# Patient Record
Sex: Male | Born: 1955 | Race: White | Hispanic: No | Marital: Single | Smoking: Current every day smoker
Health system: Southern US, Community
[De-identification: ages and names within clinical notes are randomized; demographics above are authoritative.]

## PROBLEM LIST (undated history)

## (undated) DIAGNOSIS — I10 Essential (primary) hypertension: Secondary | ICD-10-CM

## (undated) DIAGNOSIS — I219 Acute myocardial infarction, unspecified: Secondary | ICD-10-CM

## (undated) DIAGNOSIS — E119 Type 2 diabetes mellitus without complications: Secondary | ICD-10-CM

## (undated) DIAGNOSIS — I509 Heart failure, unspecified: Secondary | ICD-10-CM

## (undated) DIAGNOSIS — J449 Chronic obstructive pulmonary disease, unspecified: Secondary | ICD-10-CM

## (undated) HISTORY — PX: CHOLECYSTECTOMY: SHX55

## (undated) HISTORY — PX: CORONARY ANGIOPLASTY WITH STENT PLACEMENT: SHX49

## (undated) HISTORY — PX: APPENDECTOMY: SHX54

---

## 2004-08-07 ENCOUNTER — Other Ambulatory Visit: Payer: Self-pay

## 2004-08-08 ENCOUNTER — Observation Stay: Payer: Self-pay | Admitting: Internal Medicine

## 2004-08-08 ENCOUNTER — Other Ambulatory Visit: Payer: Self-pay

## 2004-08-16 DIAGNOSIS — I219 Acute myocardial infarction, unspecified: Secondary | ICD-10-CM

## 2004-08-16 HISTORY — DX: Acute myocardial infarction, unspecified: I21.9

## 2005-07-25 ENCOUNTER — Inpatient Hospital Stay: Payer: Self-pay | Admitting: Internal Medicine

## 2005-07-25 ENCOUNTER — Other Ambulatory Visit: Payer: Self-pay

## 2007-12-10 ENCOUNTER — Inpatient Hospital Stay: Payer: Self-pay | Admitting: Internal Medicine

## 2007-12-10 ENCOUNTER — Other Ambulatory Visit: Payer: Self-pay

## 2008-03-30 ENCOUNTER — Emergency Department: Payer: Self-pay | Admitting: Emergency Medicine

## 2008-03-30 ENCOUNTER — Other Ambulatory Visit: Payer: Self-pay

## 2008-04-15 ENCOUNTER — Inpatient Hospital Stay: Payer: Self-pay | Admitting: Surgery

## 2008-04-15 ENCOUNTER — Other Ambulatory Visit: Payer: Self-pay

## 2008-04-23 ENCOUNTER — Other Ambulatory Visit: Payer: Self-pay

## 2008-04-23 ENCOUNTER — Emergency Department: Payer: Self-pay

## 2010-01-08 ENCOUNTER — Emergency Department: Payer: Self-pay | Admitting: Emergency Medicine

## 2010-02-10 ENCOUNTER — Emergency Department: Payer: Self-pay | Admitting: Emergency Medicine

## 2010-04-13 ENCOUNTER — Ambulatory Visit: Payer: Self-pay | Admitting: Family Medicine

## 2010-04-27 ENCOUNTER — Ambulatory Visit: Payer: Self-pay | Admitting: Family Medicine

## 2010-08-03 ENCOUNTER — Ambulatory Visit: Payer: Self-pay | Admitting: Family Medicine

## 2010-09-17 ENCOUNTER — Ambulatory Visit: Payer: Self-pay | Admitting: Family Medicine

## 2010-10-11 ENCOUNTER — Emergency Department: Payer: Self-pay | Admitting: Emergency Medicine

## 2010-12-15 ENCOUNTER — Ambulatory Visit: Payer: Self-pay | Admitting: Internal Medicine

## 2010-12-29 ENCOUNTER — Emergency Department: Payer: Self-pay | Admitting: Emergency Medicine

## 2011-01-12 ENCOUNTER — Ambulatory Visit: Payer: Self-pay | Admitting: Internal Medicine

## 2011-01-15 ENCOUNTER — Ambulatory Visit: Payer: Self-pay | Admitting: Internal Medicine

## 2011-02-07 ENCOUNTER — Emergency Department: Payer: Self-pay | Admitting: Emergency Medicine

## 2011-02-28 ENCOUNTER — Emergency Department: Payer: Self-pay | Admitting: Emergency Medicine

## 2011-05-04 ENCOUNTER — Observation Stay: Payer: Self-pay | Admitting: *Deleted

## 2011-06-15 ENCOUNTER — Ambulatory Visit: Payer: Self-pay | Admitting: Internal Medicine

## 2011-07-13 ENCOUNTER — Emergency Department: Payer: Self-pay | Admitting: Emergency Medicine

## 2011-07-14 ENCOUNTER — Ambulatory Visit: Payer: Self-pay | Admitting: Internal Medicine

## 2011-07-17 ENCOUNTER — Ambulatory Visit: Payer: Self-pay | Admitting: Internal Medicine

## 2011-08-17 ENCOUNTER — Ambulatory Visit: Payer: Self-pay | Admitting: Internal Medicine

## 2011-11-02 ENCOUNTER — Ambulatory Visit: Payer: Self-pay | Admitting: Internal Medicine

## 2011-11-02 LAB — CBC CANCER CENTER
Basophil #: 0.1 x10 3/mm (ref 0.0–0.1)
Basophil %: 0.8 %
Eosinophil %: 1.6 %
HGB: 13.3 g/dL (ref 13.0–18.0)
Lymphocyte #: 3.8 x10 3/mm — ABNORMAL HIGH (ref 1.0–3.6)
MCH: 28.7 pg (ref 26.0–34.0)
MCHC: 33.9 g/dL (ref 32.0–36.0)
MCV: 85 fL (ref 80–100)
Monocyte #: 1.4 x10 3/mm — ABNORMAL HIGH (ref 0.0–0.7)
Monocyte %: 9.6 %
Neutrophil %: 61.1 %

## 2011-11-04 ENCOUNTER — Emergency Department: Payer: Self-pay | Admitting: Emergency Medicine

## 2011-11-04 LAB — COMPREHENSIVE METABOLIC PANEL
Alkaline Phosphatase: 121 U/L (ref 50–136)
BUN: 3 mg/dL — ABNORMAL LOW (ref 7–18)
Calcium, Total: 8.4 mg/dL — ABNORMAL LOW (ref 8.5–10.1)
Chloride: 98 mmol/L (ref 98–107)
Co2: 26 mmol/L (ref 21–32)
EGFR (Non-African Amer.): >60
Potassium: 3.4 mmol/L — ABNORMAL LOW (ref 3.5–5.1)
SGOT(AST): 59 U/L — ABNORMAL HIGH (ref 15–37)
SGPT (ALT): 45 U/L
Total Protein: 7.4 g/dL (ref 6.4–8.2)

## 2011-11-04 LAB — CBC
HCT: 37.2 % — ABNORMAL LOW (ref 40.0–52.0)
MCHC: 33.8 g/dL (ref 32.0–36.0)
Platelet: 251 10*3/uL (ref 150–440)
WBC: 8.9 10*3/uL (ref 3.8–10.6)

## 2011-11-04 LAB — CK TOTAL AND CKMB (NOT AT ARMC)
CK, Total: 87 U/L (ref 35–232)
CK-MB: 0.8 ng/mL (ref 0.5–3.6)

## 2011-11-04 LAB — TROPONIN I: Troponin-I: <0.02 ng/mL

## 2011-11-07 ENCOUNTER — Emergency Department: Payer: Self-pay | Admitting: Emergency Medicine

## 2011-11-07 LAB — COMPREHENSIVE METABOLIC PANEL
Calcium, Total: 8.4 mg/dL — ABNORMAL LOW (ref 8.5–10.1)
Chloride: 97 mmol/L — ABNORMAL LOW (ref 98–107)
Co2: 27 mmol/L (ref 21–32)
EGFR (African American): >60
EGFR (Non-African Amer.): >60
Potassium: 3.6 mmol/L (ref 3.5–5.1)
SGOT(AST): 49 U/L — ABNORMAL HIGH (ref 15–37)
SGPT (ALT): 46 U/L

## 2011-11-07 LAB — CBC
HCT: 40.7 % (ref 40.0–52.0)
HGB: 13.6 g/dL (ref 13.0–18.0)
MCV: 85 fL (ref 80–100)
RBC: 4.78 10*6/uL (ref 4.40–5.90)
RDW: 15 % — ABNORMAL HIGH (ref 11.5–14.5)
WBC: 14.5 10*3/uL — ABNORMAL HIGH (ref 3.8–10.6)

## 2011-11-07 LAB — CK TOTAL AND CKMB (NOT AT ARMC)
CK, Total: 65 U/L (ref 35–232)
CK-MB: 0.9 ng/mL (ref 0.5–3.6)

## 2011-11-07 LAB — PRO B NATRIURETIC PEPTIDE: B-Type Natriuretic Peptide: 43 pg/mL (ref 0–125)

## 2011-11-08 ENCOUNTER — Emergency Department: Payer: Self-pay | Admitting: Emergency Medicine

## 2011-11-15 ENCOUNTER — Ambulatory Visit: Payer: Self-pay | Admitting: Internal Medicine

## 2011-11-21 ENCOUNTER — Emergency Department: Payer: Self-pay | Admitting: Emergency Medicine

## 2011-11-21 LAB — COMPREHENSIVE METABOLIC PANEL
Anion Gap: 11 (ref 7–16)
Bilirubin,Total: 0.3 mg/dL (ref 0.2–1.0)
Calcium, Total: 8.5 mg/dL (ref 8.5–10.1)
Co2: 26 mmol/L (ref 21–32)
Creatinine: 0.78 mg/dL (ref 0.60–1.30)
EGFR (Non-African Amer.): >60
Osmolality: 272 (ref 275–301)
Potassium: 3.5 mmol/L (ref 3.5–5.1)
SGOT(AST): 58 U/L — ABNORMAL HIGH (ref 15–37)
Sodium: 136 mmol/L (ref 136–145)

## 2011-11-21 LAB — CK TOTAL AND CKMB (NOT AT ARMC)
CK, Total: 85 U/L (ref 35–232)
CK-MB: 0.6 ng/mL (ref 0.5–3.6)

## 2011-11-21 LAB — CBC WITH DIFFERENTIAL/PLATELET
Eosinophil #: 0.4 10*3/uL (ref 0.0–0.7)
Eosinophil %: 2.3 %
HCT: 38.4 % — ABNORMAL LOW (ref 40.0–52.0)
HGB: 12.9 g/dL — ABNORMAL LOW (ref 13.0–18.0)
Lymphocyte #: 4.5 10*3/uL — ABNORMAL HIGH (ref 1.0–3.6)
Lymphocyte %: 29.9 %
MCH: 28.7 pg (ref 26.0–34.0)
MCV: 85 fL (ref 80–100)
Monocyte %: 11.1 %
Neutrophil #: 8.5 10*3/uL — ABNORMAL HIGH (ref 1.4–6.5)
Neutrophil %: 56.1 %
Platelet: 286 10*3/uL (ref 150–440)
RBC: 4.5 10*6/uL (ref 4.40–5.90)
RDW: 14.6 % — ABNORMAL HIGH (ref 11.5–14.5)

## 2012-01-03 ENCOUNTER — Emergency Department: Payer: Self-pay | Admitting: *Deleted

## 2012-01-03 LAB — CBC
HCT: 41.8 % (ref 40.0–52.0)
HGB: 13.7 g/dL (ref 13.0–18.0)
MCHC: 32.7 g/dL (ref 32.0–36.0)
MCV: 86 fL (ref 80–100)
Platelet: 386 10*3/uL (ref 150–440)
RDW: 14.7 % — ABNORMAL HIGH (ref 11.5–14.5)
WBC: 22.6 10*3/uL — ABNORMAL HIGH (ref 3.8–10.6)

## 2012-01-03 LAB — BASIC METABOLIC PANEL
Anion Gap: 8 (ref 7–16)
BUN: 5 mg/dL — ABNORMAL LOW (ref 7–18)
Chloride: 107 mmol/L (ref 98–107)
Co2: 25 mmol/L (ref 21–32)
Creatinine: 0.93 mg/dL (ref 0.60–1.30)
Glucose: 127 mg/dL — ABNORMAL HIGH (ref 65–99)
Sodium: 140 mmol/L (ref 136–145)

## 2012-01-03 LAB — TROPONIN I: Troponin-I: <0.02 ng/mL

## 2012-01-03 LAB — CK TOTAL AND CKMB (NOT AT ARMC): CK-MB: 0.6 ng/mL (ref 0.5–3.6)

## 2012-01-21 ENCOUNTER — Emergency Department: Payer: Self-pay | Admitting: Emergency Medicine

## 2012-01-21 LAB — URINALYSIS, COMPLETE
Bacteria: NONE SEEN
Bilirubin,UR: NEGATIVE
Blood: NEGATIVE
Glucose,UR: NEGATIVE mg/dL
Ketone: NEGATIVE
Leukocyte Esterase: NEGATIVE
Nitrite: NEGATIVE
Ph: 6
Protein: NEGATIVE
RBC,UR: 1 /HPF
Specific Gravity: 1.001
Squamous Epithelial: NONE SEEN
WBC UR: NONE SEEN /HPF

## 2012-01-21 LAB — CBC WITH DIFFERENTIAL/PLATELET
Basophil %: 0.8 %
Eosinophil %: 1.2 %
Lymphocyte %: 26.5 %
MCHC: 33.7 g/dL (ref 32.0–36.0)
Monocyte #: 1 x10 3/mm (ref 0.2–1.0)
Neutrophil #: 9.4 10*3/uL — ABNORMAL HIGH (ref 1.4–6.5)
Neutrophil %: 64.7 %
Platelet: 353 10*3/uL (ref 150–440)
RBC: 4.79 10*6/uL (ref 4.40–5.90)
RDW: 14.4 % (ref 11.5–14.5)
WBC: 14.6 10*3/uL — ABNORMAL HIGH (ref 3.8–10.6)

## 2012-01-21 LAB — BASIC METABOLIC PANEL
Anion Gap: 13 (ref 7–16)
BUN: 2 mg/dL — ABNORMAL LOW (ref 7–18)
Chloride: 102 mmol/L (ref 98–107)
Co2: 22 mmol/L (ref 21–32)
Creatinine: 0.79 mg/dL (ref 0.60–1.30)
EGFR (African American): >60
EGFR (Non-African Amer.): >60
Osmolality: 272 (ref 275–301)
Potassium: 4 mmol/L (ref 3.5–5.1)
Sodium: 137 mmol/L (ref 136–145)

## 2012-06-14 ENCOUNTER — Emergency Department: Payer: Self-pay | Admitting: Unknown Physician Specialty

## 2012-06-14 LAB — COMPREHENSIVE METABOLIC PANEL
Albumin: 3.9 g/dL (ref 3.4–5.0)
Alkaline Phosphatase: 104 U/L (ref 50–136)
Anion Gap: 14 (ref 7–16)
BUN: 4 mg/dL — ABNORMAL LOW (ref 7–18)
Bilirubin,Total: 0.4 mg/dL (ref 0.2–1.0)
Calcium, Total: 8.3 mg/dL — ABNORMAL LOW (ref 8.5–10.1)
Chloride: 93 mmol/L — ABNORMAL LOW (ref 98–107)
Co2: 21 mmol/L (ref 21–32)
Creatinine: 0.7 mg/dL (ref 0.60–1.30)
EGFR (African American): >60
EGFR (Non-African Amer.): >60
Glucose: 164 mg/dL — ABNORMAL HIGH (ref 65–99)
Osmolality: 258 (ref 275–301)
Potassium: 3.2 mmol/L — ABNORMAL LOW (ref 3.5–5.1)
SGOT(AST): 29 U/L (ref 15–37)
SGPT (ALT): 23 U/L (ref 12–78)
Sodium: 128 mmol/L — ABNORMAL LOW (ref 136–145)
Total Protein: 7.4 g/dL (ref 6.4–8.2)

## 2012-06-14 LAB — CBC
HGB: 13.9 g/dL (ref 13.0–18.0)
MCV: 81 fL (ref 80–100)
Platelet: 354 10*3/uL (ref 150–440)
RDW: 14.9 % — ABNORMAL HIGH (ref 11.5–14.5)

## 2012-06-14 LAB — PRO B NATRIURETIC PEPTIDE: B-Type Natriuretic Peptide: 34 pg/mL (ref 0–125)

## 2012-06-14 LAB — TROPONIN I: Troponin-I: <0.02 ng/mL

## 2012-07-03 ENCOUNTER — Emergency Department: Payer: Self-pay | Admitting: Emergency Medicine

## 2012-07-03 LAB — CBC
HGB: 13.5 g/dL (ref 13.0–18.0)
MCH: 28.2 pg (ref 26.0–34.0)
MCHC: 34.2 g/dL (ref 32.0–36.0)
Platelet: 325 10*3/uL (ref 150–440)
RBC: 4.79 10*6/uL (ref 4.40–5.90)
WBC: 13.8 10*3/uL — ABNORMAL HIGH (ref 3.8–10.6)

## 2012-07-03 LAB — URINALYSIS, COMPLETE
Bilirubin,UR: NEGATIVE
Blood: NEGATIVE
Ketone: NEGATIVE
Nitrite: NEGATIVE
Protein: NEGATIVE
RBC,UR: NONE SEEN /HPF (ref 0–5)
Squamous Epithelial: NONE SEEN
WBC UR: <1 /HPF (ref 0–5)

## 2012-07-03 LAB — BASIC METABOLIC PANEL
BUN: 2 mg/dL — ABNORMAL LOW (ref 7–18)
Chloride: 99 mmol/L (ref 98–107)
Co2: 28 mmol/L (ref 21–32)
Creatinine: 0.65 mg/dL (ref 0.60–1.30)
EGFR (Non-African Amer.): >60
Potassium: 3.5 mmol/L (ref 3.5–5.1)
Sodium: 132 mmol/L — ABNORMAL LOW (ref 136–145)

## 2012-07-03 LAB — CK TOTAL AND CKMB (NOT AT ARMC): CK-MB: <0.5 ng/mL — ABNORMAL LOW (ref 0.5–3.6)

## 2012-07-03 LAB — TROPONIN I: Troponin-I: <0.02 ng/mL

## 2013-02-21 ENCOUNTER — Ambulatory Visit: Payer: Self-pay | Admitting: Pain Medicine

## 2013-04-26 ENCOUNTER — Observation Stay: Payer: Self-pay | Admitting: Internal Medicine

## 2013-04-26 LAB — COMPREHENSIVE METABOLIC PANEL
Albumin: 3.6 g/dL (ref 3.4–5.0)
BUN: 4 mg/dL — ABNORMAL LOW (ref 7–18)
Calcium, Total: 8.6 mg/dL (ref 8.5–10.1)
Chloride: 102 mmol/L (ref 98–107)
Creatinine: 0.73 mg/dL (ref 0.60–1.30)
EGFR (African American): >60
EGFR (Non-African Amer.): >60
Glucose: 105 mg/dL — ABNORMAL HIGH (ref 65–99)
Osmolality: 266 (ref 275–301)
SGOT(AST): 29 U/L (ref 15–37)
SGPT (ALT): 18 U/L (ref 12–78)
Sodium: 134 mmol/L — ABNORMAL LOW (ref 136–145)

## 2013-04-26 LAB — URINALYSIS, COMPLETE
Bacteria: NONE SEEN
Bilirubin,UR: NEGATIVE
Blood: NEGATIVE
Ketone: NEGATIVE
Nitrite: NEGATIVE
Protein: NEGATIVE
RBC,UR: NONE SEEN /HPF (ref 0–5)
Specific Gravity: 1.003 (ref 1.003–1.030)
WBC UR: <1 /HPF (ref 0–5)

## 2013-04-26 LAB — ETHANOL
Ethanol %: <0.003 % (ref 0.000–0.080)
Ethanol: <3 mg/dL

## 2013-04-26 LAB — CBC
HCT: 42.6 % (ref 40.0–52.0)
HGB: 14.3 g/dL (ref 13.0–18.0)
MCH: 27.9 pg (ref 26.0–34.0)
MCV: 83 fL (ref 80–100)
WBC: 14.8 10*3/uL — ABNORMAL HIGH (ref 3.8–10.6)

## 2013-04-26 LAB — DRUG SCREEN, URINE
Amphetamines, Ur Screen: NEGATIVE (ref ?–1000)
Benzodiazepine, Ur Scrn: POSITIVE (ref ?–200)
Cannabinoid 50 Ng, Ur ~~LOC~~: NEGATIVE (ref ?–50)
MDMA (Ecstasy)Ur Screen: NEGATIVE (ref ?–500)
Methadone, Ur Screen: NEGATIVE (ref ?–300)
Opiate, Ur Screen: NEGATIVE (ref ?–300)
Phencyclidine (PCP) Ur S: NEGATIVE (ref ?–25)
Tricyclic, Ur Screen: POSITIVE (ref ?–1000)

## 2013-04-26 LAB — CK TOTAL AND CKMB (NOT AT ARMC)
CK, Total: 132 U/L (ref 35–232)
CK, Total: 86 U/L (ref 35–232)
CK, Total: 84 U/L (ref 35–232)
CK-MB: <0.5 ng/mL — ABNORMAL LOW (ref 0.5–3.6)

## 2013-04-26 LAB — PROTIME-INR
INR: 1
Prothrombin Time: 13 secs (ref 11.5–14.7)

## 2013-05-28 ENCOUNTER — Emergency Department: Payer: Self-pay | Admitting: Emergency Medicine

## 2013-05-28 LAB — BASIC METABOLIC PANEL
Anion Gap: 8 (ref 7–16)
Calcium, Total: 8.3 mg/dL — ABNORMAL LOW (ref 8.5–10.1)
Creatinine: 0.75 mg/dL (ref 0.60–1.30)
EGFR (African American): >60
EGFR (Non-African Amer.): >60
Osmolality: 265 (ref 275–301)
Potassium: 3.7 mmol/L (ref 3.5–5.1)

## 2013-05-28 LAB — CBC
HCT: 40.1 % (ref 40.0–52.0)
HGB: 13.6 g/dL (ref 13.0–18.0)
MCV: 82 fL (ref 80–100)
Platelet: 344 10*3/uL (ref 150–440)
RDW: 15.1 % — ABNORMAL HIGH (ref 11.5–14.5)

## 2013-06-10 ENCOUNTER — Emergency Department: Payer: Self-pay | Admitting: Emergency Medicine

## 2013-06-10 LAB — COMPREHENSIVE METABOLIC PANEL
Albumin: 3.4 g/dL (ref 3.4–5.0)
Alkaline Phosphatase: 120 U/L (ref 50–136)
Anion Gap: 5 — ABNORMAL LOW (ref 7–16)
Bilirubin,Total: 0.3 mg/dL (ref 0.2–1.0)
Calcium, Total: 8.5 mg/dL (ref 8.5–10.1)
Osmolality: 259 (ref 275–301)
SGPT (ALT): 12 U/L (ref 12–78)
Total Protein: 7.5 g/dL (ref 6.4–8.2)

## 2013-06-10 LAB — CBC
HCT: 40.1 % (ref 40.0–52.0)
HGB: 13.7 g/dL (ref 13.0–18.0)
MCHC: 34.2 g/dL (ref 32.0–36.0)
Platelet: 495 10*3/uL — ABNORMAL HIGH (ref 150–440)
RBC: 4.94 10*6/uL (ref 4.40–5.90)
RDW: 14.6 % — ABNORMAL HIGH (ref 11.5–14.5)
WBC: 16 10*3/uL — ABNORMAL HIGH (ref 3.8–10.6)

## 2013-12-03 ENCOUNTER — Inpatient Hospital Stay: Payer: Self-pay | Admitting: Internal Medicine

## 2013-12-03 DIAGNOSIS — I369 Nonrheumatic tricuspid valve disorder, unspecified: Secondary | ICD-10-CM

## 2013-12-03 LAB — COMPREHENSIVE METABOLIC PANEL
ALBUMIN: 3.9 g/dL (ref 3.4–5.0)
ALK PHOS: 161 U/L — AB
Anion Gap: 10 (ref 7–16)
BUN: 7 mg/dL (ref 7–18)
Bilirubin,Total: 0.3 mg/dL (ref 0.2–1.0)
CHLORIDE: 101 mmol/L (ref 98–107)
CREATININE: 1.67 mg/dL — AB (ref 0.60–1.30)
Calcium, Total: 7.9 mg/dL — ABNORMAL LOW (ref 8.5–10.1)
Co2: 21 mmol/L (ref 21–32)
EGFR (Non-African Amer.): 44 — ABNORMAL LOW
GFR CALC AF AMER: 51 — AB
GLUCOSE: 125 mg/dL — AB (ref 65–99)
OSMOLALITY: 264 (ref 275–301)
POTASSIUM: 4 mmol/L (ref 3.5–5.1)
SGOT(AST): 181 U/L — ABNORMAL HIGH (ref 15–37)
SGPT (ALT): 111 U/L — ABNORMAL HIGH (ref 12–78)
SODIUM: 132 mmol/L — AB (ref 136–145)
Total Protein: 8 g/dL (ref 6.4–8.2)

## 2013-12-03 LAB — CBC
HCT: 47.8 % (ref 40.0–52.0)
HGB: 15.4 g/dL (ref 13.0–18.0)
MCH: 27.4 pg (ref 26.0–34.0)
MCHC: 32.2 g/dL (ref 32.0–36.0)
MCV: 85 fL (ref 80–100)
Platelet: 327 10*3/uL (ref 150–440)
RBC: 5.61 10*6/uL (ref 4.40–5.90)
RDW: 15.7 % — ABNORMAL HIGH (ref 11.5–14.5)
WBC: 19.5 10*3/uL — AB (ref 3.8–10.6)

## 2013-12-03 LAB — DRUG SCREEN, URINE

## 2013-12-03 LAB — TROPONIN I
Troponin-I: <0.02 ng/mL
Troponin-I: <0.02 ng/mL

## 2013-12-03 LAB — URINALYSIS, COMPLETE
BACTERIA: NONE SEEN
BILIRUBIN, UR: NEGATIVE
Blood: NEGATIVE
Glucose,UR: NEGATIVE mg/dL (ref 0–75)
KETONE: NEGATIVE
Leukocyte Esterase: NEGATIVE
Nitrite: NEGATIVE
PROTEIN: NEGATIVE
Ph: 7 (ref 4.5–8.0)
RBC,UR: NONE SEEN /HPF (ref 0–5)
Specific Gravity: 1.001 (ref 1.003–1.030)
Squamous Epithelial: NONE SEEN
WBC UR: NONE SEEN /HPF (ref 0–5)

## 2013-12-03 LAB — CK: CK, Total: 1548 U/L — ABNORMAL HIGH

## 2013-12-03 LAB — CK TOTAL AND CKMB (NOT AT ARMC)
CK, TOTAL: 15645 U/L — AB
CK, TOTAL: 18778 U/L — AB
CK-MB: 107.6 ng/mL — AB (ref 0.5–3.6)
CK-MB: 107.2 ng/mL — ABNORMAL HIGH (ref 0.5–3.6)

## 2013-12-03 LAB — TSH: Thyroid Stimulating Horm: 2.31 u[IU]/mL

## 2013-12-03 LAB — ETHANOL
ETHANOL %: 0.006 % (ref 0.000–0.080)
Ethanol: 6 mg/dL

## 2013-12-03 LAB — ACETAMINOPHEN LEVEL: Acetaminophen: <2 ug/mL

## 2013-12-03 LAB — CK-MB: CK-MB: 15.5 ng/mL — ABNORMAL HIGH (ref 0.5–3.6)

## 2013-12-03 LAB — SALICYLATE LEVEL: SALICYLATES, SERUM: 3.5 mg/dL — AB

## 2013-12-04 LAB — CBC WITH DIFFERENTIAL/PLATELET
BASOS ABS: 0 10*3/uL (ref 0.0–0.1)
BASOS PCT: 0.1 %
Eosinophil #: 0 10*3/uL (ref 0.0–0.7)
Eosinophil %: 0 %
HCT: 41.2 % (ref 40.0–52.0)
HGB: 13.6 g/dL (ref 13.0–18.0)
LYMPHS ABS: 1.4 10*3/uL (ref 1.0–3.6)
LYMPHS PCT: 6.9 %
MCH: 27.9 pg (ref 26.0–34.0)
MCHC: 32.9 g/dL (ref 32.0–36.0)
MCV: 85 fL (ref 80–100)
MONO ABS: 0.6 x10 3/mm (ref 0.2–1.0)
Monocyte %: 3 %
Neutrophil #: 17.9 10*3/uL — ABNORMAL HIGH (ref 1.4–6.5)
Neutrophil %: 90 %
Platelet: 270 10*3/uL (ref 150–440)
RBC: 4.86 10*6/uL (ref 4.40–5.90)
RDW: 15.3 % — AB (ref 11.5–14.5)
WBC: 19.9 10*3/uL — AB (ref 3.8–10.6)

## 2013-12-04 LAB — COMPREHENSIVE METABOLIC PANEL
ALT: 220 U/L — AB (ref 12–78)
ANION GAP: 8 (ref 7–16)
Albumin: 3.2 g/dL — ABNORMAL LOW (ref 3.4–5.0)
Alkaline Phosphatase: 139 U/L — ABNORMAL HIGH
BUN: 9 mg/dL (ref 7–18)
Bilirubin,Total: 0.4 mg/dL (ref 0.2–1.0)
CHLORIDE: 103 mmol/L (ref 98–107)
Calcium, Total: 7.7 mg/dL — ABNORMAL LOW (ref 8.5–10.1)
Co2: 24 mmol/L (ref 21–32)
Creatinine: 0.8 mg/dL (ref 0.60–1.30)
EGFR (African American): >60
EGFR (Non-African Amer.): >60
Glucose: 149 mg/dL — ABNORMAL HIGH (ref 65–99)
Osmolality: 272 (ref 275–301)
Potassium: 4 mmol/L (ref 3.5–5.1)
SGOT(AST): 477 U/L — ABNORMAL HIGH (ref 15–37)
SODIUM: 135 mmol/L — AB (ref 136–145)
TOTAL PROTEIN: 6.8 g/dL (ref 6.4–8.2)

## 2013-12-04 LAB — CK TOTAL AND CKMB (NOT AT ARMC)
CK, Total: 11566 U/L — ABNORMAL HIGH
CK-MB: 25.5 ng/mL — ABNORMAL HIGH (ref 0.5–3.6)

## 2013-12-04 LAB — PROTIME-INR
INR: 1.1
PROTHROMBIN TIME: 14.4 s (ref 11.5–14.7)

## 2013-12-04 LAB — MAGNESIUM: MAGNESIUM: 2.3 mg/dL

## 2013-12-05 ENCOUNTER — Ambulatory Visit: Payer: Self-pay | Admitting: Neurology

## 2013-12-05 LAB — COMPREHENSIVE METABOLIC PANEL
ALT: 195 U/L — AB (ref 12–78)
AST: 276 U/L — AB (ref 15–37)
Albumin: 3.1 g/dL — ABNORMAL LOW (ref 3.4–5.0)
Alkaline Phosphatase: 118 U/L — ABNORMAL HIGH
Anion Gap: 7 (ref 7–16)
BILIRUBIN TOTAL: 0.4 mg/dL (ref 0.2–1.0)
BUN: 11 mg/dL (ref 7–18)
CHLORIDE: 102 mmol/L (ref 98–107)
CREATININE: 0.66 mg/dL (ref 0.60–1.30)
Calcium, Total: 7.9 mg/dL — ABNORMAL LOW (ref 8.5–10.1)
Co2: 24 mmol/L (ref 21–32)
EGFR (Non-African Amer.): >60
Glucose: 161 mg/dL — ABNORMAL HIGH (ref 65–99)
OSMOLALITY: 269 (ref 275–301)
Potassium: 4 mmol/L (ref 3.5–5.1)
Sodium: 133 mmol/L — ABNORMAL LOW (ref 136–145)
Total Protein: 6.8 g/dL (ref 6.4–8.2)

## 2013-12-05 LAB — CK-MB: CK-MB: 10.4 ng/mL — ABNORMAL HIGH (ref 0.5–3.6)

## 2013-12-05 LAB — CK: CK, Total: 6849 U/L — ABNORMAL HIGH

## 2013-12-06 LAB — BASIC METABOLIC PANEL
Anion Gap: 2 — ABNORMAL LOW (ref 7–16)
BUN: 12 mg/dL (ref 7–18)
CHLORIDE: 103 mmol/L (ref 98–107)
CREATININE: 0.54 mg/dL — AB (ref 0.60–1.30)
Calcium, Total: 8.1 mg/dL — ABNORMAL LOW (ref 8.5–10.1)
Co2: 29 mmol/L (ref 21–32)
EGFR (African American): >60
GLUCOSE: 127 mg/dL — AB (ref 65–99)
OSMOLALITY: 270 (ref 275–301)
Potassium: 4.1 mmol/L (ref 3.5–5.1)
SODIUM: 134 mmol/L — AB (ref 136–145)

## 2013-12-06 LAB — CK: CK, TOTAL: 1704 U/L — AB

## 2014-01-23 ENCOUNTER — Emergency Department: Payer: Self-pay | Admitting: Emergency Medicine

## 2014-01-23 LAB — CBC WITH DIFFERENTIAL/PLATELET
BASOS ABS: 0.1 10*3/uL (ref 0.0–0.1)
Basophil %: 0.4 %
Eosinophil #: 0.2 10*3/uL (ref 0.0–0.7)
Eosinophil %: 1.7 %
HCT: 37.6 % — AB (ref 40.0–52.0)
HGB: 12.2 g/dL — ABNORMAL LOW (ref 13.0–18.0)
LYMPHS ABS: 3.8 10*3/uL — AB (ref 1.0–3.6)
Lymphocyte %: 28.3 %
MCH: 27.4 pg (ref 26.0–34.0)
MCHC: 32.5 g/dL (ref 32.0–36.0)
MCV: 84 fL (ref 80–100)
Monocyte #: 0.7 x10 3/mm (ref 0.2–1.0)
Monocyte %: 5.6 %
NEUTROS ABS: 8.5 10*3/uL — AB (ref 1.4–6.5)
Neutrophil %: 64 %
Platelet: 339 10*3/uL (ref 150–440)
RBC: 4.46 10*6/uL (ref 4.40–5.90)
RDW: 14.7 % — ABNORMAL HIGH (ref 11.5–14.5)
WBC: 13.4 10*3/uL — AB (ref 3.8–10.6)

## 2014-01-23 LAB — URINALYSIS, COMPLETE
BILIRUBIN, UR: NEGATIVE
BLOOD: NEGATIVE
Bacteria: NONE SEEN
Glucose,UR: NEGATIVE mg/dL (ref 0–75)
Ketone: NEGATIVE
Leukocyte Esterase: NEGATIVE
Nitrite: NEGATIVE
Ph: 7 (ref 4.5–8.0)
Protein: NEGATIVE
RBC,UR: 1 /HPF (ref 0–5)
Specific Gravity: 1.001 (ref 1.003–1.030)
WBC UR: <1 /HPF (ref 0–5)

## 2014-01-23 LAB — BASIC METABOLIC PANEL
ANION GAP: 8 (ref 7–16)
BUN: 3 mg/dL — ABNORMAL LOW (ref 7–18)
Calcium, Total: 8.5 mg/dL (ref 8.5–10.1)
Chloride: 99 mmol/L (ref 98–107)
Co2: 25 mmol/L (ref 21–32)
Creatinine: 0.78 mg/dL (ref 0.60–1.30)
EGFR (African American): >60
EGFR (Non-African Amer.): >60
Glucose: 211 mg/dL — ABNORMAL HIGH (ref 65–99)
Osmolality: 267 (ref 275–301)
POTASSIUM: 3 mmol/L — AB (ref 3.5–5.1)
Sodium: 132 mmol/L — ABNORMAL LOW (ref 136–145)

## 2014-01-23 LAB — TROPONIN I

## 2014-05-09 LAB — URINALYSIS, COMPLETE
BLOOD: NEGATIVE
Bacteria: NONE SEEN
Bilirubin,UR: NEGATIVE
GLUCOSE, UR: NEGATIVE mg/dL (ref 0–75)
KETONE: NEGATIVE
Leukocyte Esterase: NEGATIVE
NITRITE: NEGATIVE
PH: 7 (ref 4.5–8.0)
Protein: NEGATIVE
RBC,UR: NONE SEEN /HPF (ref 0–5)
Specific Gravity: 1.001 (ref 1.003–1.030)
Squamous Epithelial: NONE SEEN
WBC UR: NONE SEEN /HPF (ref 0–5)

## 2014-05-09 LAB — CBC WITH DIFFERENTIAL/PLATELET
BASOS PCT: 0.5 %
Basophil #: 0.1 10*3/uL (ref 0.0–0.1)
Eosinophil #: 0.3 10*3/uL (ref 0.0–0.7)
Eosinophil %: 2.3 %
HCT: 39.7 % — ABNORMAL LOW (ref 40.0–52.0)
HGB: 13 g/dL (ref 13.0–18.0)
Lymphocyte #: 4.3 10*3/uL — ABNORMAL HIGH (ref 1.0–3.6)
Lymphocyte %: 29.6 %
MCH: 27 pg (ref 26.0–34.0)
MCHC: 32.7 g/dL (ref 32.0–36.0)
MCV: 83 fL (ref 80–100)
MONO ABS: 1.4 x10 3/mm — AB (ref 0.2–1.0)
MONOS PCT: 9.7 %
NEUTROS ABS: 8.5 10*3/uL — AB (ref 1.4–6.5)
Neutrophil %: 57.9 %
Platelet: 352 10*3/uL (ref 150–440)
RBC: 4.81 10*6/uL (ref 4.40–5.90)
RDW: 14.5 % (ref 11.5–14.5)
WBC: 14.7 10*3/uL — AB (ref 3.8–10.6)

## 2014-05-09 LAB — COMPREHENSIVE METABOLIC PANEL
ANION GAP: 7 (ref 7–16)
AST: 13 U/L — AB (ref 15–37)
Albumin: 3.2 g/dL — ABNORMAL LOW (ref 3.4–5.0)
Alkaline Phosphatase: 139 U/L — ABNORMAL HIGH
BUN: 7 mg/dL (ref 7–18)
Bilirubin,Total: 0.2 mg/dL (ref 0.2–1.0)
CALCIUM: 8.3 mg/dL — AB (ref 8.5–10.1)
CHLORIDE: 104 mmol/L (ref 98–107)
CO2: 27 mmol/L (ref 21–32)
Creatinine: 0.69 mg/dL (ref 0.60–1.30)
EGFR (African American): >60
EGFR (Non-African Amer.): >60
GLUCOSE: 96 mg/dL (ref 65–99)
Osmolality: 274 (ref 275–301)
Potassium: 3.3 mmol/L — ABNORMAL LOW (ref 3.5–5.1)
SGPT (ALT): 11 U/L — ABNORMAL LOW
Sodium: 138 mmol/L (ref 136–145)
TOTAL PROTEIN: 7.3 g/dL (ref 6.4–8.2)

## 2014-05-09 LAB — TROPONIN I: Troponin-I: <0.02 ng/mL

## 2014-05-09 LAB — DRUG SCREEN, URINE
Amphetamines, Ur Screen: NEGATIVE (ref ?–1000)
BARBITURATES, UR SCREEN: NEGATIVE (ref ?–200)
Benzodiazepine, Ur Scrn: POSITIVE (ref ?–200)
CANNABINOID 50 NG, UR ~~LOC~~: NEGATIVE (ref ?–50)
COCAINE METABOLITE, UR ~~LOC~~: NEGATIVE (ref ?–300)
MDMA (ECSTASY) UR SCREEN: NEGATIVE (ref ?–500)
METHADONE, UR SCREEN: NEGATIVE (ref ?–300)
Opiate, Ur Screen: NEGATIVE (ref ?–300)
Phencyclidine (PCP) Ur S: NEGATIVE (ref ?–25)
Tricyclic, Ur Screen: POSITIVE (ref ?–1000)

## 2014-05-09 LAB — ETHANOL: Ethanol: <3 mg/dL

## 2014-05-09 LAB — TSH: Thyroid Stimulating Horm: 1.81 u[IU]/mL

## 2014-05-09 LAB — PRO B NATRIURETIC PEPTIDE: B-Type Natriuretic Peptide: 99 pg/mL (ref 0–125)

## 2014-05-10 ENCOUNTER — Inpatient Hospital Stay: Payer: Self-pay | Admitting: Specialist

## 2014-05-11 LAB — COMPREHENSIVE METABOLIC PANEL
ANION GAP: 5 — AB (ref 7–16)
Albumin: 2.7 g/dL — ABNORMAL LOW (ref 3.4–5.0)
Alkaline Phosphatase: 107 U/L
BUN: 8 mg/dL (ref 7–18)
CALCIUM: 8.1 mg/dL — AB (ref 8.5–10.1)
Chloride: 105 mmol/L (ref 98–107)
Co2: 29 mmol/L (ref 21–32)
Creatinine: 0.69 mg/dL (ref 0.60–1.30)
EGFR (African American): >60
GLUCOSE: 106 mg/dL — AB (ref 65–99)
Osmolality: 276 (ref 275–301)
Potassium: 3.8 mmol/L (ref 3.5–5.1)
SGOT(AST): 12 U/L — ABNORMAL LOW (ref 15–37)
SGPT (ALT): 12 U/L — ABNORMAL LOW
SODIUM: 139 mmol/L (ref 136–145)
Total Protein: 6.2 g/dL — ABNORMAL LOW (ref 6.4–8.2)

## 2014-05-11 LAB — CBC WITH DIFFERENTIAL/PLATELET
BASOS ABS: 0 10*3/uL (ref 0.0–0.1)
Basophil %: 0.1 %
Eosinophil #: 0 10*3/uL (ref 0.0–0.7)
Eosinophil %: 0.1 %
HCT: 36.3 % — ABNORMAL LOW (ref 40.0–52.0)
HGB: 11.8 g/dL — ABNORMAL LOW (ref 13.0–18.0)
LYMPHS ABS: 3.9 10*3/uL — AB (ref 1.0–3.6)
LYMPHS PCT: 15.5 %
MCH: 27 pg (ref 26.0–34.0)
MCHC: 32.6 g/dL (ref 32.0–36.0)
MCV: 83 fL (ref 80–100)
Monocyte #: 1.8 x10 3/mm — ABNORMAL HIGH (ref 0.2–1.0)
Monocyte %: 7.4 %
NEUTROS ABS: 19.1 10*3/uL — AB (ref 1.4–6.5)
Neutrophil %: 76.9 %
Platelet: 361 10*3/uL (ref 150–440)
RBC: 4.38 10*6/uL — AB (ref 4.40–5.90)
RDW: 14.7 % — ABNORMAL HIGH (ref 11.5–14.5)
WBC: 24.8 10*3/uL — ABNORMAL HIGH (ref 3.8–10.6)

## 2014-12-06 NOTE — Discharge Summary (Signed)
PATIENT NAME:  Kristopher Powell, Kristopher Powell MR#:  [id number] DATE OF BIRTH:  September 24, 1955  DATE OF ADMISSION:  04/26/2013 DATE OF DISCHARGE:  04/26/2013  DISCHARGE DIAGNOSES: 1.  Acute bronchitis on antibiotics.  2. Atypical chest pain, noncardiac with negative cardiac enzymes. Could be from acute bronchitis.  3.  Chronic knee and back pain with some acute worsening. Recommended outpatient followup with pain management and orthopedics.  4.  Left-sided lumbar pain. The patient not agreeable to get further workup while here in the hospital and wants to go home with outpatient followup with his regular doctors.   SECONDARY DIAGNOSES: 1.  Coronary artery disease, status post stenting.  2.  Diabetes.  3.  History of congestive heart failure with ejection fraction of 55%.  4.  History of atrial fibrillation, not on anticoagulation.  5.  Chronic obstructive pulmonary disease.  6.  Chronic pain.    CONSULTATIONS: None.   PROCEDURES AND RADIOLOGY:  Chest x-ray on 11th of September showed atelectasis.   Lumbar spine x-ray on 11th of September showed no evidence of compression fracture. Possible degenerative changes between L4-L5.   UA on admission was negative.   HISTORY AND SHORT HOSPITAL COURSE:  The patient is a 59 year old male with above-mentioned medical problems was admitted for atypical chest pain. He was ruled out with 3 negative serial cardiac enzymes. His urine toxicology screen was positive for benzodiazepine and TCA. The patient remained chest pain-free. He did complain of some left-sided lumbar pain and had difficulty working with physical therapy, was also found to have multiple falls at home.  His strength was 3 to 4 out of 5 throughout both of his lower extremities and he was recommended to have an orthopedic evaluation, but patient refused and wanted to go see his regular outpatient doctor, for which he was discharged with outpatient followup with his PCP.   On the date of discharge, his vital  signs were as follows: Temperature 98.4, heart rate 92 per minute, respirations 22 per minute, blood pressure 132/89 and he was saturating 98% on room air.   PERTINENT PHYSICAL EXAMINATION ON THE DATE OF DISCHARGE:   CARDIOVASCULAR: S1, S2 normal. No murmurs, rubs or gallop.  LUNGS: Clear to auscultation bilaterally. No wheezing, rales, rhonchi or crepitation.  ABDOMEN: Soft, benign.  NEUROLOGIC: Nonfocal examination.  All other physical examination remained at baseline.   DISCHARGE MEDICATIONS: 1.  Nexium 40 mg p.o. daily.  2.  Proventil 2 puffs inhaled 4 times a day.  3.  Amlodipine 5 mg p.o. daily.  4.  Risperidone 2 mg p.o. at bedtime.  5.  Imipramine 50 mg p.o. every morning and 4 tablets of 50 mg at bedtime.  6.  Trazodone 50 mg 1 to 2 tablets p.o. at bedtime.  7.  Diazepam 10 mg p.o. 3 times a day.  8.  Zolpidem 10 mg 1/2 tablet to 1 tablet p.o. at bedtime.  9.  Spiriva once daily.  10.  Trilipix 135 mg p.o. daily.  11.  Metformin 500 mg p.o. b.i.d.  12.  Atorvastatin 80 mg p.o. at bedtime.  13.  Cyclobenzaprine 10 mg p.o. 3 times a day.  14.  Levaquin 750 mg p.o. daily for 5 days.   DISCHARGE DIET: Low sodium, low fat, low cholesterol.   DISCHARGE ACTIVITY: As tolerated.   DISCHARGE INSTRUCTIONS AND FOLLOWUP:  The patient was instructed to follow up with his primary care physician at [facility] family medicine,  Dr. Pamala Duffel. He will need follow up with [facility]ology in  2 to 4 weeks, with [facility] in 4 to 6 weeks. He was also instructed [facility] in 1 to 2 weeks.   TOTAL TIME DISCHARGING THIS PATIENT:  Was 45 minutes.    ____________________________ Ellamae SiaVipul S. Sherryll BurgerShah, MD vss:nts D: 04/26/2013 23:10:00 ET T: 04/26/2013 23:33:32 ET JOB#: [id number]  cc: [facility] Beckie Viscardi S. Sherryll BurgerShah, MD, <Dictator> [facility], Dr. Graylon GoodBarzin  Kiano Terrien S A Rosie PlaceHAH MD ELECTRONICALLY SIGNED 04/27/2013 22:47

## 2014-12-06 NOTE — H&P (Signed)
PATIENT NAME:  Kristopher Powell, Kristopher Powell MR#:  [id number] DATE OF BIRTH:  02-02-1956  DATE OF ADMISSION:  04/26/2013  REFERRING PHYSICIAN: Dr. Manson Passey.   PRIMARY CARE PHYSICIAN: [facility].   HISTORY OF PRESENT ILLNESS: This is a 59 year old Caucasian gentleman with past medical history of coronary artery disease, status post PCI with stenting many years ago, COPD, on supplemental nasal cannula with 3.5 L at baseline, continued tobacco abuse, type 2 diabetes, paroxysmal atrial fibrillation, chronic knee and back pain secondary to osteoarthritis, who is presenting with intermittent chest pain at rest for the last 3 days. He describes it being retrosternal in nature and radiated across his entire chest crossing midline onto both shoulders bilaterally. There are no worsening or relieving factors. No associated symptoms including change in baseline shortness of breath. He also denied fevers, palpitations, dyspnea on exertion, orthopnea or lower extremity edema. All symptoms, as far as the chest pain is concerned, resolved in the Emergency Department. He is complaining more so of this chronic low back and knee pain, which he used to follow at a pain clinic. Apparently, his last given narcotics back in October 2013. He is also concerned about getting a ride home from the [facility] ED physician is concerned for a cardiac cause of chest pain and asked that the patient be admitted.   REVIEW OF SYSTEMS: CONSTITUTIONAL: Denies fevers, fatigue or weight changes. EYES: Denies blurred vision or eye pain. ENT: Denies ear pain or tinnitus. RESPIRATORY: Describes chronic shortness of breath on oxygen therapy with no change. CARDIOVASCULAR: Chest pain as described above without edema or palpitations.  GASTROINTESTINAL: No nausea, vomiting or diarrhea. GENITOURINARY: No dysuria or hematuria. ENDOCRINE: Denies any nocturia or thyroid problems. HEMATOLOGY/LYMPHATICS: Denies easy bruising or bleeding. SKIN: Denies rashes or lesions.  MUSCULOSKELETAL: Positive for bilateral knee pain as well as lower back pain chronic in nature, which has limited his activity and mobility. NEUROLOGIC: Denies paralysis or paresthesias. PSYCHIATRIC: Denies anxiety or depression.   PAST MEDICAL HISTORY: 1.  Coronary artery disease, status post stenting. 2.  Type 2 diabetes. 3.  History of congestive heart failure with the last documented ejection fraction of 55% and no mention of diastolic dysfunction. 4.  History of atrial fibrillation and was not on anticoagulation.  5.  COPD, on chronic oxygen therapy at 3 to 3.5 L nasal cannula at baseline. 6.  History of alcohol abuse. 7.  History of tobacco abuse. 8.  Chronic pain secondary to osteoarthritis of the knees as well as back.   SOCIAL HISTORY: Chronic tobacco abuse, previously smoking approximately 2-1/2 packs daily. He says he has cut down somewhere in the 1 pack range. Denies any alcohol or drug usage. He uses a combination of walker, wheelchair an electric scooter for mobility. He states that he cannot walk at baseline, which is has been his baseline for approximately 3 years.   FAMILY HISTORY: He mentions multiple family members, who died before the age of 39 secondary to heart disease.   ALLERGIES: No known drug allergies.   HOME MEDICATIONS: Norvasc 5 mg p.o. daily, atorvastatin 80 mg p.o. at bedtime, diazepam 10 mg p.o. t.i.d., imipramine 200 mg 1 tab in the morning and 4 tabs at night, metformin 500 mg p.o. b.i.d., Nexium 40 mg p.o. daily, Proventil 90 mcg inhalation 2 puffs 4 times daily, risperidone 2 mg at bedtime, Spiriva 18 mcg inhalation daily, trazodone 50 mg at bedtime,   Trilipix 135 mg p.o. daily and Ambien 10 mg p.o. at bedtime.   PHYSICAL  EXAMINATION:  VITAL SIGNS: Temperature 97.8, pulse 90, respirations 25, blood pressure 132/78, saturating 95% on 3 L nasal cannula, BMI 35.3.  GENERAL: No acute distress. Awake, alert, oriented x 3, though does appear disheveled and  unkempt.  HEENT: Normocephalic, atraumatic. Extraocular muscles intact. Pupils are equal, round and reactive to light as well as accommodation. Poor oral dentition. Moist mucosal membranes.  CARDIOVASCULAR: S1, S2, regular rate and rhythm. No murmurs, rubs, or gallops.  PULMONARY: Clear to auscultation bilaterally without wheezes, rubs or rhonchi. He is on 3 L nasal cannula. No retractions.  ABDOMEN: Obese, soft, nontender, nondistended, positive bowel sounds.  EXTREMITIES: Reveal no cyanosis, edema or clubbing. He has bilateral crepitus of the knees on extension and flexion.  NEUROLOGIC: Cranial nerves II through XII intact. He has 4 out of 5 strength in the lower extremities and states secondary to pain, which is chronic in nature.   LABORATORY DATA: Sodium 134, potassium 4.2, chloride 102, bicarbonate 25, BUN 4, creatinine 0.73, glucose 105. Troponin I less than 0.02, CK-MB less than 0.5. Urine drug screen positive for benzos as well as tricyclic antidepressants, WBC 78.214.8, hemoglobin 14.3, platelets of 304.  INR 1. Urinalysis negative for signs of infection. EKG reveals old anterolateral T inversions.   ASSESSMENT AND PLAN: This is a 59 year old gentleman with history of coronary artery disease with remote history of stenting, chronic obstructive pulmonary disease on chronic oxygen therapy, diabetes, proximal atrial fibrillation and chronic knee and back pain secondary to osteoarthritis, who presented with intermittent chest pain for 3 days in total duration, although he is more concerned about his chronic low back pain as well as knee pain as well as concern about getting a ride home from the hospital.  1.  Atypical chest pain. Initial EKG without acute changes from prior and enzymes negative x 1. Continue trending cardiac enzymes. I feel that a stress is not indicated at this time. He had a stress test back in 2012, which was submaximal, but fairly normal. 2.  Chronic knee and back pain. Apparently  no opioids since 2013. He can have p.r.n. pain medications as well as physical therapy consult.  3.  Type 2 diabetes. Hold p.o. agents including metformin and start insulin sliding scale. 4.  Coronary artery disease, status post stenting. Continue his statin and add an aspirin. I suppose he is not on beta blocker therapy secondary to chronic obstructive pulmonary disease status, but he is on calcium channel blocker and will continue that.  5.  Chronic obstructive pulmonary disease. Continue Spiriva as well as supplemental O2. He is likely a good candidate for Advair, though question if he will be able to afford this secondary to insurance. 6.  Anxiety and depression. Continue home medications. We will be started on imipramine. 7.  Gastroesophageal reflux disease. Nexium.   CODE STATUS: The patient is full code.   TOTAL TIME SPENT: 35 minutes.   ____________________________ Cletis Athensavid K. Marlon Suleiman, MD dkh:aw D: 04/26/2013 04:08:58 ET T: 04/26/2013 05:34:47 ET JOB#: [id number]  cc: Cletis Athensavid K. Araf Clugston, MD, <Dictator> Daquarius Dubeau Synetta ShadowK Miranda Garber MD ELECTRONICALLY SIGNED 04/27/2013 1:19

## 2014-12-07 NOTE — Consult Note (Signed)
Brief Consult Note: Diagnosis: Polysubstance abuse.   Patient was seen by consultant.   Recommend further assessment or treatment.   Orders entered.   Comments: [name] has a h/o substance use and possibly medication misuse. He was admitted after an overdose. he denies that it was suicide attempt. He admits to drinking beer that night and possibly taking some medications in error. He falls asleep during the interview.   PLAN: 1. The patient is not suicidal or homicidal. I will terminate IVC.   2. I will follow along.  Electronic Signatures: Kristine LineaPucilowska, Krishan Mcbreen (MD)  (Signed 21-Apr-15 17:32)  Authored: Brief Consult Note   Last Updated: 21-Apr-15 17:32 by Kristine LineaPucilowska, Karlye Ihrig (MD)

## 2014-12-07 NOTE — Consult Note (Signed)
PATIENT NAME:  Kristopher Powell, Kristopher Powell MR#:  [id number] DATE OF BIRTH:  Oct 07, 1955  DATE OF CONSULTATION:  12/04/2013  REFERRING PHYSICIAN:  Ramonita LabAruna Gouru, MD CONSULTING PHYSICIAN:  Tommie Dejoseph B. Olivianna Higley, MD  REASON FOR CONSULTATION: To evaluate a suicidal patient.   IDENTIFYING DATA: Kristopher Powell is a [age] year old male with the history of alcoholism and substance use.   CHIEF COMPLAINT: "I am not suicidal."  HISTORY OF PRESENT ILLNESS:  I tried to assess Kristopher Powell in Critical Care Unit yesterday but he was too somnolent to participate in the interview. Today, he is slightly better but still falling asleep halfway through the sentence and unable to provide much detail. The patient was brought to the hospital after found by his family unresponsive in the middle of the night after presumed overdose on prescribed medication. The patient also had been drinking that day and consumed a 12-pack of beers. He was unable to provide much information, was placed on involuntary inpatient psychiatric commitment and admitted to the medical floor. The patient adamantly denies suicidal ideation, intention or plans.  He denies that he overdosed on medications on purpose. He tells me that most of the time he takes medicines as prescribed. However, I treated his son, Kristopher Powell Kristopher Powell, many times in the past and from Kristopher's history I understand that his father has been a substance user for many years. In fact, every time Kristopher, who lives with his mother, visits his father he gets into trouble by abusing drugs, drinking, fighting with his brother, who stays with the patient. The patient agrees that he has a history of substance abuse and drinking. He does not remember which medications he took. He does not believe that it was in a suicide attempt, rather when drunk he could have made an error. He is glad to be alive and denies any suicidal ideation.   PAST PSYCHIATRIC HISTORY: A history of substance abuse. I do not have any  information about his previous treatment or hospitalizations but I believe that there must be some.   FAMILY PSYCHIATRIC HISTORY: Both of his children had severe substance abuse problem.   PAST MEDICAL HISTORY: PCI, chronic obstructive pulmonary disease on 3.5 liters of oxygen, diabetes, paroxysmal atrial fibrillation, osteoarthritis, congestive heart failure with ejection fraction of 55% and tobacco use.   ALLERGIES: No known drug allergies.   MEDICATIONS ON ADMISSION: Ambien 10 mg 1/2 tablet at bedtime, Trilipix 135 mg daily, trazodone 150 at night, Spiriva 18 mcg daily, risperidone 2 mg daily, Proventil 2 puffs every 4 hours as needed, Nexium 40 mg daily, metformin 500 mg twice daily, imipramine 50 mg in the morning, ibuprofen 600 mg 3 times daily, diazepam 10 mg 3 times daily, Flexeril 10 mg 3 times daily, atorvastatin 80 mg daily, amlodipine 5 mg daily, Tylenol as needed.   MEDICATIONS AT THE TIME OF CONSULTATION: Lovenox injections, Haldol IV as needed, Levaquin daily, lorazepam as needed, Solu-Medrol 40 mg every 6 hours, nitroglycerin as needed, aspirin 81 mg daily, Ativan 2 mg IV push as needed, metoprolol 25 mg twice daily, lorazepam 1 mg IV push as a part of CIWA.    SOCIAL HISTORY: He is disabled. He lives with his son. He is divorced and he has a caretaker coming a couple of times a week to help him.   REVIEW OF SYSTEMS: Difficult to obtain. The patient denies any pain or discomfort and actually it is quite sedated, falling asleep midsentence.   PHYSICAL EXAMINATION: VITAL SIGNS: Blood pressure 161/105, pulse 103,  respirations 22, temperature 97.9.  GENERAL: This is an obese elderly gentleman breathing oxygen, in no acute distress.  The rest of the physical examination is deferred to his primary attending.   LABORATORY, DIAGNOSTIC AND RADIOLOGICAL DATA:  Chemistries: Blood glucose 149, sodium 135. LFTs within normal limits except for alkaline phosphatase of 139, ALT of 477 and ALT of  220. Total CK on 11,566. Urine tox screen is positive for benzodiazepines and tricyclic antidepressants only.  CBC within normal limits except for white blood count of 19.9. Urinalysis is not suggestive of urinary tract infection. Serum acetaminophen less than 2, serum salicylates 3.5. EKG normal sinus rhythm, nonspecific intraventricular conduction block, T wave abnormality. Echo Doppler ejection fraction 55% to 60%.   MENTAL STATUS EXAMINATION: The patient is alert, but falling sleep frequently. He is not difficult to arouse. He is pleasant, polite and cooperative. He maintains limited eye contact. His speech is slurred, difficult to understand. He is mood is fine with flat affect. Thought process is slow. He denies thoughts of hurting himself or others. There are no delusions or paranoia. There is no evidence of auditory or visual hallucinations. His cognition is impaired. The patient is unable to participate in cognitive part of the examination. He is a poor historian. He is probably of normal intelligence and normal fund of knowledge. His insight and judgment are limited.   DIAGNOSES: AXIS I: Polysubstance dependence.  AXIS II: Deferred.  AXIS III: Coronary artery disease, chronic obstructive pulmonary disease on oxygen, diabetes, atrial fibrillation, osteoarthritis, congestive heart failure.  AXIS IV: Mental and physical illness, substance abuse, primary support, loss of way of life.  AXIS V: Global assessment of functioning 55.   PLAN:  1.  The patient is on CIWA protocol for symptoms of alcohol withdrawal. He is too sedated to place him on Librium or Ativan taper. We will monitor symptoms of withdrawal.  2.  He has been on Risperdal, possibly for mood stabilization. I do not think that this is the most important medication at present.  3.  He overdosed on tricyclics prescribed for pain possibly. We will hold that for now.  4.  I will follow up. I will try to get in touch with the family.     ____________________________ Ellin Goodie Jennet Maduro, MD jbp:cs D: 12/04/2013 18:32:34 ET T: 12/04/2013 19:10:16 ET JOB#: [id number]  cc: Joab Carden B. Jennet Maduro, MD, <Dictator> Shari Prows MD ELECTRONICALLY SIGNED 12/12/2013 23:40

## 2014-12-07 NOTE — H&P (Signed)
PATIENT NAME:  Kristopher Powell, Kristopher Powell MR#:  [id number] DATE OF BIRTH:  Sep 21, 1955  DATE OF ADMISSION:  05/10/2014  REFERRING PHYSICIAN: Dr.  Darnelle Catalan.   PRIMARY CARE PHYSICIAN: Nonlocal.   CHIEF COMPLAINT: Shortness of breath.  HISTORY OF PRESENT ILLNESS: This is a 59 year old Caucasian gentleman with history of COPD with chronic respiratory failure at 3 to 3.5 liters nasal cannula at baseline as well as coronary artery disease, type 2 diabetes, presenting with shortness of breath and cough per history. The patient is unable to provide any meaningful information given current mental status. History obtained from the ER staff, as well as EMS. He described 1 week duration of progressive shortness of breath with associated cough productive of greenish sputum without fevers, chills, no evidence of chest pain. Upon arrival to the Emergency Department he has been unresponsive, unable to provide meaningful information given his mental status. Of note, he has similar presentation back in April 2015, which was secondary to suspected medication overdose of benzodiazepines as well as TCAs. Unable to confirm at this time if he has taken any extra doses of medications through ER work-up. Urine drug screen positive for benzodiazepines as well as TCAs. Once again, unable to provide any meaningful information at this time given mental status:  REVIEW OF SYSTEMS: Unable to obtain given the patient's mental status and medical condition.   PAST MEDICAL HISTORY: Chronic obstructive pulmonary disease with chronic respiratory failure at 3.5 liters nasal cannula at baseline, type 2 diabetes, uncomplicated, hypertension, diastolic congestive heart failure, last known ejection fraction of 55%,  coronary artery disease. Remote history of PCA.   SOCIAL HISTORY: Positive for tobacco abuse, as well as history of alcohol usage. Unknown  timing of last drink.   FAMILY HISTORY: Unable to obtain given the patient's mental status.    ALLERGIES: No known drug allergies.   HOME MEDICATIONS: Per our documentation, include acetaminophen/oxycodone 325/10 mg p.o. q. 6 hours as needed for pain, aspirin 81 mg p.o. daily, oxycodone 20 mg extended release p.o. b.i.d., Ultram 50 mg p.o. q. 4 hours as needed for pain, lisinopril 10 mg p.o. daily, diazepam 5 mg p.o. q. 8 hours as needed for anxiety, imipramine 50 mg p.o. in the morning 100 mg in the evening, trazodone 100 mg 2 tabs p.o. at bedtime, atorvastatin 80 mg p.o. at bedtime, triplex 135 mg p.o. daily, risperidone 10 mg p.o. at bedtime, Proventil 90 mcg inhalation 2 puffs 4 times daily as needed, Spiriva 18 mcg inhalation, potassium 10 mEq 2 times daily, Flexeril 10 mg p.o. 3 times daily, Nexium 40 mg p.o. daily.   PHYSICAL EXAMINATION:  VITAL SIGNS: Temperature 98.1, heart rate 92, respirations 22, blood pressure 100/68, saturating 93% on 4 liters nasal cannula. Weight 95.9 kg, BMI 28.7.  GENERAL: Disheveled Caucasian gentleman, currently in moderate distress given mental status.  HEAD: Normocephalic, atraumatic.  EYES: Pupils equal, round, measuring about 2 to 3 mm each, sluggishly reactive. No scleral icterus.  MOUTH: Dry mucosal membrane. Dentition intact. No abscess noted.  EARS, NOSE AND THROAT: Clear without exudates. No external lesions.  NECK: Supple. No thyromegaly. No nodules. No JVD.  PULMONARY: Coarse breath sounds scattered throughout all lung fields with scattered expiratory wheezing. No use of accessory muscles. Good respiratory effort.  CHEST: Nontender to palpation.  CARDIOVASCULAR: S1, S2, regular rate and rhythm. No murmurs, rubs, or gallops. No edema. Pedal pulses 2+ bilaterally.  GASTROINTESTINAL: Soft, nontender, nondistended. No masses. Positive bowel sounds. No hepatosplenomegaly.  MUSCULOSKELETAL: No swelling, clubbing, or edema.  Passive range of motion full in all extremities.  NEUROLOGICAL: Unable to fully assess given the patient's mental status and  medical condition. However, pupils are sluggishly reactive at this time. He will awaken after sternal rub, but then falls promptly back to sleep.  SKIN: No ulceration, lesions, rash, cyanosis. Skin dry. Turgor intact.  PSYCHIATRIC: Unable to fully assess given patient's mental status. Once again requiring sternal rub, however, remains lethargic.   LABORATORY DATA: Chest x-ray performed, no acute cardiopulmonary process. CT head performed, no acute findings. There is evidence of progressive gliosis when compared to a recent CT head and MRI. CT abdomen and pelvis performed evidence of bilateral nonobstructing nephrolithiasis, however, no acute findings. Remainder of laboratory data: Sodium 138, potassium 3.3, chloride 104, bicarbonate 27, BUN 7, creatinine 0.6, glucose 96. LFTs: Albumin 3.2, alkaline phosphatase 139, AST 13, ALT 11. Urine drug screen positive for benzodiazepines as well as TCAs. WBC 14.7, hemoglobin 13, platelets of 352,000. Urinalysis negative for evidence of infection. ABG performed, pH 7.39, CO2 of 43, 02 58, bicarbonate of 26. This was actually on room air.   ASSESSMENT AND PLAN: A 59 year old Caucasian gentleman with history of chronic obstructive pulmonary disease with chronic respiratory failure, shortness of breath and cough; however, on arrival with altered mental status.   1. Acute encephalopathy likely related to medications including TCAs and benzodiazepines. No acute findings on the CT head. Will provide supportive care. CIWA protocol as given history of alcohol as well as benzodiazepine abuse. EKGs is within normal limits including QRS as well as QTc.  2. Chronic obstructive pulmonary disease exacerbation. Provide azithromycin, supplemental oxygen to keep oxygen saturation greater than 92%. DuoNeb treatments q. 4 hours as well as Solu-Medrol 60 mg IV daily. Provide incentive spirometry when patient is awake and able.  3. Hypokalemia. Replace potassium, goal of 4 to 5.  4.  Diabetes type 2, hold po agents add insulin sliding sacel with q. 6 hour Accu-Cheks.  5. Venous thromboembolism prophylaxis prophylactic heparin subcutaneous.  CODE STATUS: The patient is a full code.   TIME SPENT: 55 minutes.    ____________________________ Cletis Athensavid K. Hower, MD dkh:JT D: 05/10/2014 01:02:33 ET T: 05/10/2014 01:56:14 ET JOB#: [id number]  cc: Cletis Athensavid K. Hower, MD, <Dictator> DAVID Synetta ShadowK HOWER MD ELECTRONICALLY SIGNED 05/10/2014 20:20

## 2014-12-07 NOTE — Consult Note (Signed)
Powell NAME:  Kristopher Powell, Kristopher MR#:  [id number] DATE OF BIRTH:  Sep 16, 1955  DATE OF CONSULTATION:  05/11/2014  REFERRING PHYSICIAN:   CONSULTING PHYSICIAN:  Alondra Vandeven K. Guss Bundehalla, MD  LOCATION: [facility] room number 137, [place]  AGE: 59 years  SEX: Powell  RACE: White  SUBJECTIVE: Kristopher Powell was seen in [facility].  Kristopher Powell is Kristopher Powell who is not employed and last worked in 2001 when he took an early retirement because of congestive heart failure. Kristopher Powell is married for 31 years and has been separated since 2001 as they could not get along with each other. Kristopher Powell lives by himself in Kristopher house. Kristopher Powell [facility] after Kristopher suspected overdose of too much medication, according to Kristopher information obtained, and was found lying on Kristopher floor. He was brought here for help. According to information obtained from Kristopher chart/Powell, he has been taking medications as prescribed and he organizes his pill box and takes them only as prescribed. Kristopher Powell reports, according to Kristopher Powell, Kristopher chief complaint of "I could not breathe".   HISTORY OF PRESENT ILLNESS: Kristopher Powell was found on Kristopher floor by Kristopher Powell friend who visits him sometimes and helps him out. She called for help, and he was brought here.  PAST PSYCHIATRIC HISTORY: History of inpatient psychiatry once before for 7 days [facility] and this happened in Kristopher 1980s for alcohol detox. He has been sober since Kristopher 1980s when he went through detox. No history of suicide attempts. [facility], last appointment 3 months ago and next appointment is on 05/21/2014.   ALCOHOL AND DRUGS: He admits that he has been sober for many years and has not been drinking alcohol. No history of DWI and never Kristopher history of public drunkenness. Does admit to smoking nicotine cigarettes at Kristopher rate of Kristopher pack Kristopher day and, in fact, he has cut it down from 3 packs Kristopher day. He denies any other  street or prescription drug abuse.   MENTAL STATUS: Kristopher Powell is seen lying in bed comfortably. Alert and oriented. Calm, pleasant, and cooperative. Affect is appropriate with his mood, which is stable. Denies feeling depressed. Denies feeling hopeless or helpless. Denies feeling worthless or useless. Cognition is intact. He knew [place] and [place]opher name of Kristopher current president. No psychosis. Does not appear to be responding to internal stimuli. Memory is intact. Memory and recall is good, and he could remember all Kristopher 3 objects that he was asked to remember after several minutes. He could count money. General knowledge/information is fair. Denies any suicidal or homicidal plans. Contracts for safety. He is eager to go back home and take care of himself.  IMPRESSION: Depressive disorder; rule out mood disorder secondary to physical problems such as congestive heart failure and breathing  problems. Recommend Kristopher Powell should be continued to be followed by his psychiatrist, [facility]. He has an appointment coming up on 05/21/2014 and plans to keep it. Continue Kristopher medications recommended by Dr. Elesa MassedWard. Dr. Elesa MassedWard is to be informed and to see if Kristopher Powell needs any help with home health care or needs to be in Kristopher structured facility as Kristopher Powell is able to take care of himself with some help.   ____________________________ Jannet MantisSurya K. Guss B[place]halla, MD skc:MT D: 05/11/2014 10:57:26 ET T: 05/11/2014 12:10:20 ET JOB#: [id number]  cc: Jamesrobert Ohanesian K. Guss Bunde, MD, <Dictator> Beau Fanny MD ELECTRONICALLY SIGNED 05/12/2014 10:28

## 2014-12-07 NOTE — Consult Note (Signed)
Brief Consult Note: Diagnosis: Possible overdose.   Patient was seen by consultant.   Comments: I tried to see [name] in CCU. He was too tired to talk to me. Will try tomorrow.  Electronic Signatures: Kristine LineaPucilowska, Barett Whidbee (MD)  (Signed 20-Apr-15 15:05)  Authored: Brief Consult Note   Last Updated: 20-Apr-15 15:05 by Kristine LineaPucilowska, Carson Bogden (MD)

## 2014-12-07 NOTE — Consult Note (Signed)
PATIENT NAME:  Kristopher Powell Powell, Kristopher Powell MR#:  [id number] DATE OF BIRTH:  17-Jul-1956  DATE OF CONSULTATION:  05/10/2014  CONSULTING PHYSICIAN:  Audery AmelJohn T. Clapacs, MD  PSYCHIATRY CONSULT: This is a 59 year old man who came into the hospital yesterday with altered mental status thought to probably be due to a combination of substances. The patient was not interviewed today because he was not available. He was engaged in physical therapy during the only time I had available to see him today. Since he was not slated yet for discharge, I will try and see him tomorrow. Consultation was because of concern about a possible intentional overdose. The son wanted to be spoken to and I talked to him on the phone for about 20 minutes today. The son believes that the patient was trying to kill himself but admits that he has no proof at all of this. There is no note, there is no claim the patient had stated any suicidal intention. The staff report that he has consistently denied any suicidal ideation or intent at all.   PAST PSYCHIATRIC HISTORY: A similar event happened in April of this year when he came into the hospital with what appeared to probably be an accidental overdose combination. At that time he was not judged to meet commitment criteria and was not admitted to psychiatry, but was referred for treatment when he was discharged to a skilled nursing facility or rehabilitation facility.   SOCIAL HISTORY: Appears to be back living independently.   ASSESSMENT: As I said I did not get a chance to interview him today. From what I see in the chart he is not behaving in a dangerous manner. He has been cooperative with treatment. I did speak with the son as requested today. The son has an opinion that the father is suicidal, but has no proof that he could offer to me to back this up or evidence even. I will try and assess the patient over the weekend further. No indication to change anything about treatment at this point.     ____________________________ Audery AmelJohn T. Clapacs, MD jtc:lt D: 05/10/2014 22:56:11 ET T: 05/10/2014 23:26:48 ET JOB#: [id number]  cc: Audery AmelJohn T. Clapacs, MD, <Dictator> Audery AmelJOHN T CLAPACS MD ELECTRONICALLY SIGNED 05/23/2014 10:33

## 2014-12-07 NOTE — Consult Note (Signed)
Brief Consult Note: Comments: Psychiatry: Attempted consult. Patient engaged in physical therapy. May be able to make time later but consult may need to be done at a late Date.  Electronic Signatures: Zoie Sarin, Jackquline DenmarkJohn T (MD)  (Signed 25-Sep-15 15:40)  Authored: Brief Consult Note   Last Updated: 25-Sep-15 15:40 by Audery Amellapacs, Skylene Deremer T (MD)

## 2014-12-07 NOTE — Consult Note (Signed)
Brief Consult Note: Diagnosis: Major depressive disorder.   Patient was seen by consultant.   Consult note dictated.   Recommend further assessment or treatment.   Orders entered.   Comments: [name] has a h/o depression and . He has been in the [facility] of [facility]. he has been maintained on Valium, Trazodone, Risperdal, and tricyclics. He denies medication misuse. He denies heavy alcohol, use as of late.   I received records from Dr. Jolyn Powell's office. The patient is asking to restart medications. The patient will be discharged to SNF this pm. He is in agreement.  PLAN: 1. Will restart Imipramine 50 mg in am, 100 mg at pm, Risperdal 2 mg at bedtime, Trazodome 200 mg at bedtime. Valium 5 mg tid as needed for anxiety.   2. He will follow up with Dr. Elesa Powell, his primary psychiatrist.  3. I will sign off.  Electronic Signatures: Kristopher Powell, Kristopher Powell (MD)  (Signed 24-Apr-15 14:38)  Authored: Brief Consult Note   Last Updated: 24-Apr-15 14:38 by Kristopher Powell, Kristopher Powell (MD)

## 2014-12-07 NOTE — Consult Note (Signed)
Brief Consult Note: Diagnosis: major depressive disorder, Polysubstance abuse.   Patient was seen by consultant.   Consult note dictated.   Recommend further assessment or treatment.   Orders entered.   Comments: Kristopher Powell has a h/o depression and . He has been in the care of [facility] forever. he has been maintained on Valium, Trazodone, Risperdal, and tricyclics. He denies medication misuse. He denies heavy alcoho, use as of late. he admits to 5-6 bers on the night of admission but not 12-13. BAL on admission 6. He denies suicidal ideation, intention or plans. he wants to return to home where he feels safe. I spoke with the patient and his first ex-wife at lenght. I also spoke with his son who wants the patient placed.   PLAN: 1. The patient does not meet criteria for IVC.    2. He does have the capacity to make decisions about disposition.   3. He will benefit from restarting his psychotropics.  4. I will follow along.  Electronic Signatures: Kristine LineaPucilowska, Wednesday Ericsson (MD)  (Signed 22-Apr-15 19:49)  Authored: Brief Consult Note   Last Updated: 22-Apr-15 19:49 by Kristine LineaPucilowska, Lashai Grosch (MD)

## 2014-12-07 NOTE — Discharge Summary (Signed)
PATIENT NAME:  Kristopher Powell, Kristopher Powell MR#:  [id number] DATE OF BIRTH:  Feb 04, 1956  DATE OF ADMISSION:  05/10/2014 DATE OF DISCHARGE:  05/11/2014  For a detailed note, please see the history and physical done on admission by Dr. Angelica Ranavid Hower.   DIAGNOSES AT DISCHARGE: As follows:  1.  Chronic obstructive pulmonary disease exacerbation.  2.  Altered mental status, likely metabolic encephalopathy due to polypharmacy.  3.  Depression.  4.  Diabetes.  5. Gastroesophageal reflux disease.    The patient is being discharged on a low-sodium, low-fat, American Diabetic Association diet.   ACTIVITY: As tolerated.   Followup is with his primary care physician in the next 1-2 weeks.   DISCHARGE MEDICATIONS:   1.  Nexium 40 mg daily.  2.  Albuterol inhaler 2 puffs 4 times daily as needed. 3.  Spiriva 1 puff daily.  4.  Trilipix 125 mg daily.  5.  Atorvastatin 80 mg at bedtime.  6.  Paxil 10 mg t.i.d. as needed.  7.  Oxycodone 20 mg q. 12 hours.  8.  Aspirin 81 mg daily.  9.  Trazodone 100 mg 2 tablets at bedtime.  10. Risperdal 2 mg at bedtime. 11. Valium 5 mg q. 8 hours as needed.  12. Potassium 10 mEq b.i.d.  13. Imipramine 50 mg 4 tabs at bedtime.  14. Amlodipine 10 mg daily.  15. Lasix 10 mg daily.  16. Metformin 500 mg b.i.d.  17. Gabapentin 300 mg 2 tablets every 4 hours. 18. Imipramine 50 mg daily in the morning. . 19. Prednisone taper starting at 50 mg down to 10 mg over the next 5 days.  20. Zithromax 500 mg daily x 3 days.   CONSULTANTS DURING THE HOSPITAL COURSE:  Dr. Toni Amendlapacs from psychiatry.   PERTINENT STUDIES: Are as follows:  1.  A CT scan of the head done without contrast showing no acute intracranial process.  2.  A CT abdomen and pelvis done with contrast showing nonobstructive bilateral nephrolithiasis. No any acute intra-abdominal or pelvic pathology.   HOSPITAL COURSE: This is a 59 year old male with medical problems as mentioned above, who presented to the hospital on  05/10/2014 due to shortness of breath and noted to be in COPD exacerbation.  1.  COPD exacerbation: This is likely the cause of the patient's shortness of breath. The patient was treated with IV steroids, around-the-clock nebulizer treatments, placed on his maintenance Spiriva and also Zithromax was added for possible underlying bronchitis. The patient's chest x-ray did not show any evidence of pneumonia. After being on aggressive therapy for 24-48 hours the patient's shortness of breath has improved. He is therefore being discharged on oral prednisone taper and Zithromax as stated.  2.  Altered mental status/encephalopathy. The exact etiology of this is unclear, but suspected to be related to polypharmacy. The patient was apparently on a lot of sedative medications, they were held. His mental status did return to baseline. His son was increasingly concerned about the patient overdosing on some of the medications, and as he has a history of depression a psychiatric consult was obtained. The patient was seen by Dr. Toni Amendlapacs who did not think that the patient had suicidal ideations or needed any inpatient psychiatric care. The patient will continue to follow up with his psychiatrist as an outpatient, Dr. Delena ServeWharton. Continue his maintenance medications as stated. The patient's psychiatric medications were reintroduced and his mental status remained stable.  3.  Hypertension. The patient remained hemodynamically stable. He will continue his lisinopril.  4.  Hyperlipidemia. The patient was maintained on his atorvastatin and he will resume that.  5.  Chronic pain. The patient was maintained on his OxyContin. He will resume that.   CODE STATUS: The patient is a full code.   DISPOSITION: He is being discharged home with home health, physical therapy and nursing services.   Time spent:  40 minutes    ____________________________ Rolly Pancake. Cherlynn Kaiser, MD vjs:lt D: 05/12/2014 12:22:32 ET T: 05/12/2014 16:35:23  ET JOB#: [id number]  cc: Rolly Pancake. Cherlynn Kaiser, MD, <Dictator> Houston Siren MD ELECTRONICALLY SIGNED 05/13/2014 15:13

## 2014-12-07 NOTE — Consult Note (Signed)
PATIENT NAME:  Kristopher Powell, Kristopher Powell MR#:  [id number] DATE OF BIRTH:  1956/05/03  DATE OF CONSULTATION:  12/05/2013  CONSULTING PHYSICIAN:  Pauletta Browns, MD  Information obtained from the patient's chart, the patient and the patient's son who is at bedside.  This is a 59 year old Caucasian male with past medical history of coronary artery disease, history of PCI stenting, COPD on supplemental oxygen at baseline, history of paroxysmal atrial fibrillation, diabetes, osteoarthritis, depression, presenting to [facility] with altered mental status. As per son, the patient had 12 to 13 beers and tried to overdose on medications; suspected that he took increased tricyclic antidepressants. On admission, the patient's CK was elevated. CAT scan of the head did not show any acute intracranial abnormalities. As per patient's son, there is a difficult situation at home. He currently lives with a woman that, as per son, has had multiple conflicting interactions with the patient, and the patient's son does not feel that the patient can go back to the living facility safely. Urine drug screen was positive for benzos and tricyclic antidepressants. On neurological consultation for weakness in right upper extremity and some numbness in the right upper extremity.   PAST MEDICAL HISTORY: Coronary artery disease status post PCI, COPD on chronic O2, diabetes, atrial fibrillation, not on anticoagulation; history of EtOH abuse.   SOCIAL HISTORY: Lives, as per son, with a woman that is supposedly his caretaker, but has not been taking care of him, as per son. The patient used to work as a Curator.   HOME MEDICATIONS: Include Ambien, Trilipix, trazodone, Spiriva, Proventil, Nexium, imipramine, ibuprofen, atorvastatin.   CURRENT REVIEW OF SYSTEMS: No shortness of breath. No visual changes. No chest pain. No palpitations. No diarrhea. No constipation. No heat or cold intolerance. Weaker right upper extremity  compared to the left upper extremity.   IMAGING AND LABORATORY DATA: Workup has been reviewed. MRI of the brain was performed and the patient has focal T2 signal hyperdensities bilaterally in globus pallidus.   IMPRESSION: A 59 year old and male with history of chronic obstructive pulmonary disease, atrial fibrillation, on home oxygen, diabetes, osteoarthritis, depression, brought to the hospital with a period of unresponsiveness, questionable attempt of overdose. The patient had a significant amount of EtOH intake and ingested what is suspected to be extra doses of tricyclic antidepressants. The patient's current mental status is significantly improved. On physical examination, the patient is alert, awake, oriented to time, place and location; unaware of why he is in the hospital. Speech appears to be dysarthric, but probably that is his chronic speech. Extraocular movements are intact. Questionable right facial droop, but difficult to assess. Tongue appears to be midline. Shoulder shrug intact. There is weakness in the right upper extremity, 3 out of 5 which is new.  No weakness in the right lower extremity. The left side is 5 out of 5. Decreased slight sensation in right upper extremity. Coordination difficult to assess on the right. Gait not assessed.   PLAN: After reviewing the MRI, the patient does have hyperintensities bilaterally in the globus pallidus, most commonly seen in carbon monoxide poisoning or other toxicities such as methanol, cyanide.  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 The patient has also had a history of leaky heaters, which could be contributing to this. I do not see any acute ischemia on the MRI. I will consult physical therapy, occupational therapy. The patient's son also stated that he does  not feel safe for the patient to be discharged home, as there were issues with the caregiver that the patient lives with. The  patient has not showered for about 2-1/2 months and needs daily assistance, but might benefit from nursing home facility. Would appreciate social worker/case management consultation as well. Place the patient on antiplatelet medication. I suspect his weakness will improve. Obtain an EEG; reason being is during the episode of intoxication or confusion, he might have had a partial seizure, which could have caused Todd's paralysis on the right.   Thank you.  It was a pleasure seeing this patient.   ____________________________ Pauletta BrownsYuriy Mishti Swanton, MD yz:dmm D: 12/05/2013 11:43:44 ET T: 12/05/2013 11:59:43 ET JOB#: [id number]  cc: Pauletta BrownsYuriy Bellarae Lizer, MD, <Dictator> Pauletta BrownsYURIY Eren Puebla MD ELECTRONICALLY SIGNED 12/12/2013 11:33

## 2014-12-07 NOTE — H&P (Signed)
PATIENT NAME:  Kristopher Powell, Kristopher Powell MR#:  [id number] DATE OF BIRTH:  Jan 02, 1956  DATE OF ADMISSION:  12/03/2013  PRIMARY CARE PHYSICIAN: Nonlocal  REFERRING PHYSICIAN: Dr. Dolores Frame  CHIEF COMPLAINT: Unresponsiveness.   HISTORY OF PRESENT ILLNESS: The patient is a 59 year old Caucasian male with a past medical history of coronary artery disease status post PCI with stenting many years ago, COPD, on supplemental oxygen 3.5 liters via nasal cannula as baseline, continues nicotine abuse, paroxysmal atrial fibrillation, type 2 diabetes mellitus, osteoarthritis, and remote history of depression who is brought into the ER for unresponsiveness. The patient lives with caregiver. Around 1 a.m. last night, when family checked on the patient, caregiver told them that the patient was doing fine and resting comfortably. At around 3:00 a.m., the patient was found to be unresponsive by the caregiver and EMS was called, and the patient was brought into the ER. Family told EMS the patient had 12 to 13 beers last night and possibly took unknown amount of medications. They are assuming the patient took his tricyclic antidepressant, imipramine, unknown amount. In the ER, the patient was initially unresponsive and withdrawing to pain stimuli. The patient was hypoxic and the patient was placed on Ventimask. Initial CT head was done which was negative for any acute findings. NG tube was placed and the patient was given activated charcoal. Hospitalist team is called to admit the patient. No family members at bedside during my examination. The patient is arousable, but very groggy and lethargic. He is mumbling and spontaneously moving his extremities. The patient was made IVC by the ER physician, and the patient is on one-on-one observation. I was unable to get any history from the patient. The patient's initial serum alcohol level is at 6. Total CK is 1548. The patient has received fluid boluses. Urine drug screen is positive for benzos and  tricyclic antidepressants. White count is elevated at 19.5.   PAST MEDICAL HISTORY: Coronary artery disease status post PCI, COPD, lives on 3.5 liters of oxygen via nasal cannula, ongoing tobacco abuse, type 2 diabetes mellitus, paroxysmal atrial fibrillation, the patient is not on any anticoagulation, osteoarthritis, congestive heart failure with last documented ejection fraction 55%, history of alcohol abuse and tobacco abuse.  PAST SURGICAL HISTORY: PCI, knee surgery for osteoarthritis.  PSYCHOSOCIAL HISTORY: Lives with caregiver. History of tobacco and alcohol abuse. The patient, according to the old records, uses combination of walker, wheelchair, and electric scooter for ambulation.   ALLERGIES: No known drug allergies.   HOME MEDICATIONS: Ambien 10 mg 1/2 tablet to 1 tablet p.o. once daily, Trilipix 135 mg 1 capsule p.o. once daily, trazodone 150 mg 1 to 2 tablets orally at bedtime, Spiriva 18 mcg inhalation once a day, risperidone 2 mg 1 tablet p.o. once daily, Proventil 2 puffs inhalation q. 4 hours as needed, Nexium 40 mg once daily, metformin 500 mg b.i.d., imipramine 50 mg 1 tablet p.o. q. a.m., ibuprofen 600 mg 1 tablet p.o. 3 times a day, diazepam 10 mg 3 times a day, cyclobenzaprine 10 mg 3 times a day, atorvastatin 80 mg once daily, amlodipine 5 mg once daily, Tylenol as needed.   REVIEW OF SYSTEMS: Unobtainable.   PHYSICAL EXAMINATION: VITAL SIGNS: Pulse 93, respirations 20, blood pressure 134/106, pulse ox 96% on Ventimask. GENERAL APPEARANCE: Not in acute distress, but very lethargic, arousable and falling asleep mumbling. HEENT: Normocephalic, atraumatic. Pupils are equally reacting to light and accommodation, but very sluggish, 3 mm in size. No scleral icterus. No conjunctival injection. No sinus  tenderness. Dry mucous membranes.  NECK: Supple. No JVD. Moving spontaneously.  LUNGS: Coarse breath sounds. No crackles.  HEART: S1 and S2 regular rate and rhythm. No murmurs.   ABDOMEN: Soft. Bowel sounds are positive in all 4 quadrants. Nontender, nondistended, obese. No masses felt. NEUROLOGIC: Arousable but very lethargic and groggy. Spontaneously moving extremities. Not following verbal commands. Reflexes are 2+.  EXTREMITIES: No edema. No cyanosis. No clubbing.  SKIN: Warm to touch. Normal turgor. No rashes. No lesions.  PSYCH: Mood and affect could not be elicited.   DIAGNOSTIC DATA: LFTs: Alkaline phosphatase is elevated at 161, AST 181, ALT 111, CK total 1548. Troponin less than 0.02. TSH 2.31. Urine drug screen is positive for TCAs and benzos. WBC is 19.5, hemoglobin and hematocrit are normal, and platelet count 327,000.   Urinalysis: Straw-colored, clear in appearance, glucose negative, bili negative, ketones negative, nitrites and leukocyte esterase are negative.   Tylenol less than 2. Serum salicylate level is 3.5.   ABG: PH 7.33, pCO2 35, pO2 56, FiO2 28%, bicarb 18.5.   Chem-8: BUN 7, creatinine 1.67, sodium 132, potassium 4, chloride 101, CO2 21, GFR 44. Anion gap is normal. Serum osmolality 264. Calcium 7.9. Serum alcohol level is 6. Glucose is 125.  Twelve-lead EKG: Normal sinus rhythm at 94 beats per minute. PR interval 202 with first-degree AV block. Nonspecific intraventricular block. T-wave abnormality in the lateral leads. No acute ST changes.   CT head without contrast: No acute intracranial abnormalities. Chronic atrophy and small vessel ischemic changes.   Chest x-ray, portable: Cardiac enlargement with mild pulmonary vascular congestion. No infiltrates or edema. Atelectasis in the lung base.   ASSESSMENT AND PLAN: A 59 year old male with unresponsiveness, noted at around 3:00 a.m. being unresponsive with 12 to 13 beers intake followed by unknown drug, as reported by family members to EMR, status post NG tube and charcoal in the ER. Will be admitted with the following assessment and plan:  1.  Altered mental status from unknown drug  overdose, probably imipramine and some other medications. Will admit the patient to CCU. We will keep him n.p.o. and provide aggressive hydration with IV fluids. Status post NG tube and charcoal. The patient will be on telemetry monitoring. Check a.m. labs. The patient is placed on IVC and psych consult is placed.  2.  Rhabdomyolysis, drug-induced. We will provide additional hydration with IV fluids. The patient was given fluid boluses in the ER.  3.  Diabetes mellitus. The patient will be on sliding scale insulin.  4.  Paroxysmal atrial fibrillation. Currently rate controlled. Not on anticoagulation.  5.  History of coronary artery disease. The patient is currently n.p.o. 6.  Chronic obstructive pulmonary disease. No exacerbation. Will provide nebulizer treatments on as needed basis.   No family members available at bedside. I was unable to reach them. Will consider him as FULL code for now. We will provide gastrointestinal prophylaxis and deep vein thrombosis prophylaxis   CRITICAL CARE TIME SPENT: 50 minutes.  ____________________________ Ramonita LabAruna Hennessey Cantrell, MD ag:sb D: 12/03/2013 08:06:41 ET T: 12/03/2013 08:29:57 ET JOB#: [id number]  cc: Ramonita LabAruna Persis Graffius, MD, <Dictator> Ramonita LabARUNA Javae Braaten MD ELECTRONICALLY SIGNED 12/19/2013 4:13

## 2014-12-07 NOTE — Consult Note (Signed)
Brief Consult Note: Diagnosis: major depressive disorder, Polysubstance abuse.   Patient was seen by consultant.   Consult note dictated.   Recommend further assessment or treatment.   Orders entered.   Comments: [name] has a h/o depression and . He has been in the care of [facility]. he has been maintained on Valium, Trazodone, Risperdal, and tricyclics. He denies medication misuse. He denies heavy alcoho, use as of late. His son wants him placed. The patient apparently in agreement. I received records from Dr. Jolyn NapWard's office. The patient is asking to restart medications. I am not sure if there are contraindications. I put orders in but suspanded them for now. Imipramine 50 mg in am, 200 mg at pm, Risperdal 2 mg at bedtime, Trazodome 100 mg at bedtime. Valium 10 mg tid, Ambien 10 mg as needed.   PLAN: 1. The patient does not meet criteria for IVC.    2. Please restart psychotropics if no contraindications.  3. Next appointment with Dr. Elesa MassedWard 03/05/14 at 2:00pm.  4. I will follow along..  Electronic Signatures: Kristine LineaPucilowska, Ahlana Slaydon (MD)  (Signed 23-Apr-15 15:54)  Authored: Brief Consult Note   Last Updated: 23-Apr-15 15:54 by Kristine LineaPucilowska, Ryanna Teschner (MD)

## 2014-12-07 NOTE — Discharge Summary (Signed)
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    Chief Complaint/History of Present Illness a 59 year old Caucasian male with a past medical history of coronary artery disease status post PCI with stenting many years ago, COPD, on supplemental oxygen 3.5 liters via nasal cannula as baseline, continues nicotine abuse, paroxysmal atrial fibrillation, type 2 diabetes mellitus, osteoarthritis, and remote history of depression who is brought into the ER for unresponsiveness. The patient lives with caregiver. Around 1 a.m. last night, when family checked on the patient, caregiver told them that the patient was doing fine and resting comfortably. At around 3:00 a.m., the patient was found to be unresponsive by the caregiver and EMS was called, and the patient was brought into the ER. Family told EMS the patient had 12 to 13 beers last night and possibly took unknown amount of medications. They are assuming the patient took his tricyclic antidepressant, imipramine, unknown amount. In the ER, the patient was initially unresponsive and withdrawing to pain stimuli. The patient was hypoxic and the patient was placed on Ventimask. Initial CT head was done which was negative for any acute findings. NG tube was placed and the patient was given activated charcoal. Hospitalist team is called to admit the patient. No family members at bedside during my examination. The patient is arousable, but very groggy and lethargic. He is mumbling and spontaneously moving his extremities. The patient was made IVC by the ER physician, and the patient is on one-on-one observation. I was unable to get any history from the patient. The patient's initial serum alcohol level is at 6. Total  CK is 1548. The patient has received fluid boluses. Urine drug screen is positive for benzos and tricyclic antidepressants. White count is elevated at 19.5.   Allergies:  No Known Allergies:   Thyroid:  20-Apr-15 04:27   Thyroid Stimulating Hormone 2.31 (0.45-4.50 (International Unit)  ----------------------- Pregnant patients have  different reference  ranges for TSH:  - - - - - - - - - -  Pregnant, first trimetser:  0.36 - 2.50 uIU/mL)  Hepatic:  20-Apr-15 04:27   Bilirubin, Total 0.3  Alkaline Phosphatase  161 (45-117 NOTE: New Reference Range 07/06/13)  SGPT (ALT)  111  SGOT (AST)  181  Total Protein, Serum 8.0  Albumin, Serum 3.9  General Ref:  20-Apr-15 04:27   Acetaminophen, Serum < 2 (10-30 POTENTIALLY TOXIC:  > 200 mcg/mL  > 50 mcg/mL at 12 hr after  ingestion  > 300 mcg/mL at 4 hr after  ingestion)  Salicylates, Serum  3.5 (0.0-2.8 Therapeutic 2.8-20.0 mg/dL Toxic >30.0 mg/dL)  Cardiology:  20-Apr-15 16:54   Echo Doppler REASON FOR EXAM:     COMMENTS:     PROCEDURE: Dendron - ECHO DOPPLER COMPLETE(TRANSTHOR)  - Dec 03 2013  4:54PM   RESULT: Echocardiogram Report  Patient Name:   Kristopher Powell Date of Exam: 12/03/2013 Medical Rec #:  [id number]        Custom1: Date ofBirth:  Jul 26, 1956     Height:       70.0 in Patient Age:    68 years      Weight:       325.0 lb Patient Gender: M  BSA:          2.57 m??  Indications: CHF Sonographer:    Arville Go RDCS Referring Phys: Nicholes Mango  Summary:  1. Left ventricular ejection fraction, by visual estimation, is 55 to  60%.  2. Impaired relaxation pattern of LV diastolic filling.  3. Normal global left ventricular systolic function.  4. Normal right ventricular size and systolic function.  5. Mild tricuspid regurgitation.  6. Mildly elevated pulmonary artery systolic pressure.  7. Mild dilatation of the ascending aorta. 2D AND M-MODE MEASUREMENTS (normal ranges within parentheses): Left  Ventricle:          Normal IVSd (2D):      1.28 cm (0.7-1.1) LVPWd (2D):     1.35 cm (0.7-1.1) Aorta/LA:                  Normal LVIDd (2D):     4.46 cm (3.4-5.7) Aortic Root (2D): 3.30 cm (2.4-3.7) LVIDs (2D):     3.14 cm           Left Atrium (2D): 3.00 cm (1.9-4.0) LV FS (2D):     29.6 %   (>25%) LV EF (2D):     56.8 %   (>50%)                                   Right Ventricle:                                   RVd (2D): LV DIASTOLIC FUNCTION: MV Peak E: 0.90 m/s Decel Time: 153 msec MV Peak A: 1.16 m/s E/A Ratio: 0.77 SPECTRAL DOPPLER ANALYSIS (where applicable): Mitral Valve: MV P1/2 Time: 44.37 msec MV Area, PHT: 4.96 cm?? Aortic Valve: AoV Max Vel: 1.75 m/s AoV Peak PG: 12.2 mmHg AoV Mean PG: LVOT Vmax: 1.10 m/s LVOT VTI:  LVOT Diameter: 2.50 cm AoV Area, Vmax: 3.09 cm?? AoV Area, VTI:  AoV Area, Vmn: Tricuspid Valve and PA/RV Systolic Pressure: TR Max Velocity: 2.96 m/s RA  Pressure: 10 mmHg RVSP/PASP: 44.9 mmHg Pulmonic Valve: PV Max Velocity: 1.11 m/s PV Max PG: 4.9 mmHg PV Mean PG:  PHYSICIAN INTERPRETATION: Left Ventricle: The left ventricular internal cavity size was normal. LV  posterior wall thickness was normal. Global LV systolic function was  normal. Left ventricular ejection fraction, by visual estimation, is 55  to 60%. Spectral Doppler shows impaired relaxation pattern of LV  diastolic filling. Right Ventricle: Normal right ventricular size, wall thickness, and  systolic function. The right ventricular size is normal. Global RV   systolic function is normal. Left Atrium: The left atrium is normal in size. Right Atrium: The right atrium is normal in size. Pericardium: There is no evidence of pericardial effusion. Mitral Valve: The mitral valve is normal in structure. Trace mitral valve  regurgitation is seen. Tricuspid Valve: The tricuspid valve is normal. Mild tricuspid  regurgitation is visualized. The tricuspid regurgitant velocity is 2.96  m/s, and  with an assumed right atrial pressure of 10 mmHg, the estimated  right ventricular systolic pressure is mildly elevated at 44.9 mmHg. Aortic Valve: The aortic valve is normal. Mild aortic valve sclerosis is  present, with no evidence of aortic valve stenosis. Pulmonic Valve: The pulmonic valve is normal. Trace pulmonic valve  regurgitation. Aorta: The aortic root and ascending aorta are structurally normal, with  no evidence of dilitation. There is mild dilatation of the ascending  aorta.  [id number] Ida Rogue MD Electronically signed by [id number] Ida Rogue MD Signature Date/Time: 12/04/2013/8:07:24 AM  *** Final ***  IMPRESSION: .    Verified By: Minna Merritts, M.D., MD  Routine Chem:  20-Apr-15 04:27   Glucose, Serum  125  BUN 7  Creatinine (comp)  1.67  Sodium, Serum  132  Potassium, Serum 4.0  Chloride, Serum 101  CO2, Serum 21  Calcium (Total), Serum  7.9  Anion Gap 10  Osmolality (calc) 264  eGFR (African American)  51  eGFR (Non-African American)  44 (eGFR values <35m/min/1.73 m2 may be an indication of chronic kidney disease (CKD). Calculated eGFR is useful in patients with stable renal function. The eGFR calculation will not be reliable in acutely ill patients when serum creatinine is changing rapidly. It is not useful in  patients on dialysis. The eGFR calculation may not be applicable to patients at the low and high extremes of body sizes, pregnant women, and vegetarians.)  Ethanol, S. 6  Ethanol % (comp) 0.006 (Result(s) reported on 03 Dec 2013 at 04:56AM.)  21-Apr-15 03:50   Creatinine (comp) 0.80  22-Apr-15 04:46   Creatinine (comp) 0.66  23-Apr-15 04:22   Glucose, Serum  127  BUN 12  Creatinine (comp)  0.54  Sodium, Serum  134  Potassium, Serum 4.1  Chloride, Serum 103  CO2, Serum 29  Calcium (Total), Serum  8.1  Anion Gap  2  Osmolality (calc) 270  eGFR (African American) >60  eGFR (Non-African American) >60 (eGFR values  <614mmin/1.73 m2 may be an indication of chronic kidney disease (CKD). Calculated eGFR is useful in patients with stable renal function. The eGFR calculation will not be reliable in acutely ill patients when serum creatinine is changing rapidly. It is not useful in  patients on dialysis. The eGFR calculation may not be applicable to patients at the low and high extremes of body sizes, pregnant women, and vegetarians.)  Urine Drugs:  [id number] Tricyclic Antidepressant, Ur Qual (comp) POSITIVE (Result(s) reported on 03 Dec 2013 at 07:07AM.)  Amphetamines, Urine Qual. NEGATIVE  MDMA, Urine Qual. NEGATIVE  Cocaine Metabolite, Urine Qual. NEGATIVE  Opiate, Urine qual NEGATIVE  Phencyclidine, Urine Qual. NEGATIVE  Cannabinoid, Urine Qual. NEGATIVE  Barbiturates, Urine Qual. NEGATIVE  Benzodiazepine, Urine Qual. POSITIVE (----------------- The URINE DRUG SCREEN provides only a preliminary, unconfirmed analytical test result and should not be used for non-medical  purposes.  Clinical consideration and professional judgment should be  applied to any positive drug screen result due to possible interfering substances.  A more specific alternate chemical method must be used in order to obtain a confirmed analytical result.  Gas chromatography/mass spectrometry (GC/MS) is the preferred confirmatory method.)  Methadone, Urine Qual. NEGATIVE  Cardiac:  20-Apr-15 04:27   CK, Total  1548 (39-308 NOTE: NEW REFERENCE RANGE  09/17/2013)  CPK-MB, Serum  15.5 (Result(s) reported on 03 Dec 2013 at 01:26PM.)  Troponin I < 0.02 (0.00-0.05 0.05 ng/mL or less: NEGATIVE  Repeat testing in 3-6 hrs  if clinically indicated. >0.05 ng/mL: POTENTIAL  MYOCARDIAL INJURY. Repeat  testing in 3-6 hrs if  clinically indicated. NOTE: An increase or decrease  of 30% or more on serial  testing suggests a  clinically important change)    09:22   CK, Total  15645 (39-308 NOTE: NEW REFERENCE RANGE   09/17/2013)    13:55   CK, Total  18778 (39-308  NOTE: NEW REFERENCE RANGE  09/17/2013)  21-Apr-15 13:12   CK, Total  11566 (39-308 NOTE: NEW REFERENCE RANGE  09/17/2013)  22-Apr-15 04:46   CK, Total  6849 (39-308 NOTE: NEW REFERENCE RANGE  09/17/2013)  23-Apr-15 04:22   CK, Total  1704 (39-308 NOTE: NEW REFERENCE RANGE  09/17/2013)  Routine UA:  20-Apr-15 04:27   Color (UA) Straw  Clarity (UA) Clear  Glucose (UA) Negative  Bilirubin (UA) Negative  Ketones (UA) Negative  Specific Gravity (UA) 1.001  Blood (UA) Negative  pH (UA) 7.0  Protein (UA) Negative  Nitrite (UA) Negative  Leukocyte Esterase (UA) Negative (Result(s) reported on 03 Dec 2013 at 07:00AM.)  RBC (UA) NONE SEEN  WBC (UA) NONE SEEN  Bacteria (UA) NONE SEEN  Epithelial Cells (UA) NONE SEEN  Result(s) reported on 03 Dec 2013 at 07:00AM.  Routine Hem:  20-Apr-15 04:27   WBC (CBC)  19.5  RBC (CBC) 5.61  Hemoglobin (CBC) 15.4  Hematocrit (CBC) 47.8  Platelet Count (CBC) 327 (Result(s) reported on 03 Dec 2013 at 04:41AM.)  MCV 85  MCH 27.4  MCHC 32.2  RDW  15.7   PERTINENT RADIOLOGY STUDIES: MRI:    22-Apr-15 10:43, MRI Brain Without Contrast  MRI Brain Without Contrast   REASON FOR EXAM:    right sided weakness  COMMENTS:       PROCEDURE: MR  - MR BRAIN WO CONTRAST  - Dec 05 2013 10:43AM     CLINICAL DATA:  Right-sided weakness.    EXAM:  MRI HEAD WITHOUT CONTRAST    TECHNIQUE:  Multiplanar, multiecho pulse sequencesof the brain and surrounding  structures were obtained without intravenous contrast.    COMPARISON:  CT head without contrast 4/21/ 2015.  FINDINGS:  Significant T2 hyperintensity is present in the globus pallidus  bilaterally. There is some diffusion signal abnormality without  marked restriction of the ADC.    Moderate generalized atrophy and extensive white matter change is  evident bilaterally. No other focal lesions are present as in the  globus pallidus.    The  ventricles are proportionate to the degree of atrophy. No  significant extra-axial fluid collection is present. Flow is present  in the major intracranial arteries. The globes and orbits are  intact. Mild mucosal thickening is present in the left maxillary  sinus. There is mild mucosal thickening in the sphenoid sinuses  bilaterally. The paranasal sinuses are otherwise clear. There is  some fluid in the a right mastoid air cells. No obstructing  nasopharyngeal lesion is evident.     IMPRESSION:  1. Focal T2 signal abnormalityin the globus pallidus bilaterally.  This is compatible with a toxic encephalopathy, most commonly  related to carbon monoxide. Other causes of anoxia or rare metabolic  disorder such as Redmond Pulling disease could also be considered.  2. Atrophy and diffuse white matter disease is likely related to  chronic microvascular ischemia.      Electronically Signed    By: Lawrence Santiago M.D.    On: 12/05/2013 11:06     Verified By: Resa Miner. MATTERN, M.D.,  CT:    20-Apr-15 05:27, CT Head Without Contrast  CT Head Without Contrast   REASON FOR EXAM:    UNRESPONSIVE  COMMENTS:       PROCEDURE: CT  - CT HEAD WITHOUT CONTRAST  - Dec 03 2013  5:27AM     CLINICAL DATA:  Patient was unresponsive after having multiple be  years and unknown amount of medications.  EXAM:  CT HEAD WITHOUT CONTRAST    TECHNIQUE:  Contiguous axial images were obtained from the base of the skull  through the vertex without intravenous contrast.  COMPARISON:  CT HEAD W/O CM dated 07/25/2005    FINDINGS:  Diffuse cerebral atrophy. Low-attenuation changesin the deep white  matter consistent with small vessel ischemia. No ventricular  dilatation. No mass effect or midline shift. No abnormal extra-axial  fluid collections. Gray-white matter junctions are distinct. Basal  cisterns are not effaced. No evidence of acute intracranial  hemorrhage. No depressed skull fractures. Visualized  paranasal  sinuses and mastoid air cells are not opacified.     IMPRESSION:  No acute intracranial abnormalities. Chronic atrophy and small  vessel ischemic changes.  Electronically Signed    By: Lucienne Capers M.D.    On: 12/03/2013 05:32         Verified By: Neale Burly, M.D.,   Pertinent Past History:  Pertinent Past History Coronary artery disease status post PCI, COPD, lives on 3.5 liters of oxygen via nasal cannula, ongoing tobacco abuse, type 2 diabetes mellitus, paroxysmal atrial fibrillation, the patient is not on any anticoagulation, osteoarthritis, congestive heart failure with last documented ejection fraction 55%, history of alcohol abuse and tobacco abuse.   Hospital Course:  Hospital Course 59 year old male  admitted with decreased in responsivenss, high etoh level, possible multiple meds od,  1.   acute encephalopathy due to etoh intoxiation as well as possible over dose,given supportive care and over 2-3 days became more awake.  2.  Rhabdomyolysis,  CPK was high- with ivf, came down. 3.  Diabetes mellitus. ssi,  oral treatment on hold , BS stable. 4.  Paroxysmal atrial fibrillation. asa,  no good anticoagulation cadidate. 5.  History of coronary artery disease. asa, BB 6.  Chronic obstructive pulmonary disease. with acute exaceberation, nebs, iv steroids- swiched to oral ,have chronic Oxygen use at home 3 ltr/min , and abx- stopped Abx as finished 5 days. 7. leukocytosis - iv abx follow no fever- may be steroid indused. 8.  elevated lft's in setting of rhabdo, coming down- likely alcohol indused. 9.  right wrist pain swelling xray to r/o fx- negative. 10.  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 EEG  normal. weakness improved somewhat.   Condition on Discharge Guarded   Code Status:  Code Status Full Code   DISCHARGE INSTRUCTIONS  HOME MEDS:  Medication Reconciliation: Patient's Home Medications at Discharge:     Medication Instructions  nexium 40 mg oral delayed release capsule  1 cap(s) orally once a day   proventil hfa cfc free 90 mcg/inh inhalation aerosol  2 puff(s) inhaled 4 times a day   spiriva 18 mcg inhalation capsule   inhaled once a day   trilipix 135 mg oral delayed release capsule  1 cap(s) orally once a day   atorvastatin 80 mg oral tablet  1 tab(s) orally once a day (at bedtime)   cyclobenzaprine 10 mg oral tablet  1 tab(s) orally 3 times a day   prednisone 20 mg oral tablet  1 tab(s) orally once a day x 4 days   oxycodone 20 mg oral tablet, extended release  1 tab(s) orally every 12 hours   acetaminophen-oxycodone 325 mg-10 mg oral tablet  1 tab(s) orally every 6 hours, As Needed, pain , As needed, pain   lisinopril 10  mg oral tablet  1 tab(s) orally once a day   aspirin 81 mg oral tablet, chewable  1 tab(s) orally once a day   risperidone 2 mg oral tablet  1 tab(s) orally once a day (at bedtime)   imipramine 50 mg oral tablet  2 tab(s) orally once a day (at bedtime)   imipramine 50 mg oral tablet  1 tab(s) orally once a day (in the morning)   diazepam 5 mg oral tablet  1 tab(s) orally every 8 hours, As Needed, anxiety , As needed, anxiety   trazodone 100 mg oral tablet  2 tab(s) orally once a day (at bedtime)    STOP TAKING THE FOLLOWING MEDICATION(S):    amlodipine 5 mg oral tablet: 1 tab(s) orally once a day zolpidem 10 mg oral tablet: 1/2 or 1 tab(s) orally once a day (at bedtime) metformin 500 mg oral tablet: 1 tab(s) orally 2 times a day ibuprofen 800 mg oral tablet: 1 tab(s) orally 3 times a day, As Needed - for Pain  Physician's Instructions:  Home Oxygen? Yes   Oxygen delivery at home: 2L  Nasal Cannula   Diet Low Sodium  Carbohydrate Controlled (ADA) Diet   Activity Limitations As tolerated   Return to Work Not Applicable   Time frame for Follow Up Appointment 1-2 weeks    Other Comments He already have appointment with Ortho clinic for his knee arthritis- follow with that. Follow with primary psychiatrist.   Electronic Signatures: Vaughan Basta (MD)  (Signed 25-Apr-15 19:57)  Authored: ADMISSION DATE AND DIAGNOSIS, CHIEF COMPLAINT/HPI, Allergies, PERTINENT LABS, PERTINENT RADIOLOGY STUDIES, [place], PATIENT INSTRUCTIONS   Last Updated: 25-Apr-15 19:57 by Vaughan Basta (MD)

## 2014-12-07 NOTE — Consult Note (Signed)
Psychiatry: This is a consult for this patient with a history of anxiety and a past history of substance abuse.  Admitted to the hospital for shortness of breath causing altered mental status.  Consult because of concern especially on the part of the family about suicidality.  Information obtained from the patient and the chart and conversation with nursing as well as conversation with the patient's son.  Patient states that he came into the hospital because he was coughing too much and couldn't breathe well.  He says he had been sick recently.  He had been run down and sick because of respiratory problems.  He denies that he has been depressed or sad.  Denies feeling hopeless.  Denies any suicidal thoughts at all.  Denies psychotic symptoms.  He says he feels certain he was taking all of his medicines as prescribed.  Denies that he had been overusing or misusing his medicine.  Denies any recent use of alcohol.  His son expressed concern to the treatment team that the patient was unsafe at home.  He believes that the patient does not do a good job managing his own medication.  He suspects that the patient may be intentionally overdosing on medicine although he has no proof of that.  He admits that his father had not made any suicidal statements recently. psychiatric history for chronic anxiety.  Has seen Dr. Elesa Massed for about 20 years.  Medications have been stable for a long time.  Takes daily Valium but the dose has not changed in a long time.  Patient states that is anxiety is under pretty good control.  He has a past history of abuse of alcohol but says he hasn't been drinking at all in many months.  There is no definite past history of suicide attempts. history: Patient is currently living on his own although he has a girlfriend who stays with him part time.  Patient denies any feeling that the girlfriend is trying to harm him or do anything not in his best interest.  Denies that she ever wrote a suicide note  for him which is one of the allegations that his son makes.  Patient says he felt good when he came home from rehabilitation several months ago and feels that he continues to function well. history: Severe COPD oxygen dependent.  Problems.  Uses a wheelchair at home. history: Positive for many members with substance abuse of systems: Denies depression.  Denies suicidal ideation.  Denies panic attacks.  Denies hallucinations.  He continues to have mild shortness of breath but is feeling less sick than he was before.  Rest of the full review of systems negative. Mental status exam: Reasonably well-groomed gentleman looks his stated age.  Cooperative with the interview.  Alert and oriented.  No sign of confusion or delirium.  Good eye contact.  Normal psychomotor activity except for a bit of a tremor.  Speech normal rate tone and volume affect euthymic and reactive mood stated as okay.  Thoughts are lucid no loosening of associations or delusions.  He is alert and oriented 4.  Adequate judgment and insight.  Short-term memory intact 2 out of 3 objects at 3 minutes longer term memory grossly intact normal fund of knowledge. sign of alcohol on admission.  Labs showed positive benzodiazepines and Tri-Cyclen tics.  The try cyclic antidepressants finding is probably a cross reaction from other prescription medicines such as Flexeril.  No sign of any other substance abuse.  Nurses report that his prescription  bottles had the proper number of pills in the him meeting there was no hint of overuse.. with chronic anxiety and multiple medical problems.  There is no direct evidence I have that there was anything suicidal about this admission.  Patient is not reporting symptoms of depression.  He is able to articulate an adequate plan for is home care.  Seems to be capable of making reasonable decisions right now.  Nothing about him appears to suggest suicidality or acute dangerousness.  No indication for psychiatric  hospitalization. completed.  Patient is aware of the dangers of overuse of medicine especially with his medical problems.  He plans to continue to follow up with Dr. Elesa MassedWard.  Counseling done about being careful with overuse of sedating medicine.  No further psychiatric treatment at this point. Delirium due to medical problem hypoxia now resolved.  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  Her graft total time spent 50 minutes.  Electronic Signatures: Clapacs, Jackquline DenmarkJohn T (MD)  (Signed on 26-Sep-15 12:01)  Authored  Last Updated: 26-Sep-15 12:01 by Audery Amellapacs, John T (MD)

## 2015-03-29 ENCOUNTER — Emergency Department: Payer: Medicaid Other

## 2015-03-29 ENCOUNTER — Emergency Department
Admission: EM | Admit: 2015-03-29 | Discharge: 2015-03-29 | Disposition: A | Discharge status: Home / Self Care | Payer: Medicaid Other | Attending: Emergency Medicine | Admitting: Emergency Medicine

## 2015-03-29 ENCOUNTER — Encounter: Payer: Self-pay | Admitting: Emergency Medicine

## 2015-03-29 DIAGNOSIS — F419 Anxiety disorder, unspecified: Secondary | ICD-10-CM | POA: Diagnosis not present

## 2015-03-29 DIAGNOSIS — R000 Tachycardia, unspecified: Secondary | ICD-10-CM | POA: Insufficient documentation

## 2015-03-29 DIAGNOSIS — R079 Chest pain, unspecified: Secondary | ICD-10-CM | POA: Insufficient documentation

## 2015-03-29 DIAGNOSIS — Z7982 Long term (current) use of aspirin: Secondary | ICD-10-CM | POA: Diagnosis not present

## 2015-03-29 DIAGNOSIS — Z720 Tobacco use: Secondary | ICD-10-CM | POA: Diagnosis not present

## 2015-03-29 DIAGNOSIS — J441 Chronic obstructive pulmonary disease with (acute) exacerbation: Secondary | ICD-10-CM | POA: Diagnosis not present

## 2015-03-29 DIAGNOSIS — Z79899 Other long term (current) drug therapy: Secondary | ICD-10-CM | POA: Diagnosis not present

## 2015-03-29 DIAGNOSIS — E119 Type 2 diabetes mellitus without complications: Secondary | ICD-10-CM | POA: Diagnosis not present

## 2015-03-29 HISTORY — DX: Chronic obstructive pulmonary disease, unspecified: J44.9

## 2015-03-29 LAB — CBC
HCT: 44.5 % (ref 40.0–52.0)
HEMOGLOBIN: 14.7 g/dL (ref 13.0–18.0)
MCH: 27.5 pg (ref 26.0–34.0)
MCHC: 33.1 g/dL (ref 32.0–36.0)
MCV: 83.2 fL (ref 80.0–100.0)
PLATELETS: 310 10*3/uL (ref 150–440)
RBC: 5.35 MIL/uL (ref 4.40–5.90)
RDW: 15.2 % — ABNORMAL HIGH (ref 11.5–14.5)
WBC: 8.6 10*3/uL (ref 3.8–10.6)

## 2015-03-29 LAB — BASIC METABOLIC PANEL
Anion gap: 11 (ref 5–15)
BUN: <5 mg/dL — ABNORMAL LOW (ref 6–20)
CALCIUM: 9 mg/dL (ref 8.9–10.3)
CO2: 31 mmol/L (ref 22–32)
Chloride: 100 mmol/L — ABNORMAL LOW (ref 101–111)
Creatinine, Ser: 0.94 mg/dL (ref 0.61–1.24)
GFR calc non Af Amer: >60 mL/min (ref >60)
GLUCOSE: 115 mg/dL — AB (ref 65–99)
Potassium: 3.2 mmol/L — ABNORMAL LOW (ref 3.5–5.1)
SODIUM: 142 mmol/L (ref 135–145)

## 2015-03-29 LAB — TROPONIN I: Troponin I: <0.03 ng/mL (ref <0.031)

## 2015-03-29 MED ORDER — GI COCKTAIL ~~LOC~~
ORAL | Status: AC
  Administered 2015-03-29: 30 mL via ORAL
  Filled 2015-03-29: qty 30

## 2015-03-29 MED ORDER — GI COCKTAIL ~~LOC~~
30.0000 mL | Freq: Once | ORAL | Status: AC
  Administered 2015-03-29: 30 mL via ORAL

## 2015-03-29 NOTE — ED Provider Notes (Signed)
[facility] Emergency Department Provider Note  ____________________________________________  Time seen: 7:20 AM  I have reviewed the triage vital signs and the nursing notes.   HISTORY  Chief Complaint Chest Pain and Shortness of Breath    HPI Kristopher Powell is a 59 y.o. male who presents with complaints of mild to moderate chest pain for approximately 1 week that comes and goes. The pain occurred this morning so he came to the emergency department to be evaluated. He denies chest pain currently. He also reports a history of COPD diabetes and high blood pressure. He reports some mild shortness of breath but notes this is chronic for him. He continues to smoke. The pain does not travel     Past Medical History  Diagnosis Date  . COPD (chronic obstructive pulmonary disease)     There are no active problems to display for this patient.   No past surgical history on file.  Current Outpatient Rx  Name  Route  Sig  Dispense  Refill  . amLODipine (NORVASC) 5 MG tablet   Oral   Take 5 mg by mouth daily.         Marland Kitchen aspirin EC 81 MG tablet   Oral   Take 81 mg by mouth daily.         Marland Kitchen atorvastatin (LIPITOR) 80 MG tablet   Oral   Take 80 mg by mouth daily.         . diazepam (VALIUM) 10 MG tablet   Oral   Take 10 mg by mouth 3 (three) times daily.         Marland Kitchen esomeprazole (NEXIUM) 40 MG capsule   Oral   Take 40 mg by mouth daily.         . furosemide (LASIX) 20 MG tablet   Oral   Take 10 mg by mouth daily.         Marland Kitchen gabapentin (NEURONTIN) 300 MG capsule   Oral   Take 1,200 mg by mouth 3 (three) times daily.         . metFORMIN (GLUCOPHAGE-XR) 500 MG 24 hr tablet   Oral   Take 1 tablet by mouth 2 (two) times daily.         . metoprolol succinate (TOPROL XL) 25 MG 24 hr tablet   Oral   Take 25 mg by mouth daily.         . risperiDONE (RISPERDAL) 2 MG tablet   Oral   Take 2 mg by mouth at bedtime.         Marland Kitchen  tiotropium (SPIRIVA HANDIHALER) 18 MCG inhalation capsule   Inhalation   Place 1 capsule into inhaler and inhale daily.         . traZODone (DESYREL) 100 MG tablet   Oral   Take 200 mg by mouth at bedtime.           Allergies Review of patient's allergies indicates no known allergies.  History reviewed. No pertinent family history.  Social History Social History  Substance Use Topics  . Smoking status: Current Every Day Smoker -- 2.00 packs/day for 40 years    Types: Cigarettes  . Smokeless tobacco: None  . Alcohol Use: No    Review of Systems  Constitutional: Negative for fever. Eyes: Negative for visual changes. ENT: Negative for sore throat Cardiovascular: Positive  chest pain Respiratory: Positive shortness of breath Gastrointestinal: Negative for abdominal pain, vomiting and diarrhea. Genitourinary: Negative for dysuria. Musculoskeletal:  Negative for back pain. Skin: Negative for rash. Neurological: Negative for headaches or focal weakness Psychiatric: Mild anxiety    ____________________________________________   PHYSICAL EXAM:  VITAL SIGNS: ED Triage Vitals  Enc Vitals Group     BP 03/29/15 0613 132/73 mmHg     Pulse Rate 03/29/15 0613 103     Resp 03/29/15 0613 22     Temp 03/29/15 0613 97.8 F (36.6 C)     Temp Source 03/29/15 0613 Oral     SpO2 03/29/15 0613 90 %     Weight 03/29/15 0613 220 lb (99.791 kg)     Height 03/29/15 0613 6' (1.829 m)     Head Cir --      Peak Flow --      Pain Score 03/29/15 0615 7     Pain Loc --      Pain Edu? --      Excl. in GC? --      Constitutional: Alert and oriented. Well appearing and in no distress. Eyes: Conjunctivae are normal.  ENT   Head: Normocephalic and atraumatic.   Mouth/Throat: Mucous membranes are moist. Cardiovascular: Mild tachycardia, regular rhythm. Normal and symmetric distal pulses are present in all extremities. No murmurs, rubs, or gallops. Respiratory: Normal  respiratory effort without tachypnea nor retractions. Breath sounds are clear and equal bilaterally.   Gastrointestinal: Soft and non-tender in all quadrants. No distention. There is no CVA tenderness. Genitourinary: deferred Musculoskeletal: Nontender with normal range of motion in all extremities. No lower extremity tenderness nor edema. Neurologic:  Normal speech and language. No gross focal neurologic deficits are appreciated. Skin:  Skin is warm, dry and intact. No rash noted. Psychiatric: Flat affect patient seems anxious to leave. Patient exhibits appropriate insight and judgment.  ____________________________________________    LABS (pertinent positives/negatives)  Labs Reviewed  BASIC METABOLIC PANEL - Abnormal; Notable for the following:    Potassium 3.2 (*)    Chloride 100 (*)    Glucose, Bld 115 (*)    BUN <5 (*)    All other components within normal limits  CBC - Abnormal; Notable for the following:    RDW 15.2 (*)    All other components within normal limits  TROPONIN I    ____________________________________________   EKG  ED ECG REPORT I, Jene Every, the attending physician, personally viewed and interpreted this ECG.   Date: 03/29/2015  EKG Time: 6:18 AM  Rate: 99  Rhythm: normal sinus rhythm  Axis: Normal axis  Intervals:none  ST&T Change: Nonspecific ST changes in the lateral leads unchanged from September 2015   ____________________________________________    RADIOLOGY I have personally reviewed any xrays that were ordered on this patient: Chest x-ray unremarkable  ____________________________________________   PROCEDURES  Procedure(s) performed: none  Critical Care performed: none  ____________________________________________   INITIAL IMPRESSION / ASSESSMENT AND PLAN / ED COURSE  Pertinent labs & imaging results that were available during my care of the patient were reviewed by me and considered in my medical decision making  (see chart for details).  Is difficult to get a sense of how seriously patient is taking his complaints given he continues putting his clothes on and wants to leave despite our efforts to convince him to stay while we complete her workup. Thus far his EKG is unchanged from prior and his exam is relatively benign. We'll try a GI cocktail because he does seem to take a lot of BC powder  ----------------------------------------- 8:21 AM on 03/29/2015 -----------------------------------------  Patient's labs are normal, he is unwilling to stay for a second troponin despite my urging's. He has a known history of heart disease and he does have decision capacity and I explained to him the risks of leaving prior to finishing workup and he understands and accepts those risks  ____________________________________________   FINAL CLINICAL IMPRESSION(S) / ED DIAGNOSES  Final diagnoses:  Chest pain, unspecified chest pain type     Jene Every, MD 03/29/15 [phone]

## 2015-03-29 NOTE — Discharge Instructions (Signed)

## 2015-04-13 ENCOUNTER — Emergency Department
Admission: EM | Admit: 2015-04-13 | Discharge: 2015-04-13 | Disposition: A | Discharge status: Home / Self Care | Payer: Medicaid Other | Attending: Emergency Medicine | Admitting: Emergency Medicine

## 2015-04-13 ENCOUNTER — Encounter: Payer: Self-pay | Admitting: Emergency Medicine

## 2015-04-13 ENCOUNTER — Other Ambulatory Visit: Payer: Self-pay

## 2015-04-13 ENCOUNTER — Emergency Department: Payer: Medicaid Other

## 2015-04-13 DIAGNOSIS — J441 Chronic obstructive pulmonary disease with (acute) exacerbation: Secondary | ICD-10-CM | POA: Diagnosis not present

## 2015-04-13 DIAGNOSIS — I10 Essential (primary) hypertension: Secondary | ICD-10-CM | POA: Diagnosis not present

## 2015-04-13 DIAGNOSIS — E119 Type 2 diabetes mellitus without complications: Secondary | ICD-10-CM | POA: Insufficient documentation

## 2015-04-13 DIAGNOSIS — Z720 Tobacco use: Secondary | ICD-10-CM | POA: Diagnosis not present

## 2015-04-13 DIAGNOSIS — R000 Tachycardia, unspecified: Secondary | ICD-10-CM | POA: Insufficient documentation

## 2015-04-13 DIAGNOSIS — R079 Chest pain, unspecified: Secondary | ICD-10-CM

## 2015-04-13 DIAGNOSIS — J449 Chronic obstructive pulmonary disease, unspecified: Secondary | ICD-10-CM

## 2015-04-13 HISTORY — DX: Type 2 diabetes mellitus without complications: E11.9

## 2015-04-13 HISTORY — DX: Essential (primary) hypertension: I10

## 2015-04-13 HISTORY — DX: Heart failure, unspecified: I50.9

## 2015-04-13 HISTORY — DX: Acute myocardial infarction, unspecified: I21.9

## 2015-04-13 LAB — TROPONIN I

## 2015-04-13 LAB — BASIC METABOLIC PANEL
ANION GAP: 11 (ref 5–15)
BUN: <5 mg/dL — ABNORMAL LOW (ref 6–20)
CALCIUM: 8.5 mg/dL — AB (ref 8.9–10.3)
CO2: 23 mmol/L (ref 22–32)
Chloride: 97 mmol/L — ABNORMAL LOW (ref 101–111)
Creatinine, Ser: 0.68 mg/dL (ref 0.61–1.24)
GLUCOSE: 97 mg/dL (ref 65–99)
POTASSIUM: 3.2 mmol/L — AB (ref 3.5–5.1)
Sodium: 131 mmol/L — ABNORMAL LOW (ref 135–145)

## 2015-04-13 LAB — CBC
HEMATOCRIT: 44.3 % (ref 40.0–52.0)
HEMOGLOBIN: 14.6 g/dL (ref 13.0–18.0)
MCH: 27.5 pg (ref 26.0–34.0)
MCHC: 33 g/dL (ref 32.0–36.0)
MCV: 83.5 fL (ref 80.0–100.0)
Platelets: 312 10*3/uL (ref 150–440)
RBC: 5.31 MIL/uL (ref 4.40–5.90)
RDW: 15.8 % — ABNORMAL HIGH (ref 11.5–14.5)
WBC: 14.8 10*3/uL — ABNORMAL HIGH (ref 3.8–10.6)

## 2015-04-13 MED ORDER — IPRATROPIUM-ALBUTEROL 0.5-2.5 (3) MG/3ML IN SOLN
3.0000 mL | Freq: Once | RESPIRATORY_TRACT | Status: AC
  Administered 2015-04-13: 3 mL via RESPIRATORY_TRACT
  Filled 2015-04-13: qty 3

## 2015-04-13 NOTE — ED Notes (Signed)
Patient present to ED via EMS from home c/o of intermittent chest pain and shortness of breath x 1 day. Patient reports "I just don't feel good" x 3 days. Patient given 324 Aspirin at 16:20, and sublingual Nitro spray at 16:27. Patient has history of COPD, reports chest pain relieved a little after Nitro. Patient alert and oriented x 4, respirations even and unlabored, patient speaking in complete sentences.

## 2015-04-13 NOTE — Discharge Instructions (Signed)
Chest Pain (Nonspecific) °It is often hard to give a specific diagnosis for the cause of chest pain. There is always a chance that your pain could be related to something serious, such as a heart attack or a blood clot in the lungs. You need to follow up with your health care provider for further evaluation. °CAUSES  °· Heartburn. °· Pneumonia or bronchitis. °· Anxiety or stress. °· Inflammation around your heart (pericarditis) or lung (pleuritis or pleurisy). °· A blood clot in the lung. °· A collapsed lung (pneumothorax). It can develop suddenly on its own (spontaneous pneumothorax) or from trauma to the chest. °· Shingles infection (herpes zoster virus). °The chest wall is composed of bones, muscles, and cartilage. Any of these can be the source of the pain. °· The bones can be bruised by injury. °· The muscles or cartilage can be strained by coughing or overwork. °· The cartilage can be affected by inflammation and become sore (costochondritis). °DIAGNOSIS  °Lab tests or other studies may be needed to find the cause of your pain. Your health care provider may have you take a test called an ambulatory electrocardiogram (ECG). An ECG records your heartbeat patterns over a 24-hour period. You may also have other tests, such as: °· Transthoracic echocardiogram (TTE). During echocardiography, sound waves are used to evaluate how blood flows through your heart. °· Transesophageal echocardiogram (TEE). °· Cardiac monitoring. This allows your health care provider to monitor your heart rate and rhythm in real time. °· Holter monitor. This is a portable device that records your heartbeat and can help diagnose heart arrhythmias. It allows your health care provider to track your heart activity for several days, if needed. °· Stress tests by exercise or by giving medicine that makes the heart beat faster. °TREATMENT  °· Treatment depends on what may be causing your chest pain. Treatment may include: °· Acid blockers for  heartburn. °· Anti-inflammatory medicine. °· Pain medicine for inflammatory conditions. °· Antibiotics if an infection is present. °· You may be advised to change lifestyle habits. This includes stopping smoking and avoiding alcohol, caffeine, and chocolate. °· You may be advised to keep your head raised (elevated) when sleeping. This reduces the chance of acid going backward from your stomach into your esophagus. °Most of the time, nonspecific chest pain will improve within 2-3 days with rest and mild pain medicine.  °HOME CARE INSTRUCTIONS  °· If antibiotics were prescribed, take them as directed. Finish them even if you start to feel better. °· For the next few days, avoid physical activities that bring on chest pain. Continue physical activities as directed. °· Do not use any tobacco products, including cigarettes, chewing tobacco, or electronic cigarettes. °· Avoid drinking alcohol. °· Only take medicine as directed by your health care provider. °· Follow your health care provider's suggestions for further testing if your chest pain does not go away. °· Keep any follow-up appointments you made. If you do not go to an appointment, you could develop lasting (chronic) problems with pain. If there is any problem keeping an appointment, call to reschedule. °SEEK MEDICAL CARE IF:  °1. Your chest pain does not go away, even after treatment. °2. You have a rash with blisters on your chest. °3. You have a fever. °SEEK IMMEDIATE MEDICAL CARE IF:  °· You have increased chest pain or pain that spreads to your arm, neck, jaw, back, or abdomen. °· You have shortness of breath. °· You have an increasing cough, or you cough   up blood. °· You have severe back or abdominal pain. °· You feel nauseous or vomit. °· You have severe weakness. °· You faint. °· You have chills. °This is an emergency. Do not wait to see if the pain will go away. Get medical help at once. Call your local emergency services (911 in [place] Do not drive  yourself to the hospital. °MAKE SURE YOU:  °· Understand these instructions. °· Will watch your condition. °· Will get help right away if you are not doing well or get worse. °Document Released: 05/12/2005 Document Revised: 08/07/2013 Document Reviewed: 03/07/2008 °[employer]® Patient Information ©2015 [employer]. This information is not intended to replace advice given to you by your health care provider. Make sure you discuss any questions you have with your health care provider. ° °Chronic Obstructive Pulmonary Disease °Chronic obstructive pulmonary disease (COPD) is a common lung condition in which airflow from the lungs is limited. COPD is a general term that can be used to describe many different lung problems that limit airflow, including both chronic bronchitis and emphysema.  If you have COPD, your lung function will probably never return to normal, but there are measures you can take to improve lung function and make yourself feel better.  °CAUSES  °· Smoking (common).   °· Exposure to secondhand smoke.   °· Genetic problems. °· Chronic inflammatory lung diseases or recurrent infections. °SYMPTOMS  °· Shortness of breath, especially with physical activity.   °· Deep, persistent (chronic) cough with a large amount of thick mucus.   °· Wheezing.   °· Rapid breaths (tachypnea).   °· Gray or bluish discoloration (cyanosis) of the skin, especially in fingers, toes, or lips.   °· Fatigue.   °· Weight loss.   °· Frequent infections or episodes when breathing symptoms become much worse (exacerbations).   °· Chest tightness. °DIAGNOSIS  °Your health care provider will take a medical history and perform a physical examination to make the initial diagnosis.  Additional tests for COPD may include:  °· Lung (pulmonary) function tests. °· Chest X-ray. °· CT scan. °· Blood tests. °TREATMENT  °Treatment available to help you feel better when you have COPD includes:  °· Inhaler and nebulizer medicines. These help manage  the symptoms of COPD and make your breathing more comfortable. °· Supplemental oxygen. Supplemental oxygen is only helpful if you have a low oxygen level in your blood.   °· Exercise and physical activity. These are beneficial for nearly all people with COPD. Some people may also benefit from a pulmonary rehabilitation program. °HOME CARE INSTRUCTIONS  °· Take all medicines (inhaled or pills) as directed by your health care provider. °· Avoid over-the-counter medicines or cough syrups that dry up your airway (such as antihistamines) and slow down the elimination of secretions unless instructed otherwise by your health care provider.   °· If you are a smoker, the most important thing that you can do is stop smoking. Continuing to smoke will cause further lung damage and breathing trouble. Ask your health care provider for help with quitting smoking. He or she can direct you to community resources or hospitals that provide support. °· Avoid exposure to irritants such as smoke, chemicals, and fumes that aggravate your breathing. °· Use oxygen therapy and pulmonary rehabilitation if directed by your health care provider. If you require home oxygen therapy, ask your health care provider whether you should purchase a pulse oximeter to measure your oxygen level at home.   °· Avoid contact with individuals who have a contagious illness. °· Avoid extreme temperature and humidity changes. °·   Eat healthy foods. Eating smaller, more frequent meals and resting before meals may help you maintain your strength. °· Stay active, but balance activity with periods of rest. Exercise and physical activity will help you maintain your ability to do things you want to do. °· Preventing infection and hospitalization is very important when you have COPD. Make sure to receive all the vaccines your health care provider recommends, especially the pneumococcal and influenza vaccines. Ask your health care provider whether you need a pneumonia  vaccine. °· Learn and use relaxation techniques to manage stress. °· Learn and use controlled breathing techniques as directed by your health care provider. Controlled breathing techniques include:   °¨ Pursed lip breathing. Start by breathing in (inhaling) through your nose for 1 second. Then, purse your lips as if you were going to whistle and breathe out (exhale) through the pursed lips for 2 seconds.   °¨ Diaphragmatic breathing. Start by putting one hand on your abdomen just above your waist. Inhale slowly through your nose. The hand on your abdomen should move out. Then purse your lips and exhale slowly. You should be able to feel the hand on your abdomen moving in as you exhale.   °· Learn and use controlled coughing to clear mucus from your lungs. Controlled coughing is a series of short, progressive coughs. The steps of controlled coughing are:   °4. Lean your head slightly forward.   °5. Breathe in deeply using diaphragmatic breathing.   °6. Try to hold your breath for 3 seconds.   °7. Keep your mouth slightly open while coughing twice.   °8. Spit any mucus out into a tissue.   °9. Rest and repeat the steps once or twice as needed. °SEEK MEDICAL CARE IF:  °· You are coughing up more mucus than usual.   °· There is a change in the color or thickness of your mucus.   °· Your breathing is more labored than usual.   °· Your breathing is faster than usual.   °SEEK IMMEDIATE MEDICAL CARE IF:  °· You have shortness of breath while you are resting.   °· You have shortness of breath that prevents you from: °¨ Being able to talk.   °¨ Performing your usual physical activities.   °· You have chest pain lasting longer than 5 minutes.   °· Your skin color is more cyanotic than usual. °· You measure low oxygen saturations for longer than 5 minutes with a pulse oximeter. °MAKE SURE YOU:  °· Understand these instructions. °· Will watch your condition. °· Will get help right away if you are not doing well or get  worse. °Document Released: 05/12/2005 Document Revised: 12/17/2013 Document Reviewed: 03/29/2013 °[employer]® Patient Information ©2015 [employer]. This information is not intended to replace advice given to you by your health care provider. Make sure you discuss any questions you have with your health care provider. ° °

## 2015-04-13 NOTE — ED Provider Notes (Signed)
[facility] Emergency Department Provider Note     Time seen: ----------------------------------------- 4:38 PM on 04/13/2015 -----------------------------------------    I have reviewed the triage vital signs and the nursing notes.   HISTORY  Chief Complaint No chief complaint on file.    HPI Kristopher Powell is a 59 y.o. male who presents ER with intermittent chest pain shortness of breath for the last 24 hours. Patient states she just doesn't feel good. States his been going on for several days, he was seen in the ER for same about 2 weeks ago. Patient states it has gotten somewhat better but then the pain recurred. He was given aspirin and nitroglycerin prior to arrival he felt a slight bit better. Patient denies other complaints.   Past Medical History  Diagnosis Date  . COPD (chronic obstructive pulmonary disease)     There are no active problems to display for this patient.   No past surgical history on file.  Allergies Review of patient's allergies indicates no known allergies.  Social History Social History  Substance Use Topics  . Smoking status: Current Every Day Smoker -- 2.00 packs/day for 40 years    Types: Cigarettes  . Smokeless tobacco: Not on file  . Alcohol Use: No    Review of Systems Constitutional: Negative for fever. Eyes: Negative for visual changes. ENT: Negative for sore throat. Cardiovascular: Positive for chest pain Respiratory: Positive for shortness of breath Gastrointestinal: Negative for abdominal pain, vomiting and diarrhea. Genitourinary: Negative for dysuria. Musculoskeletal: Negative for back pain. Skin: Negative for rash. Neurological: Negative for headaches, focal weakness or numbness.  10-point ROS otherwise negative.  ____________________________________________   PHYSICAL EXAM:  VITAL SIGNS: ED Triage Vitals  Enc Vitals Group     BP --      Pulse --      Resp --      Temp --    Temp src --      SpO2 --      Weight --      Height --      Head Cir --      Peak Flow --      Pain Score --      Pain Loc --      Pain Edu? --      Excl. in GC? --     Constitutional: Alert and oriented. Well appearing and in no distress. Eyes: Conjunctivae are normal. PERRL. Normal extraocular movements. ENT   Head: Normocephalic and atraumatic.   Nose: No congestion/rhinnorhea.   Mouth/Throat: Mucous membranes are moist.   Neck: No stridor. Cardiovascular: Rapid rate, regular rhythm. Normal and symmetric distal pulses are present in all extremities. No murmurs, rubs, or gallops. Respiratory: Normal respiratory effort without tachypnea nor retractions. Breath sounds are clear and equal bilaterally. No wheezes/rales/rhonchi. Gastrointestinal: Soft and nontender. No distention. No abdominal bruits.  Musculoskeletal: Nontender with normal range of motion in all extremities. No joint effusions.  No lower extremity tenderness nor edema. Neurologic:  Normal speech and language. No gross focal neurologic deficits are appreciated. Speech is normal. No gait instability. Skin:  Skin is warm, dry and intact. No rash noted. Psychiatric: Mood and affect are normal. Speech and behavior are normal. Patient exhibits appropriate insight and judgment. ____________________________________________  EKG: Interpreted by me. Sinus tachycardia with a rate of 111 bpm, normal PR interval, normal QRS with, normal QT interval. There is nonspecific ST and T-wave changes laterally.  ____________________________________________  ED COURSE:  Pertinent labs &  imaging results that were available during my care of the patient were reviewed by me and considered in my medical decision making (see chart for details). Patient is in no acute distress, recently seen 2 weeks ago for same. We'll check cardiac labs and reevaluate. ____________________________________________    LABS (pertinent  positives/negatives)  Labs Reviewed  BASIC METABOLIC PANEL - Abnormal; Notable for the following:    Sodium 131 (*)    Potassium 3.2 (*)    Chloride 97 (*)    BUN <5 (*)    Calcium 8.5 (*)    All other components within normal limits  CBC - Abnormal; Notable for the following:    WBC 14.8 (*)    RDW 15.8 (*)    All other components within normal limits  TROPONIN I    RADIOLOGY Chest x-ray IMPRESSION: COPD changes with chronic interstitial disease in lingular subsegmental atelectasis. ____________________________________________  FINAL ASSESSMENT AND PLAN  Chest pain, COPD  Plan: Patient with labs and imaging as dictated above. Patient is in no acute distress, feeling better after DuoNeb here. Patient does not want stay in the hospital and that he states he has dogs to take care of at home. I advised it wasn't clear on the etiology of his chest pain. He is given follow-up with his cardiologist in the morning. Reports she was just seen by his cardiologist 2 weeks ago   Emily Filbert, MD   Emily Filbert, MD 04/13/15 [phone]

## 2015-04-13 NOTE — ED Notes (Signed)
Patient transported to X-ray 

## 2015-05-23 ENCOUNTER — Inpatient Hospital Stay
Admission: EM | Admit: 2015-05-23 | Discharge: 2015-05-25 | DRG: 191 | Disposition: A | Discharge status: Home / Self Care | Payer: Medicaid Other | Attending: Specialist | Admitting: Specialist

## 2015-05-23 ENCOUNTER — Emergency Department: Payer: Medicaid Other

## 2015-05-23 ENCOUNTER — Encounter: Payer: Self-pay | Admitting: Emergency Medicine

## 2015-05-23 DIAGNOSIS — I110 Hypertensive heart disease with heart failure: Secondary | ICD-10-CM | POA: Diagnosis present

## 2015-05-23 DIAGNOSIS — E870 Hyperosmolality and hypernatremia: Secondary | ICD-10-CM | POA: Diagnosis present

## 2015-05-23 DIAGNOSIS — Z955 Presence of coronary angioplasty implant and graft: Secondary | ICD-10-CM | POA: Diagnosis not present

## 2015-05-23 DIAGNOSIS — Z9049 Acquired absence of other specified parts of digestive tract: Secondary | ICD-10-CM | POA: Diagnosis not present

## 2015-05-23 DIAGNOSIS — I2510 Atherosclerotic heart disease of native coronary artery without angina pectoris: Secondary | ICD-10-CM | POA: Diagnosis present

## 2015-05-23 DIAGNOSIS — F209 Schizophrenia, unspecified: Secondary | ICD-10-CM | POA: Diagnosis present

## 2015-05-23 DIAGNOSIS — J449 Chronic obstructive pulmonary disease, unspecified: Secondary | ICD-10-CM

## 2015-05-23 DIAGNOSIS — K219 Gastro-esophageal reflux disease without esophagitis: Secondary | ICD-10-CM | POA: Diagnosis present

## 2015-05-23 DIAGNOSIS — R079 Chest pain, unspecified: Secondary | ICD-10-CM

## 2015-05-23 DIAGNOSIS — Z7952 Long term (current) use of systemic steroids: Secondary | ICD-10-CM | POA: Diagnosis not present

## 2015-05-23 DIAGNOSIS — E871 Hypo-osmolality and hyponatremia: Secondary | ICD-10-CM

## 2015-05-23 DIAGNOSIS — E119 Type 2 diabetes mellitus without complications: Secondary | ICD-10-CM | POA: Diagnosis present

## 2015-05-23 DIAGNOSIS — Z79899 Other long term (current) drug therapy: Secondary | ICD-10-CM

## 2015-05-23 DIAGNOSIS — J441 Chronic obstructive pulmonary disease with (acute) exacerbation: Secondary | ICD-10-CM | POA: Diagnosis not present

## 2015-05-23 DIAGNOSIS — I252 Old myocardial infarction: Secondary | ICD-10-CM

## 2015-05-23 DIAGNOSIS — I509 Heart failure, unspecified: Secondary | ICD-10-CM | POA: Diagnosis present

## 2015-05-23 DIAGNOSIS — F17210 Nicotine dependence, cigarettes, uncomplicated: Secondary | ICD-10-CM | POA: Diagnosis present

## 2015-05-23 DIAGNOSIS — Z9981 Dependence on supplemental oxygen: Secondary | ICD-10-CM

## 2015-05-23 DIAGNOSIS — E876 Hypokalemia: Secondary | ICD-10-CM | POA: Diagnosis present

## 2015-05-23 LAB — URINALYSIS COMPLETE WITH MICROSCOPIC (ARMC ONLY)
Bilirubin Urine: NEGATIVE
Glucose, UA: NEGATIVE mg/dL
LEUKOCYTES UA: NEGATIVE
Nitrite: NEGATIVE
PH: 6 (ref 5.0–8.0)
PROTEIN: NEGATIVE mg/dL
RBC / HPF: NONE SEEN RBC/hpf (ref 0–5)
SQUAMOUS EPITHELIAL / LPF: NONE SEEN
Specific Gravity, Urine: 1.003 — ABNORMAL LOW (ref 1.005–1.030)

## 2015-05-23 LAB — CBC
HEMATOCRIT: 37.9 % — AB (ref 40.0–52.0)
Hemoglobin: 12.7 g/dL — ABNORMAL LOW (ref 13.0–18.0)
MCH: 27.4 pg (ref 26.0–34.0)
MCHC: 33.6 g/dL (ref 32.0–36.0)
MCV: 81.7 fL (ref 80.0–100.0)
PLATELETS: 283 10*3/uL (ref 150–440)
RBC: 4.64 MIL/uL (ref 4.40–5.90)
RDW: 14.8 % — AB (ref 11.5–14.5)
WBC: 15 10*3/uL — AB (ref 3.8–10.6)

## 2015-05-23 LAB — OSMOLALITY, URINE: OSMOLALITY UR: 106 mosm/kg — AB (ref 300–900)

## 2015-05-23 LAB — TSH: TSH: 0.847 u[IU]/mL (ref 0.350–4.500)

## 2015-05-23 LAB — TROPONIN I: Troponin I: <0.03 ng/mL (ref <0.031)

## 2015-05-23 LAB — POTASSIUM, URINE RANDOM: POTASSIUM UR: 5 mmol/L

## 2015-05-23 LAB — SODIUM, URINE, RANDOM

## 2015-05-23 LAB — MAGNESIUM: MAGNESIUM: 1.5 mg/dL — AB (ref 1.7–2.4)

## 2015-05-23 MED ORDER — AMLODIPINE BESYLATE 5 MG PO TABS
5.0000 mg | ORAL_TABLET | Freq: Every day | ORAL | Status: DC
  Administered 2015-05-24 – 2015-05-25 (×2): 5 mg via ORAL
  Filled 2015-05-23 (×2): qty 1

## 2015-05-23 MED ORDER — IPRATROPIUM-ALBUTEROL 0.5-2.5 (3) MG/3ML IN SOLN
3.0000 mL | Freq: Once | RESPIRATORY_TRACT | Status: AC
  Administered 2015-05-23: 3 mL via RESPIRATORY_TRACT
  Filled 2015-05-23: qty 3

## 2015-05-23 MED ORDER — METHYLPREDNISOLONE SODIUM SUCC 125 MG IJ SOLR
125.0000 mg | Freq: Once | INTRAMUSCULAR | Status: AC
  Administered 2015-05-23: 125 mg via INTRAVENOUS
  Filled 2015-05-23: qty 2

## 2015-05-23 MED ORDER — TRAZODONE HCL 100 MG PO TABS
200.0000 mg | ORAL_TABLET | Freq: Every day | ORAL | Status: DC
  Administered 2015-05-23 – 2015-05-24 (×2): 200 mg via ORAL
  Filled 2015-05-23 (×2): qty 2

## 2015-05-23 MED ORDER — ATORVASTATIN CALCIUM 20 MG PO TABS
80.0000 mg | ORAL_TABLET | Freq: Every day | ORAL | Status: DC
  Administered 2015-05-23 – 2015-05-24 (×2): 80 mg via ORAL
  Filled 2015-05-23 (×2): qty 4

## 2015-05-23 MED ORDER — NICOTINE 21 MG/24HR TD PT24
21.0000 mg | MEDICATED_PATCH | Freq: Every day | TRANSDERMAL | Status: DC
  Administered 2015-05-23 – 2015-05-25 (×3): 21 mg via TRANSDERMAL
  Filled 2015-05-23 (×3): qty 1

## 2015-05-23 MED ORDER — POTASSIUM CHLORIDE CRYS ER 20 MEQ PO TBCR
40.0000 meq | EXTENDED_RELEASE_TABLET | Freq: Once | ORAL | Status: AC
  Administered 2015-05-23: 40 meq via ORAL
  Filled 2015-05-23: qty 2

## 2015-05-23 MED ORDER — TIOTROPIUM BROMIDE MONOHYDRATE 18 MCG IN CAPS
1.0000 | ORAL_CAPSULE | Freq: Every day | RESPIRATORY_TRACT | Status: DC
  Administered 2015-05-24 – 2015-05-25 (×2): 18 ug via RESPIRATORY_TRACT
  Filled 2015-05-23: qty 5

## 2015-05-23 MED ORDER — ENOXAPARIN SODIUM 40 MG/0.4ML ~~LOC~~ SOLN
40.0000 mg | SUBCUTANEOUS | Status: DC
  Administered 2015-05-23 – 2015-05-24 (×2): 40 mg via SUBCUTANEOUS
  Filled 2015-05-23 (×2): qty 0.4

## 2015-05-23 MED ORDER — SODIUM CHLORIDE 0.9 % IV SOLN
Freq: Once | INTRAVENOUS | Status: AC
  Administered 2015-05-23: 19:00:00 via INTRAVENOUS

## 2015-05-23 MED ORDER — GABAPENTIN 300 MG PO CAPS
900.0000 mg | ORAL_CAPSULE | ORAL | Status: DC | PRN
  Administered 2015-05-23 – 2015-05-24 (×2): 1200 mg via ORAL
  Filled 2015-05-23 (×2): qty 4

## 2015-05-23 MED ORDER — METOPROLOL SUCCINATE ER 25 MG PO TB24
25.0000 mg | ORAL_TABLET | Freq: Every day | ORAL | Status: DC
  Administered 2015-05-24 – 2015-05-25 (×2): 25 mg via ORAL
  Filled 2015-05-23 (×2): qty 1

## 2015-05-23 MED ORDER — POTASSIUM CHLORIDE CRYS ER 20 MEQ PO TBCR
40.0000 meq | EXTENDED_RELEASE_TABLET | Freq: Two times a day (BID) | ORAL | Status: DC
  Administered 2015-05-24 – 2015-05-25 (×3): 40 meq via ORAL
  Filled 2015-05-23 (×3): qty 2

## 2015-05-23 MED ORDER — RISPERIDONE 2 MG PO TABS
2.0000 mg | ORAL_TABLET | Freq: Every day | ORAL | Status: DC
  Administered 2015-05-23 – 2015-05-24 (×2): 2 mg via ORAL
  Filled 2015-05-23 (×4): qty 1

## 2015-05-23 MED ORDER — DIAZEPAM 5 MG PO TABS
10.0000 mg | ORAL_TABLET | Freq: Three times a day (TID) | ORAL | Status: DC | PRN
  Administered 2015-05-24: 10 mg via ORAL
  Filled 2015-05-23: qty 2

## 2015-05-23 MED ORDER — PANTOPRAZOLE SODIUM 40 MG PO TBEC
40.0000 mg | DELAYED_RELEASE_TABLET | Freq: Every day | ORAL | Status: DC
  Administered 2015-05-24 – 2015-05-25 (×2): 40 mg via ORAL
  Filled 2015-05-23 (×2): qty 1

## 2015-05-23 MED ORDER — ASPIRIN EC 325 MG PO TBEC
325.0000 mg | DELAYED_RELEASE_TABLET | Freq: Every day | ORAL | Status: DC
  Administered 2015-05-24 – 2015-05-25 (×2): 325 mg via ORAL
  Filled 2015-05-23 (×2): qty 1

## 2015-05-23 MED ORDER — SODIUM CHLORIDE 0.9 % IV SOLN
INTRAVENOUS | Status: DC
  Administered 2015-05-23: 23:00:00 via INTRAVENOUS

## 2015-05-23 MED ORDER — METFORMIN HCL ER 500 MG PO TB24
500.0000 mg | ORAL_TABLET | Freq: Two times a day (BID) | ORAL | Status: DC
  Administered 2015-05-24 – 2015-05-25 (×3): 500 mg via ORAL
  Filled 2015-05-23 (×3): qty 1

## 2015-05-23 NOTE — ED Provider Notes (Signed)
[facility] Emergency Department Provider Note     Time seen: ----------------------------------------- 5:36 PM on 05/23/2015 -----------------------------------------    I have reviewed the triage vital signs and the nursing notes.   HISTORY  Chief Complaint No chief complaint on file.    HPI Kristopher Powell is a 59 y.o. male well-known to the ER presents with chest pain shortness of breath. Patientcomplains of soreness in the left side of his chest, nothing makes it better or worse. He is also short of breath, currently smokes 2 packs a day.   Past Medical History  Diagnosis Date  . COPD (chronic obstructive pulmonary disease)   . Diabetes mellitus without complication   . CHF (congestive heart failure)   . Hypertension   . MI (myocardial infarction) 2006    There are no active problems to display for this patient.   Past Surgical History  Procedure Laterality Date  . Coronary angioplasty with stent placement    . Appendectomy    . Cholecystectomy      Allergies Review of patient's allergies indicates no known allergies.  Social History Social History  Substance Use Topics  . Smoking status: Current Every Day Smoker -- 2.00 packs/day for 40 years    Types: Cigarettes  . Smokeless tobacco: Not on file  . Alcohol Use: No    Review of Systems Constitutional: Negative for fever. Eyes: Negative for visual changes. ENT: Negative for sore throat. Cardiovascular: Positive for chest pain Respiratory: Positive for shortness of breath Gastrointestinal: Negative for abdominal pain, vomiting and diarrhea. Genitourinary: Negative for dysuria. Musculoskeletal: Negative for back pain. Skin: Negative for rash. Neurological: Negative for headaches, focal weakness or numbness.  10-point ROS otherwise negative.  ____________________________________________   PHYSICAL EXAM:  VITAL SIGNS: ED Triage Vitals  Enc Vitals Group     BP --      Pulse --      Resp --      Temp --      Temp src --      SpO2 --      Weight --      Height --      Head Cir --      Peak Flow --      Pain Score --      Pain Loc --      Pain Edu? --      Excl. in GC? --     Constitutional: Alert and oriented. Well appearing and in no distress. Eyes: Conjunctivae are normal. PERRL. Normal extraocular movements. ENT   Head: Normocephalic and atraumatic.   Nose: No congestion/rhinnorhea.   Mouth/Throat: Mucous membranes are moist.   Neck: No stridor. Cardiovascular: Normal rate, regular rhythm. Normal and symmetric distal pulses are present in all extremities. No murmurs, rubs, or gallops. Respiratory: Patient is not tachypnea, decreased breath sounds bilaterally, scattered rhonchi. Gastrointestinal: Soft and nontender. No distention. No abdominal bruits.  Musculoskeletal: Nontender with normal range of motion in all extremities. No joint effusions.  No lower extremity tenderness nor edema. Neurologic:  Normal speech and language. No gross focal neurologic deficits are appreciated. Speech is normal. No gait instability. Skin:  Skin is warm, dry and intact. No rash noted. Psychiatric: Mood and affect are normal. Speech and behavior are normal. Patient exhibits appropriate insight and judgment. ____________________________________________  EKG: Interpreted by me. Normal sinus rhythm rate of 97 bpm, normal PR interval, normal QRS width. Nonspecific ST and T-wave changes. Long QT.  ____________________________________________  ED COURSE:  Pertinent labs & imaging results that were available during my care of the patient were reviewed by me and considered in my medical decision making (see chart for details). Patient well-known to the ER, we'll cardiac labs obtained chest x-ray given DuoNeb and reevaluate. ____________________________________________    LABS (pertinent positives/negatives)  Labs Reviewed  CBC - Abnormal; Notable  for the following:    WBC 15.0 (*)    Hemoglobin 12.7 (*)    HCT 37.9 (*)    RDW 14.8 (*)    All other components within normal limits  COMPREHENSIVE METABOLIC PANEL - Abnormal; Notable for the following:    Sodium 118 (*)    Potassium 2.9 (*)    Chloride 85 (*)    Calcium 8.0 (*)    Total Protein 6.3 (*)    ALT 16 (*)    Total Bilirubin 1.3 (*)    All other components within normal limits  TROPONIN I    RADIOLOGY Images were viewed by me  Chest x-ray IMPRESSION: No active cardiopulmonary disease. ____________________________________________  FINAL ASSESSMENT AND PLAN  Chest pain, COPD, hyponatremia  Plan: Patient with labs and imaging as dictated above. Patient with chronic chest pain, also found to be hyponatremic here with a sodium of 118. He is started on saline, and need admission to the hospital to ensure improvement. He also received a DuoNeb and steroids for his COPD.   Emily Filbert, MD   Emily Filbert, MD 05/23/15 [phone]

## 2015-05-23 NOTE — H&P (Signed)
[facility] - [place] at [facility]   PATIENT NAME: Kristopher Powell    MR#:  [id number]  DATE OF BIRTH:  [birth year]  DATE OF ADMISSION:  05/23/2015  PRIMARY CARE PHYSICIAN: Dr.Baldwin   REQUESTING/REFERRING PHYSICIAN: Mayford Knife  CHIEF COMPLAINT:   Chief Complaint  Patient presents with  . Chest Pain    HISTORY OF PRESENT ILLNESS: Kristopher Powell  is a 59 y.o. male with a known history of COPD, diabetes, CHF, hypertension, coronary artery disease- came to emergency room with complaint of chest pain which is central pressure-like 7-8 out of 10 and constant. He denies any cough or sputum production but he had some chills and difficulty urination for last few days. He also felt having nausea but did not throw up any. He had multiple ER visits in past with chest pain on and off in past. Today in ER he was noted to have low sodium level and potassium level and so given his admission to hospitalist team.  PAST MEDICAL HISTORY:   Past Medical History  Diagnosis Date  . COPD (chronic obstructive pulmonary disease) (HCC)   . Diabetes mellitus without complication (HCC)   . CHF (congestive heart failure) (HCC)   . Hypertension   . MI (myocardial infarction) (HCC) 2006    PAST SURGICAL HISTORY:  Past Surgical History  Procedure Laterality Date  . Coronary angioplasty with stent placement    . Appendectomy    . Cholecystectomy      SOCIAL HISTORY:  Social History  Substance Use Topics  . Smoking status: Current Every Day Smoker -- 2.00 packs/day for 40 years    Types: Cigarettes  . Smokeless tobacco: Not on file  . Alcohol Use: No    FAMILY HISTORY:  Family History  Problem Relation Age of Onset  . CAD Mother   . Stroke Father     DRUG ALLERGIES: No Known Allergies  REVIEW OF SYSTEMS:   CONSTITUTIONAL: No fever, fatigue or weakness.  EYES: No blurred or double vision.  EARS, NOSE, AND THROAT: No tinnitus or ear pain.  RESPIRATORY: No cough,  shortness of breath, wheezing or hemoptysis.  CARDIOVASCULAR: No chest pain, orthopnea, edema.  GASTROINTESTINAL: positive for nausea, no vomiting, diarrhea or abdominal pain.  GENITOURINARY: No dysuria, hematuria. Have difficulty urination for last few days. ENDOCRINE: No polyuria, nocturia,  HEMATOLOGY: No anemia, easy bruising or bleeding SKIN: No rash or lesion. MUSCULOSKELETAL: No joint pain or arthritis.   NEUROLOGIC: No tingling, numbness, weakness.  PSYCHIATRY: No anxiety or depression.   MEDICATIONS AT HOME:  Prior to Admission medications   Medication Sig Start Date End Date Taking? Authorizing Provider  amLODipine (NORVASC) 5 MG tablet Take 5 mg by mouth daily.   Yes Historical Provider, MD  aspirin EC 325 MG tablet Take 325 mg by mouth daily.   Yes Historical Provider, MD  atorvastatin (LIPITOR) 80 MG tablet Take 80 mg by mouth at bedtime.    Yes Historical Provider, MD  diazepam (VALIUM) 10 MG tablet Take 10 mg by mouth 3 (three) times daily as needed for anxiety.    Yes Historical Provider, MD  esomeprazole (NEXIUM) 40 MG capsule Take 40 mg by mouth daily.   Yes Historical Provider, MD  furosemide (LASIX) 20 MG tablet Take 10 mg by mouth daily.   Yes Historical Provider, MD  gabapentin (NEURONTIN) 300 MG capsule Take 900-1,200 mg by mouth every 4 (four) hours as needed (for pain).    Yes Historical Provider, MD  metFORMIN (  GLUCOPHAGE-XR) 500 MG 24 hr tablet Take 1 tablet by mouth 2 (two) times daily.   Yes Historical Provider, MD  metoprolol succinate (TOPROL XL) 25 MG 24 hr tablet Take 25 mg by mouth daily.   Yes Historical Provider, MD  risperiDONE (RISPERDAL) 2 MG tablet Take 2 mg by mouth at bedtime.   Yes Historical Provider, MD  tiotropium (SPIRIVA HANDIHALER) 18 MCG inhalation capsule Place 1 capsule into inhaler and inhale daily.   Yes Historical Provider, MD  traZODone (DESYREL) 100 MG tablet Take 200 mg by mouth at bedtime.   Yes Historical Provider, MD       PHYSICAL EXAMINATION:   VITAL SIGNS: Blood pressure 130/80, pulse 80, temperature 97.8 F (36.6 C), temperature source Oral, resp. rate 20, height 6' (1.829 m), weight 99.791 kg (220 lb), SpO2 99 %.  GENERAL:  59 y.o.-year-old patient lying in the bed with no acute distress. Over all poor hyegiene and bad smell in room. EYES: Pupils equal, round, reactive to light and accommodation. No scleral icterus. Extraocular muscles intact.  HEENT: Head atraumatic, normocephalic. Oropharynx and nasopharynx clear.  NECK:  Supple, no jugular venous distention. No thyroid enlargement, no tenderness.  LUNGS: Normal breath sounds bilaterally, no wheezing, rales,rhonchi or crepitation. No use of accessory muscles of respiration.  CARDIOVASCULAR: S1, S2 normal. No murmurs, rubs, or gallops.  ABDOMEN: Soft, nontender, nondistended. Bowel sounds present. No organomegaly or mass. No supra pubic or CV Angle tenderness. EXTREMITIES: No pedal edema, cyanosis, or clubbing.  NEUROLOGIC: Cranial nerves II through XII are intact. Muscle strength 5/5 in all extremities. Sensation intact. Gait not checked.  PSYCHIATRIC: The patient is alert and oriented x 3.  SKIN: No obvious rash, lesion, or ulcer.   LABORATORY PANEL:   CBC  Recent Labs Lab 05/23/15 1743  WBC 15.0*  HGB 12.7*  HCT 37.9*  PLT 283  MCV 81.7  MCH 27.4  MCHC 33.6  RDW 14.8*   ------------------------------------------------------------------------------------------------------------------  Chemistries   Recent Labs Lab 05/23/15 1743  NA 118*  K 2.9*  CL 85*  CO2 23  GLUCOSE 82  BUN 12  CREATININE 0.99  CALCIUM 8.0*  AST 21  ALT 16*  ALKPHOS 108  BILITOT 1.3*   ------------------------------------------------------------------------------------------------------------------ estimated creatinine clearance is 98.3 mL/min (by C-G formula based on Cr of  0.99). ------------------------------------------------------------------------------------------------------------------ No results for input(s): TSH, T4TOTAL, T3FREE, THYROIDAB in the last 72 hours.  Invalid input(s): FREET3   Coagulation profile No results for input(s): INR, PROTIME in the last 168 hours. ------------------------------------------------------------------------------------------------------------------- No results for input(s): DDIMER in the last 72 hours. -------------------------------------------------------------------------------------------------------------------  Cardiac Enzymes  Recent Labs Lab 05/23/15 1743  TROPONINI <0.03   ------------------------------------------------------------------------------------------------------------------ Invalid input(s): POCBNP  ---------------------------------------------------------------------------------------------------------------  Urinalysis    Component Value Date/Time   COLORURINE Colorless 05/09/2014 2046   APPEARANCEUR Clear 05/09/2014 2046   LABSPEC 1.001 05/09/2014 2046   PHURINE 7.0 05/09/2014 2046   GLUCOSEU Negative 05/09/2014 2046   HGBUR Negative 05/09/2014 2046   BILIRUBINUR Negative 05/09/2014 2046   KETONESUR Negative 05/09/2014 2046   PROTEINUR Negative 05/09/2014 2046   NITRITE Negative 05/09/2014 2046   LEUKOCYTESUR Negative 05/09/2014 2046     RADIOLOGY: Dg Chest 2 View  05/23/2015   CLINICAL DATA:  Chest pain.  EXAM: CHEST  2 VIEW  COMPARISON:  April 13, 2015.  FINDINGS: The heart size and mediastinal contours are within normal limits. No pneumothorax or pleural effusion is noted. Right lung is clear. Stable scarring is noted in left lung base. Old left  rib fractures are again noted.  IMPRESSION: No active cardiopulmonary disease.   Electronically Signed   By: Lupita Raider, M.D.   On: 05/23/2015 18:22    IMPRESSION AND PLAN:  * Hyponatremia and hypokalemia  I suspect this  is secondary to possible UTI, as his symptoms of dysuria.  Elevated white cell count, urinalysis is not checked by ER so I sent urine sample and urine culture.  If it comes out positive we need to start him on some antibiotics.  Meanwhile will check urine sodium and urine potassium level and osmolality, and replace electrolytes with IV fluid and oral.  Also check magnesium level and TSH level.  Depending on recovery of his sodium level we may have to call nephrology consult.  * Chest pain His first troponin is negative, and he had multiple visits in ER in past without significant findings.  I will monitor on cardiac monitor and follow serial troponin.  * History of CHF ejection fraction 45% as per previous records  As with hypo-kalemia and hyponatremia is not a candidate with Lasix at this time and he is not in exacerbation symptoms also. So we will hold Lasix.  * Hypertension  Continue home medication of metoprolol, amlodipine.  * History of COPD  No exacerbation symptoms, continue Spiriva and nebulizer patient treatments.  * Smoking  Counseled to quit smoking for 4 minutes and offered nicotine patch.  All the records are reviewed and case discussed with ED provider. Management plans discussed with the patient, family and they are in agreement.  CODE STATUS: full.    TOTAL TIME TAKING CARE OF THIS PATIENT: 50 minutes.    Altamese Dilling M.D on 05/23/2015   Between 7am to 6pm - Pager - [phone]  After 6pm go to [email or web address] - password [email or web address] [facility]  [place] [facility]  Office  [phone]  CC: Primary care physician; No PCP Per Patient   Note: This dictation was prepared with Dragon dictation along with smaller phrase technology. Any transcriptional errors that result from this process are unintentional.

## 2015-05-23 NOTE — ED Notes (Addendum)
Patient was changed into hospital gown with yellow socks. Patient was cooperative with change. Patient has required assistance several times to call family members, able to get in touch with one who hung up on him. Patient is concerned about contacting family.

## 2015-05-23 NOTE — Plan of Care (Signed)
Problem: Discharge Progression Outcomes Goal: Other Discharge Outcomes/Goals Outcome: Progressing Plan of care progress to goals: Discharge plan- pt will d/c to home at time of discharge.  Pain controlled- pt c/o right arm pain, requesting gabapentin, improvement noted. Hemodynamically stable- VSS, NA 118, potassium 2.8 PO supplement given in ED per report, 1st trop 0.03, continue to monitor labs. NSR on off unit telemetry box MX [id number].  Diet- tolerating, no swallowing difficulty. No c/o nausea or vomiting.  Activity appropriate for discharge- pt gait unsteady. Pt states that he uses assistance from cane, walker and sometimes wheelchair. Pt needs assistance when OOB and while up to use urinal.

## 2015-05-23 NOTE — ED Notes (Signed)
Patient ambulated to in room toilet and was incontinent. Patient refused to change his clothes at this time according to [facility].

## 2015-05-24 LAB — CBC
HEMATOCRIT: 39.5 % — AB (ref 40.0–52.0)
Hemoglobin: 13.6 g/dL (ref 13.0–18.0)
MCH: 28 pg (ref 26.0–34.0)
MCHC: 34.4 g/dL (ref 32.0–36.0)
MCV: 81.3 fL (ref 80.0–100.0)
PLATELETS: 290 10*3/uL (ref 150–440)
RBC: 4.87 MIL/uL (ref 4.40–5.90)
RDW: 14.7 % — AB (ref 11.5–14.5)
WBC: 6.7 10*3/uL (ref 3.8–10.6)

## 2015-05-24 LAB — BASIC METABOLIC PANEL
Anion gap: 5 (ref 5–15)
BUN: 6 mg/dL (ref 6–20)
CO2: 26 mmol/L (ref 22–32)
CREATININE: 0.68 mg/dL (ref 0.61–1.24)
Calcium: 8.6 mg/dL — ABNORMAL LOW (ref 8.9–10.3)
Chloride: 98 mmol/L — ABNORMAL LOW (ref 101–111)
GFR calc Af Amer: >60 mL/min (ref >60)
GLUCOSE: 181 mg/dL — AB (ref 65–99)
POTASSIUM: 3.7 mmol/L (ref 3.5–5.1)
SODIUM: 129 mmol/L — AB (ref 135–145)

## 2015-05-24 LAB — TROPONIN I: Troponin I: <0.03 ng/mL (ref <0.031)

## 2015-05-24 LAB — GLUCOSE, CAPILLARY: Glucose-Capillary: 253 mg/dL — ABNORMAL HIGH (ref 65–99)

## 2015-05-24 MED ORDER — ALBUTEROL SULFATE (2.5 MG/3ML) 0.083% IN NEBU
2.5000 mg | INHALATION_SOLUTION | RESPIRATORY_TRACT | Status: DC | PRN
  Administered 2015-05-24: 2.5 mg via RESPIRATORY_TRACT
  Filled 2015-05-24: qty 3

## 2015-05-24 MED ORDER — ALBUTEROL SULFATE HFA 108 (90 BASE) MCG/ACT IN AERS: 2.0000 | INHALATION_SPRAY | RESPIRATORY_TRACT | Status: DC | PRN

## 2015-05-24 NOTE — Progress Notes (Signed)
[facility] - [place] at [facility]   PATIENT NAME: Kristopher Powell    MR#:  [id number]  DATE OF BIRTH:  January 15, 1956  SUBJECTIVE:  CHIEF COMPLAINT:   Chief Complaint  Patient presents with  . Chest Pain   patient here and shortness of breath and chest pain secondary to COPD exacerbation. Shortness of breath but improved. Also noted to be hyponatremic which has also improved.  REVIEW OF SYSTEMS:    Review of Systems  Constitutional: Negative for fever and chills.  HENT: Negative for congestion and tinnitus.   Eyes: Negative for blurred vision and double vision.  Respiratory: Positive for shortness of breath. Negative for cough and wheezing (improved).   Cardiovascular: Negative for chest pain, orthopnea and PND.  Gastrointestinal: Negative for nausea, vomiting, abdominal pain and diarrhea.  Genitourinary: Negative for dysuria and hematuria.  Neurological: Negative for dizziness, sensory change and focal weakness.  All other systems reviewed and are negative.   Nutrition: Carb modified Tolerating Diet: Yes Tolerating PT: Await evaluation   DRUG ALLERGIES:  No Known Allergies  VITALS:  Blood pressure 148/90, pulse 86, temperature 97.7 F (36.5 C), temperature source Oral, resp. rate 18, height 6' (1.829 m), weight 93.577 kg (206 lb 4.8 oz), SpO2 96 %.  PHYSICAL EXAMINATION:   Physical Exam  GENERAL:  59 y.o.-year-old patient lying in the bed with no acute distress.  EYES: Pupils equal, round, reactive to light and accommodation. No scleral icterus. Extraocular muscles intact.  HEENT: Head atraumatic, normocephalic. Oropharynx and nasopharynx clear.  NECK:  Supple, no jugular venous distention. No thyroid enlargement, no tenderness.  LUNGS: Prolonged inspiratory and expiratory phase, no wheezing, rales, rhonchi. No use of accessory muscles of respiration.  CARDIOVASCULAR: S1, S2 normal. No murmurs, rubs, or gallops.  ABDOMEN: Soft, nontender,  nondistended. Bowel sounds present. No organomegaly or mass.  EXTREMITIES: No cyanosis, clubbing or edema b/l.    NEUROLOGIC: Cranial nerves II through XII are intact. No focal Motor or sensory deficits b/l.   PSYCHIATRIC: The patient is alert and oriented x 3. Good affect SKIN: No obvious rash, lesion, or ulcer.    LABORATORY PANEL:   CBC  Recent Labs Lab 05/24/15 0456  WBC 6.7  HGB 13.6  HCT 39.5*  PLT 290   ------------------------------------------------------------------------------------------------------------------  Chemistries   Recent Labs Lab 05/22/15 2000  05/23/15 1743 05/24/15 0456  NA  --   < > 118* 129*  K  --   < > 2.9* 3.7  CL  --   < > 85* 98*  CO2  --   < > 23 26  GLUCOSE  --   < > 82 181*  BUN  --   < > 12 6  CREATININE  --   < > 0.99 0.68  CALCIUM  --   < > 8.0* 8.6*  MG 1.5*  --   --   --   AST  --   --  21  --   ALT  --   --  16*  --   ALKPHOS  --   --  108  --   BILITOT  --   --  1.3*  --   < > = values in this interval not displayed. ------------------------------------------------------------------------------------------------------------------  Cardiac Enzymes  Recent Labs Lab 05/24/15 0455  TROPONINI <0.03   ------------------------------------------------------------------------------------------------------------------  RADIOLOGY:  Dg Chest 2 View  05/23/2015   CLINICAL DATA:  Chest pain.  EXAM: CHEST  2 VIEW  COMPARISON:  April 13, 2015.  FINDINGS: The heart size and mediastinal contours are within normal limits. No pneumothorax or pleural effusion is noted. Right lung is clear. Stable scarring is noted in left lung base. Old left rib fractures are again noted.  IMPRESSION: No active cardiopulmonary disease.   Electronically Signed   By: Lupita Raider, M.D.   On: 05/23/2015 18:22     ASSESSMENT AND PLAN:   59 year old male with past medical history of diabetes, hypertension, hyperlipidemia, history of schizophrenia,  COPD, history of CHF who presented to the hospital due to shortness of breath and also noted to be in mild COPD exacerbation. Patient also noted to be hyponatremic and hypomagnesemic.  #1 COPD exacerbation-clinically much improved since yesterday.  -Improved with IV steroids, continue duo nebs, Spiriva, albuterol inhaler as needed. -Patient already on oxygen at home.  #2 hyponatremia-hyperosmolar in nature. -Improved with some IV fluids. Urine osmolality is low and urine sodium less than 10. Patient is on diuretics. Likely a combination of volume loss and also maybe underlying SIADH. -Sodium improving and will monitor.  #3 hypomagnesemia-supplemented and has improved.  #4 history of schizophrenia-continue Risperdal.  #5 hypertension-continue Norvasc, Toprol.  #6 GERD-continue Protonix.  #7 tobacco abuse-continue nicotine patch.   All the records are reviewed and case discussed with Care Management/Social Workerr. Management plans discussed with the patient, family and they are in agreement.  CODE STATUS: Full  DVT Prophylaxis: Lovenox  TOTAL TIME TAKING CARE OF THIS PATIENT: 25 minutes.   POSSIBLE D/C IN 1-2 DAYS, DEPENDING ON CLINICAL CONDITION.   Houston Siren M.D on 05/24/2015 at 1:13 PM  Between 7am to 6pm - Pager - [phone]  After 6pm go to [email or web address] - password [email or web address] [facility]  [place] [facility]  Office  [phone]  CC: Primary care physician; No PCP Per Patient

## 2015-05-24 NOTE — Plan of Care (Signed)
Problem: Discharge Progression Outcomes Goal: Other Discharge Outcomes/Goals Outcome: Progressing VSS. Continues on 3L O2 per [place] as at home with O2 sats in the mid 90's. Pt denies pain. Valium given once for anxiety with improvement. Telemetry d/c. Pt is non compliant with diet.

## 2015-05-25 LAB — BASIC METABOLIC PANEL
ANION GAP: 7 (ref 5–15)
BUN: 6 mg/dL (ref 6–20)
CALCIUM: 8.4 mg/dL — AB (ref 8.9–10.3)
CO2: 27 mmol/L (ref 22–32)
Chloride: 106 mmol/L (ref 101–111)
Creatinine, Ser: 0.69 mg/dL (ref 0.61–1.24)
GFR calc Af Amer: >60 mL/min (ref >60)
GFR calc non Af Amer: >60 mL/min (ref >60)
GLUCOSE: 99 mg/dL (ref 65–99)
Potassium: 4.3 mmol/L (ref 3.5–5.1)
Sodium: 140 mmol/L (ref 135–145)

## 2015-05-25 LAB — URINE CULTURE

## 2015-05-25 LAB — GLUCOSE, CAPILLARY: Glucose-Capillary: 124 mg/dL — ABNORMAL HIGH (ref 65–99)

## 2015-05-25 LAB — MAGNESIUM: Magnesium: 1.9 mg/dL (ref 1.7–2.4)

## 2015-05-25 MED ORDER — PREDNISONE 10 MG PO TABS: ORAL_TABLET | ORAL | Status: DC

## 2015-05-25 MED ORDER — AZITHROMYCIN 250 MG PO TABS: 250.0000 mg | ORAL_TABLET | Freq: Every day | ORAL | Status: AC

## 2015-05-25 NOTE — Plan of Care (Signed)
Problem: Discharge Progression Outcomes Goal: Other Discharge Outcomes/Goals Outcome: Progressing Plan of care progress to goals: Discharge plan- pt is from home and would like to discharge back to home. Pain- pt c/o right arm neuropathy pain, improved with PRN Gabapentin.  Hemodynamically stable- socium on admission was 118 improving currently 129, afebrile, VSS.  Activity appropriate for discharge- pt up ambulating BR with assistance, unsteady gait, appears to be patients baseline.

## 2015-05-25 NOTE — Discharge Summary (Signed)
[facility] -  at [facility]   PATIENT NAME: Kristopher Powell    MR#:  [id number]  DATE OF BIRTH:  1956/04/14  DATE OF ADMISSION:  05/23/2015 ADMITTING PHYSICIAN: Altamese Dilling, MD  DATE OF DISCHARGE: 05/25/2015 11:37 AM  PRIMARY CARE PHYSICIAN: No PCP Per Patient    ADMISSION DIAGNOSIS:  Hyponatremia [E87.1] Chest pain, unspecified chest pain type [R07.9] Chronic obstructive pulmonary disease, unspecified COPD type (HCC) [J44.9]  DISCHARGE DIAGNOSIS:  Principal Problem:   Hyponatremia Active Problems:   Hypokalemia   SECONDARY DIAGNOSIS:   Past Medical History  Diagnosis Date  . COPD (chronic obstructive pulmonary disease) (HCC)   . Diabetes mellitus without complication (HCC)   . CHF (congestive heart failure) (HCC)   . Hypertension   . MI (myocardial infarction) [facility]) 2006    HOSPITAL COURSE:   59 year old male with past medical history of diabetes, hypertension, hyperlipidemia, history of schizophrenia, COPD, history of CHF who presented to the hospital due to shortness of breath and also noted to be in mild COPD exacerbation. Patient also noted to be hyponatremic and hypomagnesemic.  #1 COPD exacerbation-likely the cause of patient's shortness of breath on admission. Patient received some IV steroids on the hospital. He has no further bronchospasm and wheezing. -He is being discharged on oral prednisone taper and Zithromax. He will continue his Spiriva and albuterol inhaler needed. He was strongly advised to quit smoking.  -Patient already on oxygen at home.  #2 hyponatremia-hypoosmolar in nature. -Improved and normalized with IV fluids. Clinically asymptomatic and therefore being discharged home.  #3 hypomagnesemia-supplemented and resolved.  #4 history of schizophrenia-patient will continue Risperdal.  #5 hypertension- Pt. Will continue Norvasc, Toprol.  #6 GERD-pt. Will continue his Nexium  DISCHARGE CONDITIONS:    Stable  CONSULTS OBTAINED:     DRUG ALLERGIES:  No Known Allergies  DISCHARGE MEDICATIONS:   Discharge Medication List as of 05/25/2015 10:50 AM    START taking these medications   Details  azithromycin (ZITHROMAX) 250 MG tablet Take 1 tablet (250 mg total) by mouth daily., Starting 05/25/2015, Until Fri 05/30/15, Print    predniSONE (DELTASONE) 10 MG tablet Label  & dispense according to the schedule below. 5 Pills PO for 1 day then, 4 Pills PO for 1 day, 3 Pills PO for 1 day, 2 Pills PO for 1 day, 1 Pill PO for 1 days then STOP., Print      CONTINUE these medications which have NOT CHANGED   Details  amLODipine (NORVASC) 5 MG tablet Take 5 mg by mouth daily., Until Discontinued, Historical Med    aspirin EC 325 MG tablet Take 325 mg by mouth daily., Until Discontinued, Historical Med    atorvastatin (LIPITOR) 80 MG tablet Take 80 mg by mouth at bedtime. , Until Discontinued, Historical Med    diazepam (VALIUM) 10 MG tablet Take 10 mg by mouth 3 (three) times daily as needed for anxiety. , Until Discontinued, Historical Med    esomeprazole (NEXIUM) 40 MG capsule Take 40 mg by mouth daily., Until Discontinued, Historical Med    furosemide (LASIX) 20 MG tablet Take 10 mg by mouth daily., Until Discontinued, Historical Med    gabapentin (NEURONTIN) 300 MG capsule Take 900-1,200 mg by mouth every 4 (four) hours as needed (for pain). , Until Discontinued, Historical Med    metFORMIN (GLUCOPHAGE-XR) 500 MG 24 hr tablet Take 1 tablet by mouth 2 (two) times daily., Until Discontinued, Historical Med    metoprolol succinate (TOPROL XL) 25  MG 24 hr tablet Take 25 mg by mouth daily., Until Discontinued, Historical Med    risperiDONE (RISPERDAL) 2 MG tablet Take 2 mg by mouth at bedtime., Until Discontinued, Historical Med    tiotropium (SPIRIVA HANDIHALER) 18 MCG inhalation capsule Place 1 capsule into inhaler and inhale daily., Until Discontinued, Historical Med    traZODone  (DESYREL) 100 MG tablet Take 200 mg by mouth at bedtime., Until Discontinued, Historical Med         DISCHARGE INSTRUCTIONS:   DIET:  Cardiac diet  DISCHARGE CONDITION:  Stable  ACTIVITY:  Activity as tolerated  OXYGEN:  Home Oxygen: No.   Oxygen Delivery: room air  DISCHARGE LOCATION:  home   If you experience worsening of your admission symptoms, develop shortness of breath, life threatening emergency, suicidal or homicidal thoughts you must seek medical attention immediately by calling 911 or calling your MD immediately  if symptoms less severe.  You Must read complete instructions/literature along with all the possible adverse reactions/side effects for all the Medicines you take and that have been prescribed to you. Take any new Medicines after you have completely understood and accpet all the possible adverse reactions/side effects.   Please note  You were cared for by a hospitalist during your hospital stay. If you have any questions about your discharge medications or the care you received while you were in the hospital after you are discharged, you can call the unit and asked to speak with the hospitalist on call if the hospitalist that took care of you is not available. Once you are discharged, your primary care physician will handle any further medical issues. Please note that NO REFILLS for any discharge medications will be authorized once you are discharged, as it is imperative that you return to your primary care physician (or establish a relationship with a primary care physician if you do not have one) for your aftercare needs so that they can reassess your need for medications and monitor your lab values.     Today   Shortness of breath, bronchospasm improved. No other complaints presently.  VITAL SIGNS:  Blood pressure 144/96, pulse 95, temperature 97.9 F (36.6 C), temperature source Oral, resp. rate 18, height 6' (1.829 m), weight 93.577 kg (206 lb 4.8  oz), SpO2 97 %.  I/O:   Intake/Output Summary (Last 24 hours) at 05/25/15 1316 Last data filed at 05/25/15 1052  Gross per 24 hour  Intake    240 ml  Output   3100 ml  Net  -2860 ml    PHYSICAL EXAMINATION:  GENERAL:  59 y.o.-year-old patient lying in the bed with no acute distress.  EYES: Pupils equal, round, reactive to light and accommodation. No scleral icterus. Extraocular muscles intact.  HEENT: Head atraumatic, normocephalic. Oropharynx and nasopharynx clear.  NECK:  Supple, no jugular venous distention. No thyroid enlargement, no tenderness.  LUNGS: Normal breath sounds bilaterally, no wheezing, rales,rhonchi. No use of accessory muscles of respiration.  CARDIOVASCULAR: S1, S2 normal. No murmurs, rubs, or gallops.  ABDOMEN: Soft, non-tender, non-distended. Bowel sounds present. No organomegaly or mass.  EXTREMITIES: No pedal edema, cyanosis, or clubbing.  NEUROLOGIC: Cranial nerves II through XII are intact. No focal motor or sensory defecits b/l.  PSYCHIATRIC: The patient is alert and oriented x 3. Good affect.  SKIN: No obvious rash, lesion, or ulcer.   DATA REVIEW:   CBC  Recent Labs Lab 05/24/15 0456  WBC 6.7  HGB 13.6  HCT 39.5*  PLT 290  Chemistries   Recent Labs Lab 05/23/15 1743  05/25/15 0458  NA 118*  < > 140  K 2.9*  < > 4.3  CL 85*  < > 106  CO2 23  < > 27  GLUCOSE 82  < > 99  BUN 12  < > 6  CREATININE 0.99  < > 0.69  CALCIUM 8.0*  < > 8.4*  MG  --   --  1.9  AST 21  --   --   ALT 16*  --   --   ALKPHOS 108  --   --   BILITOT 1.3*  --   --   < > = values in this interval not displayed.  Cardiac Enzymes  Recent Labs Lab 05/24/15 0455  TROPONINI <0.03    Microbiology Results  Results for orders placed or performed during the hospital encounter of 05/23/15  Urine culture     Status: None   Collection Time: 05/23/15  8:05 PM  Result Value Ref Range Status   Specimen Description URINE, RANDOM  Final   Special Requests NONE   Final   Culture MULTIPLE SPECIES PRESENT, SUGGEST RECOLLECTION  Final   Report Status 05/25/2015 FINAL  Final    RADIOLOGY:  Dg Chest 2 View  05/23/2015   CLINICAL DATA:  Chest pain.  EXAM: CHEST  2 VIEW  COMPARISON:  April 13, 2015.  FINDINGS: The heart size and mediastinal contours are within normal limits. No pneumothorax or pleural effusion is noted. Right lung is clear. Stable scarring is noted in left lung base. Old left rib fractures are again noted.  IMPRESSION: No active cardiopulmonary disease.   Electronically Signed   By: Lupita Raider, M.D.   On: 05/23/2015 18:22      Management plans discussed with the patient, family and they are in agreement.  CODE STATUS:     Code Status Orders        Start     Ordered   05/23/15 2214  Full code   Continuous     05/23/15 2214      TOTAL TIME TAKING CARE OF THIS PATIENT: 40 minutes.    Houston Siren M.D on 05/25/2015 at 1:16 PM  Between 7am to 6pm - Pager - [phone]  After 6pm go to [email or web address] - password [email or web address] [facility]  [place] [facility]  Office  [phone]  CC: Primary care physician; No PCP Per Patient

## 2015-05-25 NOTE — Progress Notes (Signed)
VSS. O2 sats 97% on room air. Denies pain. Tolerates diet. Discharge instructions and prescriptions given and explained to pt. Educational handouts about prednisone, azithromycin and smoking cessation given and explained to pt. Pt verbalized understanding. Awaiting taxi to transport pt.

## 2015-05-25 NOTE — Progress Notes (Signed)
Clinical Education officer, museum (CSW) informed by RN that patient is ready for discharge and has no way home.  Patient ready for discharge since the AM, he is unable to contact anyone to pick him up.  CSW was asked to provide patient with taxi voucher.  CSW met with patient to confirm no other options or resources to assist with getting him home on Sunday.  Taxi voucher given to RN to call for transportation.   Casimer Lanius. Latanya Presser, MSW Clinical Social Work Department Emergency Room [phone] 11:19 AM

## 2015-05-26 LAB — COMPREHENSIVE METABOLIC PANEL
ALBUMIN: 3.7 g/dL (ref 3.5–5.0)
ALT: 16 U/L — ABNORMAL LOW (ref 17–63)
ANION GAP: 10 (ref 5–15)
AST: 21 U/L (ref 15–41)
Alkaline Phosphatase: 108 U/L (ref 38–126)
BUN: 12 mg/dL (ref 6–20)
CO2: 23 mmol/L (ref 22–32)
Calcium: 8 mg/dL — ABNORMAL LOW (ref 8.9–10.3)
Chloride: 85 mmol/L — ABNORMAL LOW (ref 101–111)
Creatinine, Ser: 0.99 mg/dL (ref 0.61–1.24)
GFR calc Af Amer: >60 mL/min (ref >60)
GFR calc non Af Amer: >60 mL/min (ref >60)
GLUCOSE: 82 mg/dL (ref 65–99)
POTASSIUM: 2.9 mmol/L — AB (ref 3.5–5.1)
SODIUM: 118 mmol/L — AB (ref 135–145)
Total Bilirubin: 1.3 mg/dL — ABNORMAL HIGH (ref 0.3–1.2)
Total Protein: 6.3 g/dL — ABNORMAL LOW (ref 6.5–8.1)

## 2015-08-13 ENCOUNTER — Emergency Department
Admission: EM | Admit: 2015-08-13 | Discharge: 2015-08-14 | Disposition: A | Discharge status: Other Acute Inpatient Hospital | Payer: Medicaid Other | Attending: Emergency Medicine | Admitting: Emergency Medicine

## 2015-08-13 ENCOUNTER — Emergency Department: Payer: Medicaid Other

## 2015-08-13 DIAGNOSIS — F1310 Sedative, hypnotic or anxiolytic abuse, uncomplicated: Secondary | ICD-10-CM

## 2015-08-13 DIAGNOSIS — R410 Disorientation, unspecified: Secondary | ICD-10-CM

## 2015-08-13 DIAGNOSIS — F411 Generalized anxiety disorder: Secondary | ICD-10-CM

## 2015-08-13 DIAGNOSIS — Z043 Encounter for examination and observation following other accident: Secondary | ICD-10-CM | POA: Diagnosis not present

## 2015-08-13 DIAGNOSIS — W01198A Fall on same level from slipping, tripping and stumbling with subsequent striking against other object, initial encounter: Secondary | ICD-10-CM | POA: Diagnosis not present

## 2015-08-13 DIAGNOSIS — F17210 Nicotine dependence, cigarettes, uncomplicated: Secondary | ICD-10-CM | POA: Diagnosis not present

## 2015-08-13 DIAGNOSIS — Z7984 Long term (current) use of oral hypoglycemic drugs: Secondary | ICD-10-CM | POA: Insufficient documentation

## 2015-08-13 DIAGNOSIS — F329 Major depressive disorder, single episode, unspecified: Secondary | ICD-10-CM | POA: Diagnosis not present

## 2015-08-13 DIAGNOSIS — E119 Type 2 diabetes mellitus without complications: Secondary | ICD-10-CM

## 2015-08-13 DIAGNOSIS — Z79899 Other long term (current) drug therapy: Secondary | ICD-10-CM | POA: Insufficient documentation

## 2015-08-13 DIAGNOSIS — R55 Syncope and collapse: Secondary | ICD-10-CM | POA: Diagnosis present

## 2015-08-13 DIAGNOSIS — Y998 Other external cause status: Secondary | ICD-10-CM | POA: Insufficient documentation

## 2015-08-13 DIAGNOSIS — R45851 Suicidal ideations: Secondary | ICD-10-CM | POA: Diagnosis not present

## 2015-08-13 DIAGNOSIS — I10 Essential (primary) hypertension: Secondary | ICD-10-CM | POA: Diagnosis not present

## 2015-08-13 DIAGNOSIS — Y9301 Activity, walking, marching and hiking: Secondary | ICD-10-CM | POA: Diagnosis not present

## 2015-08-13 DIAGNOSIS — Y9289 Other specified places as the place of occurrence of the external cause: Secondary | ICD-10-CM | POA: Diagnosis not present

## 2015-08-13 DIAGNOSIS — Z7982 Long term (current) use of aspirin: Secondary | ICD-10-CM | POA: Insufficient documentation

## 2015-08-13 DIAGNOSIS — F32A Depression, unspecified: Secondary | ICD-10-CM

## 2015-08-13 DIAGNOSIS — J449 Chronic obstructive pulmonary disease, unspecified: Secondary | ICD-10-CM

## 2015-08-13 LAB — CBC WITH DIFFERENTIAL/PLATELET
Basophils Absolute: 0.1 10*3/uL (ref 0–0.1)
Basophils Relative: 1 %
EOS ABS: 0.1 10*3/uL (ref 0–0.7)
EOS PCT: 1 %
HCT: 42.8 % (ref 40.0–52.0)
Hemoglobin: 14.1 g/dL (ref 13.0–18.0)
LYMPHS ABS: 4.8 10*3/uL — AB (ref 1.0–3.6)
LYMPHS PCT: 42 %
MCH: 27.4 pg (ref 26.0–34.0)
MCHC: 32.9 g/dL (ref 32.0–36.0)
MCV: 83.2 fL (ref 80.0–100.0)
MONO ABS: 1.2 10*3/uL — AB (ref 0.2–1.0)
Monocytes Relative: 10 %
Neutro Abs: 5.3 10*3/uL (ref 1.4–6.5)
Neutrophils Relative %: 46 %
PLATELETS: 336 10*3/uL (ref 150–440)
RBC: 5.15 MIL/uL (ref 4.40–5.90)
RDW: 15.7 % — AB (ref 11.5–14.5)
WBC: 11.5 10*3/uL — ABNORMAL HIGH (ref 3.8–10.6)

## 2015-08-13 LAB — URINE DRUG SCREEN, QUALITATIVE (ARMC ONLY)
AMPHETAMINES, UR SCREEN: NEGATIVE — AB
BARBITURATES, UR SCREEN: NEGATIVE — AB
BENZODIAZEPINE, UR SCRN: POSITIVE — AB
Cannabinoid 50 Ng, Ur ~~LOC~~: NEGATIVE — AB
Cocaine Metabolite,Ur ~~LOC~~: NEGATIVE — AB
MDMA (Ecstasy)Ur Screen: NEGATIVE — AB
METHADONE SCREEN, URINE: NEGATIVE — AB
Opiate, Ur Screen: NEGATIVE — AB
PHENCYCLIDINE (PCP) UR S: NEGATIVE — AB
Tricyclic, Ur Screen: POSITIVE — AB

## 2015-08-13 LAB — COMPREHENSIVE METABOLIC PANEL
ALBUMIN: 3.7 g/dL (ref 3.5–5.0)
ALT: 11 U/L — AB (ref 17–63)
AST: 12 U/L — AB (ref 15–41)
Alkaline Phosphatase: 104 U/L (ref 38–126)
Anion gap: 4 — ABNORMAL LOW (ref 5–15)
BILIRUBIN TOTAL: 0.3 mg/dL (ref 0.3–1.2)
BUN: 6 mg/dL (ref 6–20)
CHLORIDE: 103 mmol/L (ref 101–111)
CO2: 28 mmol/L (ref 22–32)
CREATININE: 0.86 mg/dL (ref 0.61–1.24)
Calcium: 8.8 mg/dL — ABNORMAL LOW (ref 8.9–10.3)
GFR calc Af Amer: >60 mL/min (ref >60)
GFR calc non Af Amer: >60 mL/min (ref >60)
GLUCOSE: 70 mg/dL (ref 65–99)
POTASSIUM: 3.7 mmol/L (ref 3.5–5.1)
Sodium: 135 mmol/L (ref 135–145)
TOTAL PROTEIN: 6.7 g/dL (ref 6.5–8.1)

## 2015-08-13 LAB — URINALYSIS COMPLETE WITH MICROSCOPIC (ARMC ONLY)
Bacteria, UA: NONE SEEN
Bilirubin Urine: NEGATIVE
GLUCOSE, UA: NEGATIVE mg/dL
HGB URINE DIPSTICK: NEGATIVE
KETONES UR: NEGATIVE mg/dL
Nitrite: NEGATIVE
PH: 7 (ref 5.0–8.0)
PROTEIN: NEGATIVE mg/dL
Specific Gravity, Urine: 1.005 (ref 1.005–1.030)
Squamous Epithelial / LPF: NONE SEEN

## 2015-08-13 LAB — ETHANOL

## 2015-08-13 LAB — LACTIC ACID, PLASMA: Lactic Acid, Venous: 1.5 mmol/L (ref 0.5–2.0)

## 2015-08-13 LAB — ACETAMINOPHEN LEVEL: Acetaminophen (Tylenol), Serum: <10 ug/mL — ABNORMAL LOW (ref 10–30)

## 2015-08-13 LAB — LIPASE, BLOOD: LIPASE: 21 U/L (ref 11–51)

## 2015-08-13 LAB — TROPONIN I: Troponin I: <0.03 ng/mL (ref <0.031)

## 2015-08-13 LAB — SALICYLATE LEVEL

## 2015-08-13 LAB — PROTIME-INR
INR: 0.98
Prothrombin Time: 13.2 seconds (ref 11.4–15.0)

## 2015-08-13 LAB — MAGNESIUM: Magnesium: 2 mg/dL (ref 1.7–2.4)

## 2015-08-13 LAB — APTT: APTT: 28 s (ref 24–36)

## 2015-08-13 MED ORDER — SODIUM CHLORIDE 0.9 % IV BOLUS (SEPSIS)
1000.0000 mL | INTRAVENOUS | Status: AC
  Administered 2015-08-13: 1000 mL via INTRAVENOUS

## 2015-08-13 MED ORDER — IBUPROFEN 600 MG PO TABS
ORAL_TABLET | ORAL | Status: AC
  Administered 2015-08-13: 600 mg via ORAL
  Filled 2015-08-13: qty 1

## 2015-08-13 MED ORDER — IBUPROFEN 600 MG PO TABS
600.0000 mg | ORAL_TABLET | Freq: Once | ORAL | Status: AC
  Administered 2015-08-13: 600 mg via ORAL

## 2015-08-13 NOTE — Clinical Social Work Note (Signed)
LCSW met with patient and spoke to son. He reported Dad was got up and fell down and wasn't feeling so good. He was not drinking or anything. Patient stated he was feeling OK  Now and reported he was hungry/ LCSW with nurses permission provided him with a sandwich and 2 pops.   LCSW in the past supported his son in the [facility]U and he reports he has been clean for nearly 2 years.   Patient to be assessed by doctor and will be able to return home with son.

## 2015-08-13 NOTE — ED Notes (Signed)
[place] sandwich tray provided for pt. Pt is alert and calm at this time. Appears cooperative. No distress noted.

## 2015-08-13 NOTE — ED Notes (Signed)

## 2015-08-13 NOTE — BH Assessment (Signed)
Assessment Note  Kristopher ReeveHarmon L Powell is a 59 y.o. male presenting to the ED after collapsing at home.  Patient's son reports that during a conversation last night about the patient's poor living conditions and poor state of health, the patient made a comment that it would be better for him to die now because with his disease  He's "going to die soon anyway" and that he wishes he would go to sleep and not ever wake up.Patient reportedly told the son that he knows what to say to get discharged from the ED.  Pt denies SI/HI and any alcohol/drug use.  Pt denies auditory/visual hallucinations.    Diagnosis: Depression  Past Medical History:  Past Medical History  Diagnosis Date  . COPD (chronic obstructive pulmonary disease) (HCC)   . Diabetes mellitus without complication (HCC)   . CHF (congestive heart failure) (HCC)   . Hypertension   . MI (myocardial infarction) (HCC) 2006    Past Surgical History  Procedure Laterality Date  . Coronary angioplasty with stent placement    . Appendectomy    . Cholecystectomy      Family History:  Family History  Problem Relation Age of Onset  . CAD Mother   . Stroke Father     Social History:  reports that he has been smoking Cigarettes.  He has a 80 pack-year smoking history. He does not have any smokeless tobacco history on file. He reports that he does not drink alcohol or use illicit drugs.  Additional Social History:  Alcohol / Drug Use History of alcohol / drug use?: No history of alcohol / drug abuse  CIWA: CIWA-Ar BP: 111/68 mmHg Pulse Rate: 79 COWS:    Allergies: No Known Allergies  Home Medications:  (Not in a hospital admission)  OB/GYN Status:  No LMP for male patient.  General Assessment Data [facility] Assessment: In system Is this a Tele or Face-to-Face Assessment?: Face-to-Face Is this an Initial Assessment or a Re-assessment for this encounter?: Initial Assessment Marital status: Single Maiden  name: N/A Is patient pregnant?: No Pregnancy Status: No Living Arrangements: Alone Can pt return to current living arrangement?: Yes Admission Status: Involuntary Is patient capable of signing voluntary admission?: Yes Referral Source: Self/Family/Friend Insurance type: None  Medical Screening Exam [facility] ONLY) Medical Exam completed: Yes  Crisis Care Plan Living Arrangements: Alone Legal Guardian: Other: (self) Name of Psychiatrist: None reported Name of Therapist: None reported  Education Status Is patient currently in school?: No Current Grade: N/A Highest grade of school patient has completed: N/A Name of school: N/A Contact person: N/A  Risk to self with the past 6 months Suicidal Ideation: Yes-Currently Present Has patient been a risk to self within the past 6 months prior to admission? : No Suicidal Intent: No Has patient had any suicidal intent within the past 6 months prior to admission? : No Is patient at risk for suicide?: No Suicidal Plan?: No Has patient had any suicidal plan within the past 6 months prior to admission? : No Access to Means: Yes Specify Access to Suicidal Means: Prescription medications What has been your use of drugs/alcohol within the last 12 months?: None reported Previous Attempts/Gestures: Yes How many times?: 3 Other Self Harm Risks: None identified Triggers for Past Attempts: Other (Comment) (Health condition) Intentional Self Injurious Behavior: None Family Suicide History: No Recent stressful life event(s): Recent negative physical changes Persecutory voices/beliefs?: No Depression: Yes Depression Symptoms: Loss of interest in usual pleasures,  Feeling worthless/self pity Substance abuse history and/or treatment for substance abuse?: No Suicide prevention information given to non-admitted patients: Not applicable  Risk to Others within the past 6 months Homicidal Ideation: No Does patient have any lifetime risk of  violence toward others beyond the six months prior to admission? : No Thoughts of Harm to Others: No Current Homicidal Intent: No Current Homicidal Plan: No Access to Homicidal Means: No Identified Victim: None reported History of harm to others?: No Assessment of Violence: None Noted Violent Behavior Description: None reported Does patient have access to weapons?: No Criminal Charges Pending?: No Does patient have a court date: No Is patient on probation?: No  Psychosis Hallucinations: None noted Delusions: None noted  Mental Status Report Appearance/Hygiene: In scrubs Eye Contact: Poor Motor Activity: Freedom of movement Speech: Logical/coherent Level of Consciousness: Drowsy Mood: Depressed Affect: Depressed Anxiety Level: None Thought Processes: Coherent, Relevant Judgement: Partial Orientation: Person, Place, Time, Situation, Appropriate for developmental age Obsessive Compulsive Thoughts/Behaviors: None  Cognitive Functioning Concentration: Good Memory: Recent Intact, Remote Intact IQ: Average Insight: Fair Impulse Control: Fair Appetite: Fair Weight Loss: 0 Weight Gain: 0 Sleep: No Change Total Hours of Sleep: 0 Vegetative Symptoms: None  ADLScreening [facility] Assessment Services) Patient's cognitive ability adequate to safely complete daily activities?: Yes Patient able to express need for assistance with ADLs?: Yes Independently performs ADLs?: Yes (appropriate for developmental age)  Prior Inpatient Therapy Prior Inpatient Therapy: No Prior Therapy Dates: N/A Prior Therapy Facilty/Provider(s): N/A Reason for Treatment: N/A  Prior Outpatient Therapy Prior Outpatient Therapy: No Prior Therapy Dates: N/A Prior Therapy Facilty/Provider(s): N/A Reason for Treatment: N/A Does patient have an ACCT team?: No Does patient have Intensive In-House Services?  : No Does patient have [facility] services? : No Does patient have P4CC services?: No  ADL Screening  (condition at time of admission) Patient's cognitive ability adequate to safely complete daily activities?: Yes Patient able to express need for assistance with ADLs?: Yes Independently performs ADLs?: Yes (appropriate for developmental age)       Abuse/Neglect Assessment (Assessment to be complete while patient is alone) Physical Abuse: Denies Verbal Abuse: Denies Sexual Abuse: Denies Exploitation of patient/patient's resources: Denies Self-Neglect: Denies Values / Beliefs Cultural Requests During Hospitalization: None Spiritual Requests During Hospitalization: None Consults Spiritual Care Consult Needed: No Social Work Consult Needed: No Merchant navy officer (For Healthcare) Does patient have an advance directive?: No    Additional Information 1:1 In Past 12 Months?: No CIRT Risk: No Elopement Risk: No Does patient have medical clearance?: Yes     Disposition:  Disposition Initial Assessment Completed for this Encounter: Yes Disposition of Patient: Other dispositions (Psych MD consult) Other disposition(s): Other (Comment) (Psych MD consult)  On Site Evaluation by:   Reviewed with Physician:    [place] 08/13/2015 9:35 PM

## 2015-08-13 NOTE — ED Notes (Signed)
Patient to ED-[facility] from ED ambulatory without difficulty to room #1.  Patient is alert and oriented, calm and cooperative and in no apparent distress.  Patient denies any pain currently.  Patient is oriented to the unit.  Patient denies suicidal ideation, homicidal ideation, auditory or visual hallucinations currently.  Patient is made aware of security cameras and q.15 minute checks.  Patient is encouraged to notify staff with any questions or concerns.

## 2015-08-13 NOTE — ED Notes (Signed)
BEHAVIORAL HEALTH ROUNDING Patient sleeping: No. Patient alert and oriented: yes Behavior appropriate: Yes.  ; If no, describe:  Nutrition and fluids offered: Yes  Toileting and hygiene offered: Yes  Sitter present: yes Law enforcement present: Yes  

## 2015-08-13 NOTE — ED Notes (Signed)
Pt arrived via EMS, pt's son reports father was at home when he lost consciousness. Pt has hx of suicide attempts per son
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
## 2015-08-13 NOTE — ED Notes (Signed)
Report received from Andrea RN.

## 2015-08-14 DIAGNOSIS — R45851 Suicidal ideations: Secondary | ICD-10-CM

## 2015-08-14 DIAGNOSIS — E119 Type 2 diabetes mellitus without complications: Secondary | ICD-10-CM

## 2015-08-14 DIAGNOSIS — J449 Chronic obstructive pulmonary disease, unspecified: Secondary | ICD-10-CM

## 2015-08-14 DIAGNOSIS — F1310 Sedative, hypnotic or anxiolytic abuse, uncomplicated: Secondary | ICD-10-CM

## 2015-08-14 DIAGNOSIS — R410 Disorientation, unspecified: Secondary | ICD-10-CM

## 2015-08-14 DIAGNOSIS — F411 Generalized anxiety disorder: Secondary | ICD-10-CM

## 2015-08-14 LAB — BLOOD GAS, ARTERIAL
ALLENS TEST (PASS/FAIL): POSITIVE — AB
Acid-Base Excess: 0.7 mmol/L (ref 0.0–3.0)
BICARBONATE: 25.4 meq/L (ref 21.0–28.0)
FIO2: 0.21
O2 Saturation: 93.2 %
PATIENT TEMPERATURE: 37
PH ART: 7.41 (ref 7.350–7.450)
PO2 ART: 67 mmHg — AB (ref 83.0–108.0)
pCO2 arterial: 40 mmHg (ref 32.0–48.0)

## 2015-08-14 NOTE — ED Notes (Signed)
Patient asleep in room. No noted distress or abnormal behavior. Will continue 15 minute checks and observation by security cameras for safety. 

## 2015-08-14 NOTE — ED Notes (Signed)
Pt ambulated to the bathroom to void. Pt returned to his room and sat on the bed to watch TV.

## 2015-08-14 NOTE — ED Notes (Signed)
Pt making calls to friends and family to try and find a ride. No success yet. Maintained on 15 minute checks and observation by security camera for safety.

## 2015-08-14 NOTE — ED Notes (Signed)
Belongings sent with patient. Patient denies SI/HI and AVH. 

## 2015-08-14 NOTE — ED Notes (Signed)
Pt waiting patiently to be discharged.  Maintained on 15 minute checks and observation by security camera for safety.

## 2015-08-14 NOTE — Discharge Instructions (Signed)
Suicidal Feelings: How to Help Yourself °Suicide is the taking of one's own life. If you feel as though life is getting too tough to handle and are thinking about suicide, get help right away. To get help: °· Call your local emergency services (911 in the [place] °· Call a suicide hotline to speak with a trained counselor who understands how you are feeling. The following is a list of suicide hotlines in the [place]. For a list of hotlines in [place], visit www.suicide.org/hotlines/international/[place]-suicide-hotlines.html. °¨  1-800-273-TALK (1-800-273-8255). °¨  1-800-SUICIDE (1-800-784-2433). °¨  1-888-628-9454. This is a hotline for Spanish speakers. °¨  1-800-799-4TTY (1-800-799-4889). This is a hotline for TTY users. °¨  1-866-4-U-TREVOR (1-866-488-7386). This is a hotline for lesbian, gay, bisexual, transgender, or questioning youth. °· Contact a crisis center or a local suicide prevention center. To find a crisis center or suicide prevention center: °¨ Call your local hospital, clinic, community service organization, mental health center, social service provider, or health department. Ask for assistance in connecting to a crisis center. °¨ Visit www.suicidepreventionlifeline.org/getinvolved/locator for a list of crisis centers in the [place], or visit www.suicideprevention.[place]/thinking-about-suicide/find-a-crisis-centre for a list of centers in [place]. °· Visit the following websites: °¨  National Suicide Prevention Lifeline: www.suicidepreventionlifeline.org °¨  Hopeline: www.hopeline.com °¨  American Foundation for Suicide Prevention: www.afsp.org °¨  The Trevor Project (for lesbian, gay, bisexual, transgender, or questioning youth): www.thetrevorproject.org °HOW CAN I HELP MYSELF FEEL BETTER? °· Promise yourself that you will not do anything drastic when you have suicidal feelings. Remember, there is hope. Many people have gotten through suicidal thoughts and feelings, and you will, too. You may  have gotten through them before, and this proves that you can get through them again. °· Let family, friends, teachers, or counselors know how you are feeling. Try not to isolate yourself from those who care about you. Remember, they will want to help you. Talk with someone every day, even if you do not feel sociable. Face-to-face conversation is best. °· Call a mental health professional and see one regularly. °· Visit your primary health care provider every year. °· Eat a well-balanced diet, and space your meals so you eat regularly. °· Get plenty of rest. °· Avoid alcohol and drugs, and remove them from your home. They will only make you feel worse. °· If you are thinking of taking a lot of medicine, give your medicine to someone who can give it to you one day at a time. If you are on antidepressants and are concerned you will overdose, let your health care provider know so he or she can give you safer medicines. Ask your mental health professional about the possible side effects of any medicines you are taking. °· Remove weapons, poisons, knives, and anything else that could harm you from your home. °· Try to stick to routines. Follow a schedule every day. Put self-care on your schedule. °· Make a list of realistic goals, and cross them off when you achieve them. Accomplishments give a sense of worth. °· Wait until you are feeling better before doing the things you find difficult or unpleasant. °· Exercise if you are able. You will feel better if you exercise for even a half hour each day. °· Go out in the sun or into nature. This will help you recover from depression faster. If you have a favorite place to walk, go there. °· Do the things that have always given you pleasure. Play your favorite music, read a good book, paint a picture, play your favorite instrument, or do anything   else that takes your mind off your depression if it is safe to do. °· Keep your living space well lit. °· When you are feeling well,  write yourself a letter about tips and support that you can read when you are not feeling well. °· Remember that life's difficulties can be sorted out with help. Conditions can be treated. You can work on thoughts and strategies that serve you well. °  °This information is not intended to replace advice given to you by your health care provider. Make sure you discuss any questions you have with your health care provider. °  °Document Released: 02/06/2003 Document Revised: 08/23/2014 Document Reviewed: 11/27/2013 °Elsevier Interactive Patient Education ©2016 [employer] ° °

## 2015-08-14 NOTE — ED Notes (Signed)
Patient ready for discharge. 
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
## 2015-08-14 NOTE — ED Notes (Signed)
Pt coming to nurses station asking what time he will be discharged. RN explained he can can not be discharged until all paperwork is finished.  Pt accepting. Maintained on 15 minute checks and observation by security camera for safety.

## 2015-08-14 NOTE — ED Provider Notes (Signed)
-----------------------------------------   3:30 PM on 08/14/2015 -----------------------------------------  Patient is medically stable in the emergency department with unremarkable vital signs. Case discussed with Dr. Toni Amendlapacs of psychiatry after his evaluation, and he feels the patient is psychiatrically stable. No SI or HI or hallucinations. Has a history of benzodiazepine abuse. Will follow-up with Dr. Elesa MassedWard his outpatient psychiatrist. IVC rescinded by Dr. Jenene Slickerlapacs  Reba Hulett, MD 08/14/15 [phone]

## 2015-08-14 NOTE — ED Notes (Signed)
Pt denies SI/HI and AVH. Pain 8/10 in head from fall yesterday. Pt insists he did not overdose on pills or try to hurt himself in any way. Pt stated "I took trazodone, i hadn't taken it in a while..." "I fell and hit my head on the door."  Pt asked "How soon am I getting out of here?" RN explained the psychiatrist would be in to see him and he would make the decision of what happens next. Pt cooperative. Maintained on 15 minute checks and observation by security camera for safety.

## 2015-08-14 NOTE — ED Notes (Signed)
Pt ambulated to the bathroom to void, returned to his room and lay on the bed to watch TV.

## 2015-08-14 NOTE — Consult Note (Signed)
[facility] Consult   Reason for Consult:  Consult for 59 year old man came in to the hospital after having a head injury. Reports were made about suicidal thoughts Referring Physician:  Joni Fears Patient Identification: Kristopher Powell MRN:  [id number] Principal Diagnosis: Acute delirium Diagnosis:   Patient Active Problem List   Diagnosis Date Noted  . Generalized anxiety disorder [F41.1] 08/14/2015  . Benzodiazepine abuse [F13.10] 08/14/2015  . Acute delirium [R41.0] 08/14/2015  . Diabetes ([place]) [E11.9] 08/14/2015  . COPD (chronic obstructive pulmonary disease) ([place]) [J44.9] 08/14/2015  . Hyponatremia [E87.1] 05/23/2015  . Hypokalemia [E87.6] 05/23/2015    Total Time spent with patient: 1 hour  Subjective:   Kristopher Powell is a 59 y.o. male patient admitted with "I just hit my head on a door".  HPI:  Information from the patient and the chart. Patient interviewed. Chart reviewed. Old notes reviewed. Labs reviewed. Patient was brought into the hospital after having a head injury last night bumping his head on a door and then falling down on the floor. His son evidently had claimed that the patient made suicidal statements. The patient totally denies having made any suicidal statements and says he is not aware of having had any suicidal thoughts anytime recently. He says that his mood is been okay's nerves of been a little bit bad but about average. He hadn't been sleeping for a few days. A big part of that was because he had been off his medicine for about a week. He claims that he hasn't been able to get his medicines filled because there is been a problem with his Medicaid. He found some medicine around the house and says he took his usual medicines of Risperdal trazodone imipramine and Valium. He got up abruptly and tripped and fell and hit his head. Says he has been trying to keep up with his metformin and amlodipine and Ventolin but has had trouble with that also because  of finances. Denies that he's been drinking recently. Denies any drug abuse.  Social history: Patient lives by himself. Son was over visiting. Patient says that he normally has Medicaid but for some reason it's been canceled and he is working on getting it restarted.  Medical history: Multiple medical problems history of hypertension and diabetes. History of myocardial infarction in the past. Acute head injury. No evidence of any intracranial injury or fracture.  Substance abuse history: Past history of alcohol abuse but says he quit years ago. He's had previous hospitalizations where there was evidence that he had overused benzodiazepines.  Past Psychiatric History: Patient has a positive family history with substance abuse in his adult children.  Risk to Self: Suicidal Ideation: Yes-Currently Present Suicidal Intent: No Is patient at risk for suicide?: No Suicidal Plan?: No Access to Means: Yes Specify Access to Suicidal Means: Prescription medications What has been your use of drugs/alcohol within the last 12 months?: None reported How many times?: 3 Other Self Harm Risks: None identified Triggers for Past Attempts: Other (Comment) (Health condition) Intentional Self Injurious Behavior: None Risk to Others: Homicidal Ideation: No Thoughts of Harm to Others: No Current Homicidal Intent: No Current Homicidal Plan: No Access to Homicidal Means: No Identified Victim: None reported History of harm to others?: No Assessment of Violence: None Noted Violent Behavior Description: None reported Does patient have access to weapons?: No Criminal Charges Pending?: No Does patient have a court date: No Prior Inpatient Therapy: Prior Inpatient Therapy: No Prior Therapy Dates: N/A Prior Therapy Facilty/Provider(s):  N/A Reason for Treatment: N/A Prior Outpatient Therapy: Prior Outpatient Therapy: No Prior Therapy Dates: N/A Prior Therapy Facilty/Provider(s): N/A Reason for Treatment:  N/A Does patient have an ACCT team?: No Does patient have Intensive In-House Services?  : No Does patient have [facility] services? : No Does patient have P4CC services?: No  Past Medical History:  Past Medical History  Diagnosis Date  . COPD (chronic obstructive pulmonary disease) ([place])   . Diabetes mellitus without complication ([place])   . CHF (congestive heart failure) ([place])   . Hypertension   . MI (myocardial infarction) ([place]) 2006    Past Surgical History  Procedure Laterality Date  . Coronary angioplasty with stent placement    . Appendectomy    . Cholecystectomy     Family History:  Family History  Problem Relation Age of Onset  . CAD Mother   . Stroke Father    Family Psychiatric  History: Patient has a past history of prior psychiatric hospitalization but was many years ago. Says that he has never tried to kill himself in the past. Says previous hospitalizations were largely related to substance abuse. Says that he currently sees Dr. Leonides Schanz for outpatient treatment and last saw him about a week ago Social History:  History  Alcohol Use No     History  Drug Use No    Social History   Social History  . Marital Status: Single    Spouse Name: N/A  . Number of Children: N/A  . Years of Education: N/A   Social History Main Topics  . Smoking status: Current Every Day Smoker -- 2.00 packs/day for 40 years    Types: Cigarettes  . Smokeless tobacco: None  . Alcohol Use: No  . Drug Use: No  . Sexual Activity: Not Asked   Other Topics Concern  . None   Social History Narrative   Additional Social History:    History of alcohol / drug use?: No history of alcohol / drug abuse                     Allergies:  No Known Allergies  Labs:  Results for orders placed or performed during the hospital encounter of 08/13/15 (from the past 48 hour(s))  Blood gas, arterial     Status: Abnormal (Preliminary result)   Collection Time: 08/13/15  3:50 PM  Result Value  Ref Range   FIO2 0.21    Delivery systems PENDING    Inspiratory PAP PENDING    Expiratory PAP PENDING    pH, Arterial 7.41 7.350 - 7.450   pCO2 arterial 40 32.0 - 48.0 mmHg   pO2, Arterial 67 (L) 83.0 - 108.0 mmHg   Bicarbonate 25.4 21.0 - 28.0 mEq/L   Acid-Base Excess 0.7 0.0 - 3.0 mmol/L   O2 Saturation 93.2 %   Patient temperature 37.0    Oxygen index PENDING    Collection site RIGHT RADIAL    Sample type ARTERIAL DRAW    Allens test (pass/fail) POSITIVE (A) PASS  Comprehensive metabolic panel     Status: Abnormal   Collection Time: 08/13/15  3:54 PM  Result Value Ref Range   Sodium 135 135 - 145 mmol/L   Potassium 3.7 3.5 - 5.1 mmol/L   Chloride 103 101 - 111 mmol/L   CO2 28 22 - 32 mmol/L   Glucose, Bld 70 65 - 99 mg/dL   BUN 6 6 - 20 mg/dL   Creatinine, Ser 0.86 0.61 - 1.24 mg/dL  Calcium 8.8 (L) 8.9 - 10.3 mg/dL   Total Protein 6.7 6.5 - 8.1 g/dL   Albumin 3.7 3.5 - 5.0 g/dL   AST 12 (L) 15 - 41 U/L   ALT 11 (L) 17 - 63 U/L   Alkaline Phosphatase 104 38 - 126 U/L   Total Bilirubin 0.3 0.3 - 1.2 mg/dL   GFR calc non Af Amer >60 >60 mL/min   GFR calc Af Amer >60 >60 mL/min    Comment: (NOTE) The eGFR has been calculated using the CKD EPI equation. This calculation has not been validated in all clinical situations. eGFR's persistently <60 mL/min signify possible Chronic Kidney Disease.    Anion gap 4 (L) 5 - 15  CBC with Differential/Platelet     Status: Abnormal   Collection Time: 08/13/15  3:54 PM  Result Value Ref Range   WBC 11.5 (H) 3.8 - 10.6 K/uL   RBC 5.15 4.40 - 5.90 MIL/uL   Hemoglobin 14.1 13.0 - 18.0 g/dL   HCT 42.8 40.0 - 52.0 %   MCV 83.2 80.0 - 100.0 fL   MCH 27.4 26.0 - 34.0 pg   MCHC 32.9 32.0 - 36.0 g/dL   RDW 15.7 (H) 11.5 - 14.5 %   Platelets 336 150 - 440 K/uL   Neutrophils Relative % 46 %   Neutro Abs 5.3 1.4 - 6.5 K/uL   Lymphocytes Relative 42 %   Lymphs Abs 4.8 (H) 1.0 - 3.6 K/uL   Monocytes Relative 10 %   Monocytes Absolute  1.2 (H) 0.2 - 1.0 K/uL   Eosinophils Relative 1 %   Eosinophils Absolute 0.1 0 - 0.7 K/uL   Basophils Relative 1 %   Basophils Absolute 0.1 0 - 0.1 K/uL  Salicylate level     Status: None   Collection Time: 08/13/15  3:54 PM  Result Value Ref Range   Salicylate Lvl <0.3 2.8 - 30.0 mg/dL  Acetaminophen level     Status: Abnormal   Collection Time: 08/13/15  3:54 PM  Result Value Ref Range   Acetaminophen (Tylenol), Serum <10 (L) 10 - 30 ug/mL    Comment:        THERAPEUTIC CONCENTRATIONS VARY SIGNIFICANTLY. A RANGE OF 10-30 ug/mL MAY BE AN EFFECTIVE CONCENTRATION FOR MANY PATIENTS. HOWEVER, SOME ARE BEST TREATED AT CONCENTRATIONS OUTSIDE THIS RANGE. ACETAMINOPHEN CONCENTRATIONS >150 ug/mL AT 4 HOURS AFTER INGESTION AND >50 ug/mL AT 12 HOURS AFTER INGESTION ARE OFTEN ASSOCIATED WITH TOXIC REACTIONS.   Lactic acid, plasma     Status: None   Collection Time: 08/13/15  3:54 PM  Result Value Ref Range   Lactic Acid, Venous 1.5 0.5 - 2.0 mmol/L  Troponin I     Status: None   Collection Time: 08/13/15  3:54 PM  Result Value Ref Range   Troponin I <0.03 <0.031 ng/mL    Comment:        NO INDICATION OF MYOCARDIAL INJURY.   Magnesium     Status: None   Collection Time: 08/13/15  3:54 PM  Result Value Ref Range   Magnesium 2.0 1.7 - 2.4 mg/dL  Protime-INR     Status: None   Collection Time: 08/13/15  3:54 PM  Result Value Ref Range   Prothrombin Time 13.2 11.4 - 15.0 seconds   INR 0.98   APTT     Status: None   Collection Time: 08/13/15  3:54 PM  Result Value Ref Range   aPTT 28 24 - 36 seconds  Lipase, blood  Status: None   Collection Time: 08/13/15  3:54 PM  Result Value Ref Range   Lipase 21 11 - 51 U/L  Urinalysis complete, with microscopic ([facility] only)     Status: Abnormal   Collection Time: 08/13/15  3:54 PM  Result Value Ref Range   Color, Urine YELLOW (A) YELLOW   APPearance CLEAR (A) CLEAR   Glucose, UA NEGATIVE NEGATIVE mg/dL   Bilirubin Urine NEGATIVE  NEGATIVE   Ketones, ur NEGATIVE NEGATIVE mg/dL   Specific Gravity, Urine 1.005 1.005 - 1.030   Hgb urine dipstick NEGATIVE NEGATIVE   pH 7.0 5.0 - 8.0   Protein, ur NEGATIVE NEGATIVE mg/dL   Nitrite NEGATIVE NEGATIVE   Leukocytes, UA TRACE (A) NEGATIVE   RBC / HPF 0-5 0 - 5 RBC/hpf   WBC, UA 0-5 0 - 5 WBC/hpf   Bacteria, UA NONE SEEN NONE SEEN   Squamous Epithelial / LPF NONE SEEN NONE SEEN   Mucous PRESENT   Urine Drug Screen, Qualitative ([facility] only)     Status: Abnormal   Collection Time: 08/13/15  3:54 PM  Result Value Ref Range   Tricyclic, Ur Screen POSITIVE (A) NONE DETECTED   Amphetamines, Ur Screen NEGATIVE (A) NONE DETECTED   MDMA (Ecstasy)Ur Screen NEGATIVE (A) NONE DETECTED   Cocaine Metabolite,Ur [place] NEGATIVE (A) NONE DETECTED   Opiate, Ur Screen NEGATIVE (A) NONE DETECTED   Phencyclidine (PCP) Ur S NEGATIVE (A) NONE DETECTED   Cannabinoid 50 Ng, Ur [place] NEGATIVE (A) NONE DETECTED   Barbiturates, Ur Screen NEGATIVE (A) NONE DETECTED   Benzodiazepine, Ur Scrn POSITIVE (A) NONE DETECTED   Methadone Scn, Ur NEGATIVE (A) NONE DETECTED    Comment: (NOTE) 923  Tricyclics, urine               Cutoff 1000 ng/mL 200  Amphetamines, urine             Cutoff 1000 ng/mL 300  MDMA (Ecstasy), urine           Cutoff 500 ng/mL 400  Cocaine Metabolite, urine       Cutoff 300 ng/mL 500  Opiate, urine                   Cutoff 300 ng/mL 600  Phencyclidine (PCP), urine      Cutoff 25 ng/mL 700  Cannabinoid, urine              Cutoff 50 ng/mL 800  Barbiturates, urine             Cutoff 200 ng/mL 900  Benzodiazepine, urine           Cutoff 200 ng/mL 1000 Methadone, urine                Cutoff 300 ng/mL 1100 1200 The urine drug screen provides only a preliminary, unconfirmed 1300 analytical test result and should not be used for non-medical 1400 purposes. Clinical consideration and professional judgment should 1500 be applied to any positive drug screen result due to possible 1600  interfering substances. A more specific alternate chemical method 1700 must be used in order to obtain a confirmed analytical result.  1800 Gas chromato graphy / mass spectrometry (GC/MS) is the preferred 1900 confirmatory method.   Urine culture     Status: None (Preliminary result)   Collection Time: 08/13/15  3:54 PM  Result Value Ref Range   Specimen Description URINE, RANDOM    Special Requests Normal    Culture NO GROWTH <  24 HOURS    Report Status PENDING   Ethanol     Status: None   Collection Time: 08/13/15  9:28 PM  Result Value Ref Range   Alcohol, Ethyl (B) <5 <5 mg/dL    Comment:        LOWEST DETECTABLE LIMIT FOR SERUM ALCOHOL IS 5 mg/dL FOR MEDICAL PURPOSES ONLY     No current facility-administered medications for this encounter.   Current Outpatient Prescriptions  Medication Sig Dispense Refill  . albuterol (PROVENTIL HFA;VENTOLIN HFA) 108 (90 Base) MCG/ACT inhaler Inhale 2 puffs into the lungs every 6 (six) hours as needed for wheezing or shortness of breath.    Marland Kitchen amLODipine (NORVASC) 5 MG tablet Take 5 mg by mouth daily.    Marland Kitchen aspirin EC 325 MG tablet Take 325 mg by mouth daily.    Marland Kitchen atorvastatin (LIPITOR) 80 MG tablet Take 80 mg by mouth at bedtime.     . diazepam (VALIUM) 10 MG tablet Take 10 mg by mouth 3 (three) times daily as needed for anxiety.     Marland Kitchen esomeprazole (NEXIUM) 40 MG capsule Take 40 mg by mouth daily.    . furosemide (LASIX) 20 MG tablet Take 10 mg by mouth daily.    Marland Kitchen gabapentin (NEURONTIN) 300 MG capsule Take 1,200 mg by mouth 3 (three) times daily.     . metFORMIN (GLUCOPHAGE-XR) 500 MG 24 hr tablet Take 500 mg by mouth 2 (two) times daily.     . metoprolol succinate (TOPROL XL) 25 MG 24 hr tablet Take 25 mg by mouth daily.    . risperiDONE (RISPERDAL) 2 MG tablet Take 2 mg by mouth at bedtime.    Marland Kitchen tiotropium (SPIRIVA HANDIHALER) 18 MCG inhalation capsule Place 1 capsule into inhaler and inhale daily.    . traZODone (DESYREL) 100 MG tablet  Take 200 mg by mouth at bedtime.      Musculoskeletal: Strength & Muscle Tone: decreased Gait & Station: unsteady Patient leans: N/A  Psychiatric Specialty Exam: Review of Systems  Constitutional: Negative.   HENT: Negative.   Eyes: Negative.   Respiratory: Positive for cough.   Cardiovascular: Negative.   Gastrointestinal: Positive for heartburn.  Musculoskeletal: Negative.   Skin: Negative.   Neurological: Positive for dizziness.  Psychiatric/Behavioral: Negative for depression, suicidal ideas, hallucinations, memory loss and substance abuse. The patient has insomnia. The patient is not nervous/anxious.     Blood pressure 159/90, pulse 92, temperature 97.5 F (36.4 C), temperature source Oral, resp. rate 18, height 6' (1.829 m), weight 90.719 kg (200 lb), SpO2 100 %.Body mass index is 27.12 kg/(m^2).  General Appearance: Disheveled  Eye Sport and exercise psychologist::  Fair  Speech:  Normal Rate  Volume:  Decreased  Mood:  Anxious  Affect:  Congruent  Thought Process:  Intact  Orientation:  Full (Time, Place, and Person)  Thought Content:  Negative  Suicidal Thoughts:  No  Homicidal Thoughts:  No  Memory:  Immediate;   Fair Recent;   Poor Remote;   Fair  Judgement:  Fair  Insight:  Shallow  Psychomotor Activity:  Decreased  Concentration:  Fair  Recall:  Poor  Fund of Knowledge:Poor  Language: Poor  Akathisia:  No  Handed:  Right  AIMS (if indicated):     Assets:  Financial Resources/Insurance Housing Social Support  ADL's:  Intact  Cognition: Impaired,  Mild  Sleep:      Treatment Plan Summary: Plan 59 year old man brought into the hospital because of an acute head injury. That  was worked up and there is no indication of any need for specific treatment. There were allegations of suicidal thought but that does not seem to be substantiated. Problem with suicidal ideation and has been evaluated in and is now resolved. Problem with acute delirium presumed to be related to the acute  head injury has now resolved. He is alert and oriented 4. Chronic anxiety is being treated as an outpatient by his outpatient psychiatrist does not require any new medicine. Blood pressure is stable diabetes stable no sign of inpatient treatment need. Patient can be discharged from the emergency room no longer committable. Reviewed with the ER doctor. Follow up with outpatient [facility] care doctor and Dr. Leonides Schanz  Disposition: Patient does not meet criteria for psychiatric inpatient admission. Supportive therapy provided about ongoing stressors.  John Clapacs 08/14/2015 1:17 PM

## 2015-08-14 NOTE — Clinical Social Work Note (Signed)
LCSW met with patient and discussed that he will need to follow up with Dr Leonides Schanz. LCSW provided patient with a map and directions and bus Ticket home. He stated he has no way to get to his home and that its a long walk. LCSW provided and reviewed suicide prevention and intervention handout and community resource guide for him to access as needed.Kristopher Powell

## 2015-08-14 NOTE — ED Notes (Signed)
Patient resting quietly in room. No noted distress or abnormal behaviors noted. Will continue 15 minute checks and observation by security camera for safety. 

## 2015-08-14 NOTE — ED Notes (Signed)
Psychiatrist at bedside. Maintained on 15 minute checks and observation by security camera for safety. 

## 2015-08-14 NOTE — ED Provider Notes (Signed)
-----------------------------------------   8:28 AM on 08/14/2015 -----------------------------------------   Blood pressure 111/68, pulse 79, temperature 97.4 F (36.3 C), resp. rate 17, height 6' (1.829 m), weight 200 lb (90.719 kg), SpO2 99 %.  The patient had no acute events since last update.  Calm and cooperative at this time.  Patient to be seen by psych     Rebecka ApleyAllison P Jaelah Hauth, MD 08/14/15 [phone]

## 2015-08-15 LAB — URINE CULTURE: Special Requests: NORMAL

## 2015-08-25 ENCOUNTER — Emergency Department: Payer: Medicaid Other

## 2015-08-25 ENCOUNTER — Encounter: Payer: Self-pay | Admitting: Emergency Medicine

## 2015-08-25 ENCOUNTER — Emergency Department
Admission: EM | Admit: 2015-08-25 | Discharge: 2015-08-25 | Disposition: A | Discharge status: Home / Self Care | Payer: Medicaid Other | Attending: Emergency Medicine | Admitting: Emergency Medicine

## 2015-08-25 DIAGNOSIS — Z7984 Long term (current) use of oral hypoglycemic drugs: Secondary | ICD-10-CM | POA: Insufficient documentation

## 2015-08-25 DIAGNOSIS — E119 Type 2 diabetes mellitus without complications: Secondary | ICD-10-CM | POA: Insufficient documentation

## 2015-08-25 DIAGNOSIS — F17210 Nicotine dependence, cigarettes, uncomplicated: Secondary | ICD-10-CM | POA: Insufficient documentation

## 2015-08-25 DIAGNOSIS — I10 Essential (primary) hypertension: Secondary | ICD-10-CM | POA: Diagnosis not present

## 2015-08-25 DIAGNOSIS — Z7982 Long term (current) use of aspirin: Secondary | ICD-10-CM | POA: Insufficient documentation

## 2015-08-25 DIAGNOSIS — Z79899 Other long term (current) drug therapy: Secondary | ICD-10-CM | POA: Diagnosis not present

## 2015-08-25 DIAGNOSIS — R0602 Shortness of breath: Secondary | ICD-10-CM | POA: Diagnosis present

## 2015-08-25 DIAGNOSIS — R0600 Dyspnea, unspecified: Secondary | ICD-10-CM

## 2015-08-25 DIAGNOSIS — J441 Chronic obstructive pulmonary disease with (acute) exacerbation: Secondary | ICD-10-CM | POA: Diagnosis not present

## 2015-08-25 LAB — BASIC METABOLIC PANEL
ANION GAP: 7 (ref 5–15)
BUN: 8 mg/dL (ref 6–20)
CALCIUM: 8.7 mg/dL — AB (ref 8.9–10.3)
CO2: 29 mmol/L (ref 22–32)
Chloride: 98 mmol/L — ABNORMAL LOW (ref 101–111)
Creatinine, Ser: 0.84 mg/dL (ref 0.61–1.24)
GFR calc Af Amer: >60 mL/min (ref >60)
GLUCOSE: 104 mg/dL — AB (ref 65–99)
POTASSIUM: 3.6 mmol/L (ref 3.5–5.1)
SODIUM: 134 mmol/L — AB (ref 135–145)

## 2015-08-25 LAB — CBC
HEMATOCRIT: 44.3 % (ref 40.0–52.0)
HEMOGLOBIN: 14.9 g/dL (ref 13.0–18.0)
MCH: 27.5 pg (ref 26.0–34.0)
MCHC: 33.7 g/dL (ref 32.0–36.0)
MCV: 81.6 fL (ref 80.0–100.0)
Platelets: 325 10*3/uL (ref 150–440)
RBC: 5.43 MIL/uL (ref 4.40–5.90)
RDW: 15.8 % — AB (ref 11.5–14.5)
WBC: 10.9 10*3/uL — AB (ref 3.8–10.6)

## 2015-08-25 LAB — BLOOD GAS, VENOUS
Acid-Base Excess: 1.5 mmol/L (ref 0.0–3.0)
Bicarbonate: 27.7 mEq/L (ref 21.0–28.0)
PCO2 VEN: 49 mmHg (ref 44.0–60.0)
PH VEN: 7.36 (ref 7.320–7.430)
Patient temperature: 37
pO2, Ven: <31 mmHg (ref 30.0–45.0)

## 2015-08-25 LAB — TROPONIN I

## 2015-08-25 MED ORDER — PREDNISONE 10 MG (21) PO TBPK: 10.0000 mg | ORAL_TABLET | Freq: Every day | ORAL | Status: DC

## 2015-08-25 MED ORDER — AZITHROMYCIN 250 MG PO TABS
500.0000 mg | ORAL_TABLET | Freq: Once | ORAL | Status: AC
  Administered 2015-08-25: 500 mg via ORAL
  Filled 2015-08-25: qty 2

## 2015-08-25 MED ORDER — IPRATROPIUM-ALBUTEROL 0.5-2.5 (3) MG/3ML IN SOLN
3.0000 mL | Freq: Once | RESPIRATORY_TRACT | Status: AC
  Administered 2015-08-25: 3 mL via RESPIRATORY_TRACT
  Filled 2015-08-25: qty 3

## 2015-08-25 MED ORDER — METHYLPREDNISOLONE SODIUM SUCC 125 MG IJ SOLR
125.0000 mg | Freq: Once | INTRAMUSCULAR | Status: AC
  Administered 2015-08-25: 125 mg via INTRAVENOUS
  Filled 2015-08-25: qty 2

## 2015-08-25 NOTE — ED Notes (Signed)
Patient transported to X-ray 
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
## 2015-08-25 NOTE — ED Notes (Signed)
Patient reports that his power is back on.  Patient refuses EMS transport back to his house, states his friend is coming to get him.  I attempted to verify power was restored to his house by calling [employer] and they confirmed no outage.  I informed Dr. Mayford KnifeWilliams, who also agrees that as long as the patient states he has power and means to his oxygen he should be able to be discharged.  I inquired about social work with Dr Mayford KnifeWilliams, per advisement of our director, patient states that he already has a Child psychotherapistsocial worker who is following him and Dr. Mayford KnifeWilliams states he doesn't need another consult because of this.  Patient has extra tanks at home as well.

## 2015-08-25 NOTE — ED Provider Notes (Signed)
Labs Reviewed  BASIC METABOLIC PANEL - Abnormal; Notable for the following:    Sodium 134 (*)    Chloride 98 (*)    Glucose, Bld 104 (*)    Calcium 8.7 (*)    All other components within normal limits  CBC - Abnormal; Notable for the following:    WBC 10.9 (*)    RDW 15.8 (*)    All other components within normal limits  TROPONIN I  BLOOD GAS, VENOUS  TROPONIN I    Serial troponins are negative. He is stable for outpatient follow-up with his doctor.  Emily FilbertJonathan E Williams, MD 08/25/15 1100
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
## 2015-08-25 NOTE — Discharge Instructions (Signed)
Chronic Obstructive Pulmonary Disease Chronic obstructive pulmonary disease (COPD) is a common lung condition in which airflow from the lungs is limited. COPD is a general term that can be used to describe many different lung problems that limit airflow, including both chronic bronchitis and emphysema. If you have COPD, your lung function will probably never return to normal, but there are measures you can take to improve lung function and make yourself feel better. CAUSES   Smoking (common).  Exposure to secondhand smoke.  Genetic problems.  Chronic inflammatory lung diseases or recurrent infections. SYMPTOMS  Shortness of breath, especially with physical activity.  Deep, persistent (chronic) cough with a large amount of thick mucus.  Wheezing.  Rapid breaths (tachypnea).  Gray or bluish discoloration (cyanosis) of the skin, especially in your fingers, toes, or lips.  Fatigue.  Weight loss.  Frequent infections or episodes when breathing symptoms become much worse (exacerbations).  Chest tightness. DIAGNOSIS Your health care provider will take a medical history and perform a physical examination to diagnose COPD. Additional tests for COPD may include:  Lung (pulmonary) function tests.  Chest X-ray.  CT scan.  Blood tests. TREATMENT  Treatment for COPD may include:  Inhaler and nebulizer medicines. These help manage the symptoms of COPD and make your breathing more comfortable.  Supplemental oxygen. Supplemental oxygen is only helpful if you have a low oxygen level in your blood.  Exercise and physical activity. These are beneficial for nearly all people with COPD.  Lung surgery or transplant.  Nutrition therapy to gain weight, if you are underweight.  Pulmonary rehabilitation. This may involve working with a team of health care providers and specialists, such as respiratory, occupational, and physical therapists. HOME CARE INSTRUCTIONS  Take all medicines  (inhaled or pills) as directed by your health care provider.  Avoid over-the-counter medicines or cough syrups that dry up your airway (such as antihistamines) and slow down the elimination of secretions unless instructed otherwise by your health care provider.  If you are a smoker, the most important thing that you can do is stop smoking. Continuing to smoke will cause further lung damage and breathing trouble. Ask your health care provider for help with quitting smoking. He or she can direct you to community resources or hospitals that provide support.  Avoid exposure to irritants such as smoke, chemicals, and fumes that aggravate your breathing.  Use oxygen therapy and pulmonary rehabilitation if directed by your health care provider. If you require home oxygen therapy, ask your health care provider whether you should purchase a pulse oximeter to measure your oxygen level at home.  Avoid contact with individuals who have a contagious illness.  Avoid extreme temperature and humidity changes.  Eat healthy foods. Eating smaller, more frequent meals and resting before meals may help you maintain your strength.  Stay active, but balance activity with periods of rest. Exercise and physical activity will help you maintain your ability to do things you want to do.  Preventing infection and hospitalization is very important when you have COPD. Make sure to receive all the vaccines your health care provider recommends, especially the pneumococcal and influenza vaccines. Ask your health care provider whether you need a pneumonia vaccine.  Learn and use relaxation techniques to manage stress.  Learn and use controlled breathing techniques as directed by your health care provider. Controlled breathing techniques include:  Pursed lip breathing. Start by breathing in (inhaling) through your nose for 1 second. Then, purse your lips as if you were   going to whistle and breathe out (exhale) through the  pursed lips for 2 seconds.  Diaphragmatic breathing. Start by putting one hand on your abdomen just above your waist. Inhale slowly through your nose. The hand on your abdomen should move out. Then purse your lips and exhale slowly. You should be able to feel the hand on your abdomen moving in as you exhale.  Learn and use controlled coughing to clear mucus from your lungs. Controlled coughing is a series of short, progressive coughs. The steps of controlled coughing are: 1. Lean your head slightly forward. 2. Breathe in deeply using diaphragmatic breathing. 3. Try to hold your breath for 3 seconds. 4. Keep your mouth slightly open while coughing twice. 5. Spit any mucus out into a tissue. 6. Rest and repeat the steps once or twice as needed. SEEK MEDICAL CARE IF:  You are coughing up more mucus than usual.  There is a change in the color or thickness of your mucus.  Your breathing is more labored than usual.  Your breathing is faster than usual. SEEK IMMEDIATE MEDICAL CARE IF:  You have shortness of breath while you are resting.  You have shortness of breath that prevents you from:  Being able to talk.  Performing your usual physical activities.  You have chest pain lasting longer than 5 minutes.  Your skin color is more cyanotic than usual.  You measure low oxygen saturations for longer than 5 minutes with a pulse oximeter. MAKE SURE YOU:  Understand these instructions.  Will watch your condition.  Will get help right away if you are not doing well or get worse.   This information is not intended to replace advice given to you by your health care provider. Make sure you discuss any questions you have with your health care provider.   Document Released: 05/12/2005 Document Revised: 08/23/2014 Document Reviewed: 03/29/2013 Elsevier Interactive Patient Education 2016 [employer]  

## 2015-08-25 NOTE — ED Notes (Signed)
Pt states he does not have a portable oxygen tank but goes without it periodically and will be just fine for the ride home.  He understands to go straight home and get back on his oxygen.  I conferred with the charge nurse who agrees with this plan.  He is still refusing EMS transport.  I watched him get into a vehicle with his neighbors who confirmed power is back on at his residence.  Patient wheeled out.

## 2015-12-06 ENCOUNTER — Encounter: Payer: Self-pay | Admitting: Emergency Medicine

## 2015-12-06 DIAGNOSIS — E119 Type 2 diabetes mellitus without complications: Secondary | ICD-10-CM | POA: Insufficient documentation

## 2015-12-06 DIAGNOSIS — F17210 Nicotine dependence, cigarettes, uncomplicated: Secondary | ICD-10-CM | POA: Diagnosis not present

## 2015-12-06 DIAGNOSIS — Y9389 Activity, other specified: Secondary | ICD-10-CM | POA: Diagnosis not present

## 2015-12-06 DIAGNOSIS — X088XXA Exposure to other specified smoke, fire and flames, initial encounter: Secondary | ICD-10-CM | POA: Insufficient documentation

## 2015-12-06 DIAGNOSIS — Z7984 Long term (current) use of oral hypoglycemic drugs: Secondary | ICD-10-CM | POA: Diagnosis not present

## 2015-12-06 DIAGNOSIS — Z79899 Other long term (current) drug therapy: Secondary | ICD-10-CM | POA: Insufficient documentation

## 2015-12-06 DIAGNOSIS — I509 Heart failure, unspecified: Secondary | ICD-10-CM | POA: Insufficient documentation

## 2015-12-06 DIAGNOSIS — Y929 Unspecified place or not applicable: Secondary | ICD-10-CM | POA: Diagnosis not present

## 2015-12-06 DIAGNOSIS — Z7982 Long term (current) use of aspirin: Secondary | ICD-10-CM | POA: Insufficient documentation

## 2015-12-06 DIAGNOSIS — J449 Chronic obstructive pulmonary disease, unspecified: Secondary | ICD-10-CM | POA: Insufficient documentation

## 2015-12-06 DIAGNOSIS — T23211A Burn of second degree of right thumb (nail), initial encounter: Secondary | ICD-10-CM | POA: Diagnosis not present

## 2015-12-06 DIAGNOSIS — Y999 Unspecified external cause status: Secondary | ICD-10-CM | POA: Diagnosis not present

## 2015-12-06 DIAGNOSIS — I110 Hypertensive heart disease with heart failure: Secondary | ICD-10-CM | POA: Diagnosis not present

## 2015-12-06 DIAGNOSIS — T2029XA Burn of second degree of multiple sites of head, face, and neck, initial encounter: Secondary | ICD-10-CM | POA: Diagnosis present

## 2015-12-06 DIAGNOSIS — T23242A Burn of second degree of multiple left fingers (nail), including thumb, initial encounter: Secondary | ICD-10-CM | POA: Insufficient documentation

## 2015-12-06 DIAGNOSIS — I252 Old myocardial infarction: Secondary | ICD-10-CM | POA: Diagnosis not present

## 2015-12-06 MED ORDER — OXYCODONE-ACETAMINOPHEN 5-325 MG PO TABS
ORAL_TABLET | ORAL | Status: AC
  Administered 2015-12-06: 1 via ORAL
  Filled 2015-12-06: qty 1

## 2015-12-06 MED ORDER — OXYCODONE-ACETAMINOPHEN 5-325 MG PO TABS
1.0000 | ORAL_TABLET | Freq: Once | ORAL | Status: AC
  Administered 2015-12-06: 1 via ORAL
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
## 2015-12-07 ENCOUNTER — Emergency Department
Admission: EM | Admit: 2015-12-07 | Discharge: 2015-12-07 | Disposition: A | Discharge status: Other Acute Inpatient Hospital | Payer: Medicaid Other | Attending: Emergency Medicine | Admitting: Emergency Medicine

## 2015-12-07 ENCOUNTER — Ambulatory Visit (HOSPITAL_COMMUNITY)
Admission: AD | Admit: 2015-12-07 | Discharge: 2015-12-07 | Disposition: A | Discharge status: Home / Self Care | Payer: Medicaid Other | Source: Other Acute Inpatient Hospital | Attending: Emergency Medicine | Admitting: Emergency Medicine

## 2015-12-07 DIAGNOSIS — T2000XA Burn of unspecified degree of head, face, and neck, unspecified site, initial encounter: Secondary | ICD-10-CM | POA: Insufficient documentation

## 2015-12-07 DIAGNOSIS — X088XXA Exposure to other specified smoke, fire and flames, initial encounter: Secondary | ICD-10-CM | POA: Insufficient documentation

## 2015-12-07 DIAGNOSIS — T2020XA Burn of second degree of head, face, and neck, unspecified site, initial encounter: Secondary | ICD-10-CM

## 2015-12-07 DIAGNOSIS — T23231A Burn of second degree of multiple right fingers (nail), not including thumb, initial encounter: Secondary | ICD-10-CM

## 2015-12-07 DIAGNOSIS — T23202A Burn of second degree of left hand, unspecified site, initial encounter: Secondary | ICD-10-CM

## 2015-12-07 DIAGNOSIS — J449 Chronic obstructive pulmonary disease, unspecified: Secondary | ICD-10-CM

## 2015-12-07 DIAGNOSIS — T23201A Burn of second degree of right hand, unspecified site, initial encounter: Secondary | ICD-10-CM

## 2015-12-07 DIAGNOSIS — T23232A Burn of second degree of multiple left fingers (nail), not including thumb, initial encounter: Secondary | ICD-10-CM

## 2015-12-07 LAB — BLOOD GAS, ARTERIAL
ACID-BASE EXCESS: 0.5 mmol/L (ref 0.0–3.0)
BICARBONATE: 25.4 meq/L (ref 21.0–28.0)
FIO2: 0.28
O2 SAT: 96.4 %
PCO2 ART: 41 mmHg (ref 32.0–48.0)
PO2 ART: 85 mmHg (ref 83.0–108.0)
Patient temperature: 37
pH, Arterial: 7.4 (ref 7.350–7.450)

## 2015-12-07 MED ORDER — MORPHINE SULFATE (PF) 2 MG/ML IV SOLN
2.0000 mg | Freq: Once | INTRAVENOUS | Status: AC
  Administered 2015-12-07: 2 mg via INTRAVENOUS
  Filled 2015-12-07: qty 1

## 2015-12-07 MED ORDER — MORPHINE SULFATE (PF) 2 MG/ML IV SOLN
INTRAVENOUS | Status: AC
  Administered 2015-12-07: 2 mg via INTRAVENOUS
  Filled 2015-12-07: qty 1

## 2015-12-07 MED ORDER — SODIUM CHLORIDE 0.9 % IV BOLUS (SEPSIS)
500.0000 mL | INTRAVENOUS | Status: AC
  Administered 2015-12-07: 500 mL via INTRAVENOUS

## 2015-12-07 MED ORDER — MORPHINE SULFATE (PF) 2 MG/ML IV SOLN
2.0000 mg | Freq: Once | INTRAVENOUS | Status: AC
  Administered 2015-12-07: 2 mg via INTRAVENOUS

## 2015-12-07 NOTE — ED Notes (Signed)
Report given to [facility], ETA 10 minutes

## 2015-12-07 NOTE — ED Notes (Signed)
Pt reports improved pain, talking on phone at this time.

## 2015-12-07 NOTE — ED Notes (Signed)
Report from April, RN  

## 2015-12-07 NOTE — ED Notes (Signed)
Report to angela, rn. 

## 2015-12-07 NOTE — ED Notes (Signed)
Pt's family daniel Kysar reachable at [phone].
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
## 2015-12-07 NOTE — ED Notes (Signed)
Pt updated on transport process.  
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
## 2016-05-09 ENCOUNTER — Encounter: Payer: Self-pay | Admitting: Emergency Medicine

## 2016-05-09 ENCOUNTER — Emergency Department: Payer: Medicaid Other

## 2016-05-09 ENCOUNTER — Emergency Department
Admission: EM | Admit: 2016-05-09 | Discharge: 2016-05-10 | Disposition: A | Discharge status: Home / Self Care | Payer: Medicaid Other | Attending: Emergency Medicine | Admitting: Emergency Medicine

## 2016-05-09 DIAGNOSIS — Z7984 Long term (current) use of oral hypoglycemic drugs: Secondary | ICD-10-CM | POA: Diagnosis not present

## 2016-05-09 DIAGNOSIS — E119 Type 2 diabetes mellitus without complications: Secondary | ICD-10-CM | POA: Insufficient documentation

## 2016-05-09 DIAGNOSIS — Z9861 Coronary angioplasty status: Secondary | ICD-10-CM | POA: Diagnosis not present

## 2016-05-09 DIAGNOSIS — F17210 Nicotine dependence, cigarettes, uncomplicated: Secondary | ICD-10-CM | POA: Diagnosis not present

## 2016-05-09 DIAGNOSIS — R200 Anesthesia of skin: Secondary | ICD-10-CM | POA: Diagnosis present

## 2016-05-09 DIAGNOSIS — E871 Hypo-osmolality and hyponatremia: Secondary | ICD-10-CM | POA: Insufficient documentation

## 2016-05-09 DIAGNOSIS — I110 Hypertensive heart disease with heart failure: Secondary | ICD-10-CM | POA: Insufficient documentation

## 2016-05-09 DIAGNOSIS — Z7982 Long term (current) use of aspirin: Secondary | ICD-10-CM | POA: Diagnosis not present

## 2016-05-09 DIAGNOSIS — J449 Chronic obstructive pulmonary disease, unspecified: Secondary | ICD-10-CM | POA: Insufficient documentation

## 2016-05-09 DIAGNOSIS — Z79899 Other long term (current) drug therapy: Secondary | ICD-10-CM | POA: Insufficient documentation

## 2016-05-09 DIAGNOSIS — I509 Heart failure, unspecified: Secondary | ICD-10-CM | POA: Diagnosis not present

## 2016-05-09 LAB — CBC WITH DIFFERENTIAL/PLATELET
BASOS ABS: 0.1 10*3/uL (ref 0–0.1)
BASOS PCT: 1 %
Eosinophils Absolute: 0.3 10*3/uL (ref 0–0.7)
Eosinophils Relative: 2 %
HEMATOCRIT: 44.4 % (ref 40.0–52.0)
HEMOGLOBIN: 14.9 g/dL (ref 13.0–18.0)
LYMPHS PCT: 26 %
Lymphs Abs: 3.9 10*3/uL — ABNORMAL HIGH (ref 1.0–3.6)
MCH: 29.1 pg (ref 26.0–34.0)
MCHC: 33.5 g/dL (ref 32.0–36.0)
MCV: 86.7 fL (ref 80.0–100.0)
MONOS PCT: 8 %
Monocytes Absolute: 1.1 10*3/uL — ABNORMAL HIGH (ref 0.2–1.0)
NEUTROS ABS: 9.4 10*3/uL — AB (ref 1.4–6.5)
NEUTROS PCT: 63 %
Platelets: 301 10*3/uL (ref 150–440)
RBC: 5.12 MIL/uL (ref 4.40–5.90)
RDW: 15.1 % — ABNORMAL HIGH (ref 11.5–14.5)
WBC: 14.8 10*3/uL — ABNORMAL HIGH (ref 3.8–10.6)

## 2016-05-09 LAB — BASIC METABOLIC PANEL
ANION GAP: 9 (ref 5–15)
BUN: 9 mg/dL (ref 6–20)
CHLORIDE: 94 mmol/L — AB (ref 101–111)
CO2: 24 mmol/L (ref 22–32)
Calcium: 8.9 mg/dL (ref 8.9–10.3)
Creatinine, Ser: 0.8 mg/dL (ref 0.61–1.24)
GFR calc non Af Amer: >60 mL/min (ref >60)
Glucose, Bld: 158 mg/dL — ABNORMAL HIGH (ref 65–99)
Potassium: 3.2 mmol/L — ABNORMAL LOW (ref 3.5–5.1)
Sodium: 127 mmol/L — ABNORMAL LOW (ref 135–145)

## 2016-05-09 LAB — TROPONIN I: Troponin I: <0.03 ng/mL (ref <0.03)

## 2016-05-09 NOTE — ED Notes (Signed)
Patient transported to CT 

## 2016-05-09 NOTE — Discharge Instructions (Signed)
Please seek medical attention for any high fevers, chest pain, shortness of breath, change in behavior, persistent vomiting, bloody stool or any other new or concerning symptoms.  

## 2016-05-09 NOTE — ED Triage Notes (Signed)
Patient brought in by ems from home. Patient with complaint of right arm and tingling that started around 9:00am. 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 Patient with Lidoderm patch to right arm.

## 2016-05-09 NOTE — ED Notes (Signed)
Fall bracelet placed on patient. Denies need for anything at this time.

## 2016-05-09 NOTE — ED Provider Notes (Signed)
[facility] Emergency Department Provider Note   ____________________________________________   I have reviewed the triage vital signs and the nursing notes.   HISTORY  Chief Complaint Arm Pain   History limited by: Not Limited   HPI Kristopher Powell is a 60 y.o. male who presents to the emergency department today because of concern for right sided numbness. The patient states that he notes it when he woke up. He states it was his right leg, right arm and right jaw that were numb. He also felt weak. At any concurrent headache. No chest pain or shortness breath with this. He states that he has a history of nerve damage in his right forearm but typically no numbness. He denies any recent fevers or illness.   Past Medical History:  Diagnosis Date  . CHF (congestive heart failure) (HCC)   . COPD (chronic obstructive pulmonary disease) (HCC)   . Diabetes mellitus without complication (HCC)   . Hypertension   . MI (myocardial infarction) [facility](HCC) 2006    Patient Active Problem List   Diagnosis Date Noted  . Generalized anxiety disorder 08/14/2015  . Benzodiazepine abuse 08/14/2015  . Acute delirium 08/14/2015  . Diabetes (HCC) 08/14/2015  . COPD (chronic obstructive pulmonary disease) (HCC) 08/14/2015  . Suicidal ideation 08/14/2015  . Hyponatremia 05/23/2015  . Hypokalemia 05/23/2015    Past Surgical History:  Procedure Laterality Date  . APPENDECTOMY    . CHOLECYSTECTOMY    . CORONARY ANGIOPLASTY WITH STENT PLACEMENT      Prior to Admission medications   Medication Sig Start Date End Date Taking? Authorizing Provider  albuterol (PROVENTIL HFA;VENTOLIN HFA) 108 (90 Base) MCG/ACT inhaler Inhale 2 puffs into the lungs every 6 (six) hours as needed for wheezing or shortness of breath.    Historical Provider, MD  amLODipine (NORVASC) 5 MG tablet Take 5 mg by mouth daily.    Historical Provider, MD  aspirin EC 325 MG tablet Take 325 mg by mouth daily.     Historical Provider, MD  diazepam (VALIUM) 10 MG tablet Take 5-10 mg by mouth 3 (three) times daily as needed for anxiety.     Historical Provider, MD  esomeprazole (NEXIUM) 40 MG capsule Take 40 mg by mouth daily.    Historical Provider, MD  gabapentin (NEURONTIN) 300 MG capsule Take 1,200 mg by mouth 3 (three) times daily.     Historical Provider, MD  metFORMIN (GLUCOPHAGE-XR) 500 MG 24 hr tablet Take 500 mg by mouth 2 (two) times daily. 09/05/15   Historical Provider, MD  metoprolol succinate (TOPROL XL) 25 MG 24 hr tablet Take 25 mg by mouth daily.    Historical Provider, MD    Allergies Review of patient's allergies indicates no known allergies.  Family History  Problem Relation Age of Onset  . CAD Mother   . Stroke Father     Social History Social History  Substance Use Topics  . Smoking status: Current Every Day Smoker    Packs/day: 1.00    Years: 40.00    Types: Cigarettes  . Smokeless tobacco: Never Used  . Alcohol use Yes     Comment: occ    Review of Systems  Constitutional: Negative for fever. Cardiovascular: Negative for chest pain. Respiratory: Negative for shortness of breath. Gastrointestinal: Negative for abdominal pain, vomiting and diarrhea. Genitourinary: Negative for dysuria. Neurological: Right-sided numbness  10-point ROS otherwise negative.  ____________________________________________   PHYSICAL EXAM:  VITAL SIGNS: ED Triage Vitals  Enc Vitals Group  BP 05/09/16 2006 (!) 146/87     Pulse Rate 05/09/16 2006 (!) 113     Resp 05/09/16 2006 20     Temp 05/09/16 2006 98.1 F (36.7 C)     Temp Source 05/09/16 2006 Oral     SpO2 05/09/16 2006 95 %     Weight 05/09/16 2004 212 lb (96.2 kg)     Height 05/09/16 2004 6' (1.829 m)     Head Circumference --      Peak Flow --      Pain Score 05/09/16 2004 6   Constitutional: Alert and oriented. Well appearing and in no distress. Eyes: Conjunctivae are normal. Normal extraocular  movements. ENT   Head: Normocephalic and atraumatic.   Nose: No congestion/rhinnorhea.   Mouth/Throat: Mucous membranes are moist.   Neck: No stridor. Hematological/Lymphatic/Immunilogical: No cervical lymphadenopathy. Cardiovascular: Normal rate, regular rhythm.  No murmurs, rubs, or gallops. Respiratory: Normal respiratory effort without tachypnea nor retractions. Breath sounds are clear and equal bilaterally. No wheezes/rales/rhonchi. Gastrointestinal: Soft and nontender. No distention.  Genitourinary: Deferred Musculoskeletal: Normal range of motion in all extremities. No lower extremity edema. Neurologic:  Normal speech and language. No facial asymmetry. Tongue midline. No upper or lower extremity drift. Sensation grossly intact. No gross focal neurologic deficits are appreciated.  Skin:  Skin is warm, dry and intact. No rash noted. Psychiatric: Mood and affect are normal. Speech and behavior are normal. Patient exhibits appropriate insight and judgment.  ____________________________________________    LABS (pertinent positives/negatives)  Labs Reviewed  CBC WITH DIFFERENTIAL/PLATELET - Abnormal; Notable for the following:       Result Value   WBC 14.8 (*)    RDW 15.1 (*)    Neutro Abs 9.4 (*)    Lymphs Abs 3.9 (*)    Monocytes Absolute 1.1 (*)    All other components within normal limits  BASIC METABOLIC PANEL - Abnormal; Notable for the following:    Sodium 127 (*)    Potassium 3.2 (*)    Chloride 94 (*)    Glucose, Bld 158 (*)    All other components within normal limits  TROPONIN I     ____________________________________________   EKG  I, Phineas Semen, attending physician, personally viewed and interpreted this EKG  EKG Time: 2008 Rate: 115 Rhythm: sinus tachycardia Axis: left axis deviation Intervals: qtc 475 QRS: narrow, q waves V1, V2, V3 ST changes: no st elevation Impression: abnormal  ekg   ____________________________________________    RADIOLOGY  CT head IMPRESSION:  1. No acute intracranial abnormality.  2. Stable non contrast CT appearance of the brain with chronic  insults to the bilateral globus pallidus and mild for age  superimposed nonspecific white matter changes.     ____________________________________________   PROCEDURES  Procedures  ____________________________________________   INITIAL IMPRESSION / ASSESSMENT AND PLAN / ED COURSE  Pertinent labs & imaging results that were available during my care of the patient were reviewed by me and considered in my medical decision making (see chart for details).  Patient presented to the emergency department today because of concerns for right-sided numbness started this point. No focal neuro deficits on exam. Head CT without any acute findings. Patient's sodium level was slightly decreased. Think this might be playing a part in the patient's problem. I did offer and recommend admission to the hospital for hyponatremia and further workup of the numbness. However the patient declined admission stating that he had a dog to take care of at home. I  did encourage the patient to return for any worsening symptoms. Also encourage patient follow-up with primary care. ____________________________________________   FINAL CLINICAL IMPRESSION(S) / ED DIAGNOSES  Final diagnoses:  Hyponatremia  Numbness     Note: This dictation was prepared with Dragon dictation. Any transcriptional errors that result from this process are unintentional    Phineas Semen, MD 05/10/16 0006

## 2016-05-10 NOTE — ED Notes (Signed)
Reviewed d/c instructions and follow-up care with pt. Pt verbalized understanding. Took pt out to lobby to wait for cab

## 2016-07-30 ENCOUNTER — Encounter: Payer: Self-pay | Admitting: Emergency Medicine

## 2016-07-30 ENCOUNTER — Emergency Department
Admission: EM | Admit: 2016-07-30 | Discharge: 2016-07-31 | Disposition: A | Discharge status: Home / Self Care | Payer: Medicaid Other | Attending: Emergency Medicine | Admitting: Emergency Medicine

## 2016-07-30 ENCOUNTER — Emergency Department: Payer: Medicaid Other

## 2016-07-30 DIAGNOSIS — Y929 Unspecified place or not applicable: Secondary | ICD-10-CM | POA: Insufficient documentation

## 2016-07-30 DIAGNOSIS — Z7982 Long term (current) use of aspirin: Secondary | ICD-10-CM | POA: Insufficient documentation

## 2016-07-30 DIAGNOSIS — Z7984 Long term (current) use of oral hypoglycemic drugs: Secondary | ICD-10-CM | POA: Diagnosis not present

## 2016-07-30 DIAGNOSIS — F17210 Nicotine dependence, cigarettes, uncomplicated: Secondary | ICD-10-CM | POA: Insufficient documentation

## 2016-07-30 DIAGNOSIS — S0181XA Laceration without foreign body of other part of head, initial encounter: Secondary | ICD-10-CM

## 2016-07-30 DIAGNOSIS — E119 Type 2 diabetes mellitus without complications: Secondary | ICD-10-CM | POA: Diagnosis not present

## 2016-07-30 DIAGNOSIS — S022XXA Fracture of nasal bones, initial encounter for closed fracture: Secondary | ICD-10-CM | POA: Insufficient documentation

## 2016-07-30 DIAGNOSIS — I110 Hypertensive heart disease with heart failure: Secondary | ICD-10-CM | POA: Diagnosis not present

## 2016-07-30 DIAGNOSIS — S0531XA Ocular laceration without prolapse or loss of intraocular tissue, right eye, initial encounter: Secondary | ICD-10-CM | POA: Diagnosis not present

## 2016-07-30 DIAGNOSIS — Y999 Unspecified external cause status: Secondary | ICD-10-CM | POA: Insufficient documentation

## 2016-07-30 DIAGNOSIS — S0083XA Contusion of other part of head, initial encounter: Secondary | ICD-10-CM

## 2016-07-30 DIAGNOSIS — I509 Heart failure, unspecified: Secondary | ICD-10-CM | POA: Insufficient documentation

## 2016-07-30 DIAGNOSIS — S0990XA Unspecified injury of head, initial encounter: Secondary | ICD-10-CM | POA: Insufficient documentation

## 2016-07-30 DIAGNOSIS — Z79899 Other long term (current) drug therapy: Secondary | ICD-10-CM | POA: Diagnosis not present

## 2016-07-30 DIAGNOSIS — R0789 Other chest pain: Secondary | ICD-10-CM | POA: Insufficient documentation

## 2016-07-30 DIAGNOSIS — H05231 Hemorrhage of right orbit: Secondary | ICD-10-CM

## 2016-07-30 DIAGNOSIS — J449 Chronic obstructive pulmonary disease, unspecified: Secondary | ICD-10-CM | POA: Diagnosis not present

## 2016-07-30 DIAGNOSIS — Y9389 Activity, other specified: Secondary | ICD-10-CM | POA: Insufficient documentation

## 2016-07-30 DIAGNOSIS — I252 Old myocardial infarction: Secondary | ICD-10-CM | POA: Diagnosis not present

## 2016-07-30 DIAGNOSIS — S0992XA Unspecified injury of nose, initial encounter: Secondary | ICD-10-CM | POA: Diagnosis present
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
## 2016-07-31 NOTE — ED Notes (Signed)
Pt walked from bed to wheelchair with no assistance, no distress noted. Pt verbalizes understanding of discharge instructions and the importance to follow up with PCP after today's visit to ER.

## 2016-07-31 NOTE — ED Notes (Signed)
Pt signed e-signature did not showed on screen

## 2016-08-06 ENCOUNTER — Observation Stay
Admission: EM | Admit: 2016-08-06 | Discharge: 2016-08-07 | Disposition: A | Discharge status: Home / Self Care | Payer: Medicaid Other | Attending: Internal Medicine | Admitting: Internal Medicine

## 2016-08-06 ENCOUNTER — Emergency Department: Payer: Medicaid Other

## 2016-08-06 DIAGNOSIS — I252 Old myocardial infarction: Secondary | ICD-10-CM | POA: Diagnosis not present

## 2016-08-06 DIAGNOSIS — Z23 Encounter for immunization: Secondary | ICD-10-CM | POA: Diagnosis not present

## 2016-08-06 DIAGNOSIS — E876 Hypokalemia: Secondary | ICD-10-CM | POA: Diagnosis not present

## 2016-08-06 DIAGNOSIS — Z7984 Long term (current) use of oral hypoglycemic drugs: Secondary | ICD-10-CM | POA: Diagnosis not present

## 2016-08-06 DIAGNOSIS — Z955 Presence of coronary angioplasty implant and graft: Secondary | ICD-10-CM | POA: Insufficient documentation

## 2016-08-06 DIAGNOSIS — F17210 Nicotine dependence, cigarettes, uncomplicated: Secondary | ICD-10-CM | POA: Diagnosis not present

## 2016-08-06 DIAGNOSIS — I10 Essential (primary) hypertension: Secondary | ICD-10-CM | POA: Insufficient documentation

## 2016-08-06 DIAGNOSIS — R079 Chest pain, unspecified: Secondary | ICD-10-CM | POA: Diagnosis not present

## 2016-08-06 DIAGNOSIS — Z9981 Dependence on supplemental oxygen: Secondary | ICD-10-CM | POA: Insufficient documentation

## 2016-08-06 DIAGNOSIS — R531 Weakness: Secondary | ICD-10-CM | POA: Diagnosis present

## 2016-08-06 DIAGNOSIS — E119 Type 2 diabetes mellitus without complications: Secondary | ICD-10-CM | POA: Insufficient documentation

## 2016-08-06 DIAGNOSIS — I2510 Atherosclerotic heart disease of native coronary artery without angina pectoris: Secondary | ICD-10-CM | POA: Diagnosis not present

## 2016-08-06 DIAGNOSIS — J449 Chronic obstructive pulmonary disease, unspecified: Secondary | ICD-10-CM | POA: Diagnosis not present

## 2016-08-06 DIAGNOSIS — Z7982 Long term (current) use of aspirin: Secondary | ICD-10-CM | POA: Insufficient documentation

## 2016-08-06 DIAGNOSIS — E860 Dehydration: Secondary | ICD-10-CM | POA: Diagnosis present

## 2016-08-06 DIAGNOSIS — F10129 Alcohol abuse with intoxication, unspecified: Secondary | ICD-10-CM | POA: Diagnosis not present

## 2016-08-06 DIAGNOSIS — I509 Heart failure, unspecified: Secondary | ICD-10-CM | POA: Insufficient documentation

## 2016-08-06 DIAGNOSIS — R112 Nausea with vomiting, unspecified: Secondary | ICD-10-CM | POA: Insufficient documentation

## 2016-08-06 DIAGNOSIS — E871 Hypo-osmolality and hyponatremia: Principal | ICD-10-CM | POA: Insufficient documentation

## 2016-08-06 DIAGNOSIS — Z79899 Other long term (current) drug therapy: Secondary | ICD-10-CM | POA: Diagnosis not present

## 2016-08-06 DIAGNOSIS — M6281 Muscle weakness (generalized): Secondary | ICD-10-CM

## 2016-08-06 LAB — URINE DRUG SCREEN, QUALITATIVE (ARMC ONLY)
Amphetamines, Ur Screen: NOT DETECTED
Barbiturates, Ur Screen: NOT DETECTED
Benzodiazepine, Ur Scrn: POSITIVE — AB
CANNABINOID 50 NG, UR ~~LOC~~: NOT DETECTED
COCAINE METABOLITE, UR ~~LOC~~: NOT DETECTED
MDMA (Ecstasy)Ur Screen: NOT DETECTED
Methadone Scn, Ur: NOT DETECTED
OPIATE, UR SCREEN: NOT DETECTED
PHENCYCLIDINE (PCP) UR S: NOT DETECTED
TRICYCLIC, UR SCREEN: POSITIVE — AB

## 2016-08-06 LAB — COMPREHENSIVE METABOLIC PANEL
ALBUMIN: 3.7 g/dL (ref 3.5–5.0)
ALT: 12 U/L — AB (ref 17–63)
AST: 22 U/L (ref 15–41)
Alkaline Phosphatase: 109 U/L (ref 38–126)
Anion gap: 13 (ref 5–15)
BUN: 7 mg/dL (ref 6–20)
CHLORIDE: 89 mmol/L — AB (ref 101–111)
CO2: 21 mmol/L — AB (ref 22–32)
CREATININE: 0.54 mg/dL — AB (ref 0.61–1.24)
Calcium: 8.2 mg/dL — ABNORMAL LOW (ref 8.9–10.3)
GFR calc Af Amer: >60 mL/min (ref >60)
GFR calc non Af Amer: >60 mL/min (ref >60)
Glucose, Bld: 82 mg/dL (ref 65–99)
POTASSIUM: 3.2 mmol/L — AB (ref 3.5–5.1)
SODIUM: 123 mmol/L — AB (ref 135–145)
Total Bilirubin: 0.4 mg/dL (ref 0.3–1.2)
Total Protein: 6.7 g/dL (ref 6.5–8.1)

## 2016-08-06 LAB — URINALYSIS, ROUTINE W REFLEX MICROSCOPIC
BILIRUBIN URINE: NEGATIVE
GLUCOSE, UA: NEGATIVE mg/dL
HGB URINE DIPSTICK: NEGATIVE
KETONES UR: NEGATIVE mg/dL
Leukocytes, UA: NEGATIVE
Nitrite: NEGATIVE
Protein, ur: NEGATIVE mg/dL
Specific Gravity, Urine: 1 — ABNORMAL LOW (ref 1.005–1.030)
pH: 7 (ref 5.0–8.0)

## 2016-08-06 LAB — CBC WITH DIFFERENTIAL/PLATELET
BASOS ABS: 0.1 10*3/uL (ref 0–0.1)
BASOS PCT: 0 %
EOS ABS: 0 10*3/uL (ref 0–0.7)
EOS PCT: 0 %
HCT: 36.2 % — ABNORMAL LOW (ref 40.0–52.0)
Hemoglobin: 12.3 g/dL — ABNORMAL LOW (ref 13.0–18.0)
LYMPHS PCT: 15 %
Lymphs Abs: 2.3 10*3/uL (ref 1.0–3.6)
MCH: 29.3 pg (ref 26.0–34.0)
MCHC: 34 g/dL (ref 32.0–36.0)
MCV: 86.3 fL (ref 80.0–100.0)
MONO ABS: 1 10*3/uL (ref 0.2–1.0)
Monocytes Relative: 6 %
Neutro Abs: 12.4 10*3/uL — ABNORMAL HIGH (ref 1.4–6.5)
Neutrophils Relative %: 79 %
PLATELETS: 354 10*3/uL (ref 150–440)
RBC: 4.2 MIL/uL — AB (ref 4.40–5.90)
RDW: 15.1 % — AB (ref 11.5–14.5)
WBC: 15.8 10*3/uL — AB (ref 3.8–10.6)

## 2016-08-06 LAB — ETHANOL: ALCOHOL ETHYL (B): 30 mg/dL — AB (ref <5)

## 2016-08-06 LAB — TROPONIN I

## 2016-08-06 LAB — GLUCOSE, CAPILLARY: Glucose-Capillary: 78 mg/dL (ref 65–99)

## 2016-08-06 LAB — SODIUM: Sodium: 122 mmol/L — ABNORMAL LOW (ref 135–145)

## 2016-08-06 LAB — MAGNESIUM: Magnesium: 1.8 mg/dL (ref 1.7–2.4)

## 2016-08-06 MED ORDER — SODIUM CHLORIDE 0.9 % IV BOLUS (SEPSIS)
1000.0000 mL | Freq: Once | INTRAVENOUS | Status: AC
  Administered 2016-08-06: 1000 mL via INTRAVENOUS

## 2016-08-06 MED ORDER — VITAMIN B-1 100 MG PO TABS
100.0000 mg | ORAL_TABLET | Freq: Every day | ORAL | Status: DC
  Administered 2016-08-07: 100 mg via ORAL
  Filled 2016-08-06: qty 1

## 2016-08-06 MED ORDER — ACETAMINOPHEN 650 MG RE SUPP: 650.0000 mg | Freq: Four times a day (QID) | RECTAL | Status: DC | PRN

## 2016-08-06 MED ORDER — ENOXAPARIN SODIUM 40 MG/0.4ML ~~LOC~~ SOLN
40.0000 mg | Freq: Every day | SUBCUTANEOUS | Status: DC
  Administered 2016-08-07: 40 mg via SUBCUTANEOUS
  Filled 2016-08-06: qty 0.4

## 2016-08-06 MED ORDER — SODIUM CHLORIDE 0.9 % IV SOLN
INTRAVENOUS | Status: DC
  Administered 2016-08-06: via INTRAVENOUS

## 2016-08-06 MED ORDER — INSULIN ASPART 100 UNIT/ML ~~LOC~~ SOLN: 0.0000 [IU] | Freq: Three times a day (TID) | SUBCUTANEOUS | Status: DC

## 2016-08-06 MED ORDER — ADULT MULTIVITAMIN W/MINERALS CH
1.0000 | ORAL_TABLET | Freq: Every day | ORAL | Status: DC
  Administered 2016-08-07: 1 via ORAL
  Filled 2016-08-06: qty 1

## 2016-08-06 MED ORDER — ONDANSETRON HCL 4 MG/2ML IJ SOLN
4.0000 mg | Freq: Once | INTRAMUSCULAR | Status: AC
  Administered 2016-08-06: 4 mg via INTRAVENOUS
  Filled 2016-08-06: qty 2

## 2016-08-06 MED ORDER — ALBUTEROL SULFATE (2.5 MG/3ML) 0.083% IN NEBU: 3.0000 mL | INHALATION_SOLUTION | Freq: Four times a day (QID) | RESPIRATORY_TRACT | Status: DC | PRN

## 2016-08-06 MED ORDER — ONDANSETRON HCL 4 MG PO TABS: 4.0000 mg | ORAL_TABLET | Freq: Four times a day (QID) | ORAL | Status: DC | PRN

## 2016-08-06 MED ORDER — FOLIC ACID 1 MG PO TABS
1.0000 mg | ORAL_TABLET | Freq: Every day | ORAL | Status: DC
  Administered 2016-08-07: 1 mg via ORAL
  Filled 2016-08-06: qty 1

## 2016-08-06 MED ORDER — MAGNESIUM CITRATE PO SOLN: 1.0000 | Freq: Once | ORAL | Status: DC | PRN

## 2016-08-06 MED ORDER — METOPROLOL SUCCINATE ER 25 MG PO TB24
25.0000 mg | ORAL_TABLET | Freq: Every day | ORAL | Status: DC
  Administered 2016-08-07: 25 mg via ORAL
  Filled 2016-08-06: qty 1

## 2016-08-06 MED ORDER — ALBUTEROL SULFATE (2.5 MG/3ML) 0.083% IN NEBU: 2.5000 mg | INHALATION_SOLUTION | Freq: Four times a day (QID) | RESPIRATORY_TRACT | Status: DC | PRN

## 2016-08-06 MED ORDER — SENNOSIDES-DOCUSATE SODIUM 8.6-50 MG PO TABS
1.0000 | ORAL_TABLET | Freq: Every evening | ORAL | Status: DC | PRN
Start: 2016-08-06 — End: 2016-08-07

## 2016-08-06 MED ORDER — SODIUM CHLORIDE 0.9% FLUSH
3.0000 mL | Freq: Two times a day (BID) | INTRAVENOUS | Status: DC
  Administered 2016-08-06: 3 mL via INTRAVENOUS

## 2016-08-06 MED ORDER — BISACODYL 5 MG PO TBEC: 5.0000 mg | DELAYED_RELEASE_TABLET | Freq: Every day | ORAL | Status: DC | PRN

## 2016-08-06 MED ORDER — POTASSIUM CHLORIDE CRYS ER 20 MEQ PO TBCR
40.0000 meq | EXTENDED_RELEASE_TABLET | Freq: Two times a day (BID) | ORAL | Status: AC
Start: 2016-08-06 — End: 2016-08-07
  Administered 2016-08-07 (×2): 40 meq via ORAL
  Filled 2016-08-06 (×2): qty 2

## 2016-08-06 MED ORDER — LORAZEPAM 1 MG PO TABS
1.0000 mg | ORAL_TABLET | Freq: Four times a day (QID) | ORAL | Status: DC | PRN
  Administered 2016-08-07 (×2): 1 mg via ORAL
  Filled 2016-08-06 (×2): qty 1

## 2016-08-06 MED ORDER — LORAZEPAM 2 MG/ML IJ SOLN
1.0000 mg | Freq: Once | INTRAMUSCULAR | Status: AC
  Administered 2016-08-06: 1 mg via INTRAVENOUS
  Filled 2016-08-06: qty 1

## 2016-08-06 MED ORDER — AMLODIPINE BESYLATE 5 MG PO TABS
5.0000 mg | ORAL_TABLET | Freq: Every day | ORAL | Status: DC
  Administered 2016-08-07: 5 mg via ORAL
  Filled 2016-08-06: qty 1

## 2016-08-06 MED ORDER — INSULIN ASPART 100 UNIT/ML ~~LOC~~ SOLN: 0.0000 [IU] | Freq: Every day | SUBCUTANEOUS | Status: DC

## 2016-08-06 MED ORDER — INFLUENZA VAC SPLIT QUAD 0.5 ML IM SUSY
0.5000 mL | PREFILLED_SYRINGE | INTRAMUSCULAR | Status: AC
  Administered 2016-08-07: 0.5 mL via INTRAMUSCULAR
  Filled 2016-08-06: qty 0.5

## 2016-08-06 MED ORDER — ACETAMINOPHEN 325 MG PO TABS
650.0000 mg | ORAL_TABLET | Freq: Four times a day (QID) | ORAL | Status: DC | PRN
Start: 2016-08-06 — End: 2016-08-07

## 2016-08-06 MED ORDER — ONDANSETRON HCL 4 MG/2ML IJ SOLN: 4.0000 mg | Freq: Four times a day (QID) | INTRAMUSCULAR | Status: DC | PRN

## 2016-08-06 MED ORDER — LORAZEPAM 2 MG/ML IJ SOLN: 1.0000 mg | Freq: Four times a day (QID) | INTRAMUSCULAR | Status: DC | PRN

## 2016-08-06 MED ORDER — PANTOPRAZOLE SODIUM 40 MG PO TBEC
40.0000 mg | DELAYED_RELEASE_TABLET | Freq: Every day | ORAL | Status: DC
  Administered 2016-08-06 – 2016-08-07 (×2): 40 mg via ORAL
  Filled 2016-08-06 (×2): qty 1

## 2016-08-06 MED ORDER — ASPIRIN EC 325 MG PO TBEC
325.0000 mg | DELAYED_RELEASE_TABLET | Freq: Every day | ORAL | Status: DC
  Administered 2016-08-07: 325 mg via ORAL
  Filled 2016-08-06: qty 1

## 2016-08-06 MED ORDER — PANTOPRAZOLE SODIUM 40 MG PO TBEC
40.0000 mg | DELAYED_RELEASE_TABLET | Freq: Every day | ORAL | Status: DC
  Filled 2016-08-06: qty 1

## 2016-08-06 MED ORDER — GABAPENTIN 400 MG PO CAPS
1200.0000 mg | ORAL_CAPSULE | Freq: Three times a day (TID) | ORAL | Status: DC
  Administered 2016-08-07 (×2): 1200 mg via ORAL
  Filled 2016-08-06 (×2): qty 3

## 2016-08-06 NOTE — ED Notes (Signed)
EKG Completed per MD order

## 2016-08-06 NOTE — ED Notes (Signed)
Pt given meal tray and drink.
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
## 2016-08-06 NOTE — ED Notes (Signed)
Pt transported to xray 

## 2016-08-07 DIAGNOSIS — R079 Chest pain, unspecified: Secondary | ICD-10-CM | POA: Diagnosis present

## 2016-08-07 LAB — NA AND K (SODIUM & POTASSIUM), RAND UR
Potassium Urine: 4 mmol/L
Sodium, Ur: <10 mmol/L

## 2016-08-07 LAB — COMPREHENSIVE METABOLIC PANEL
ALK PHOS: 99 U/L (ref 38–126)
ALT: 10 U/L — AB (ref 17–63)
AST: 14 U/L — AB (ref 15–41)
Albumin: 3.5 g/dL (ref 3.5–5.0)
Anion gap: 5 (ref 5–15)
BUN: 7 mg/dL (ref 6–20)
CHLORIDE: 102 mmol/L (ref 101–111)
CO2: 26 mmol/L (ref 22–32)
CREATININE: 0.61 mg/dL (ref 0.61–1.24)
Calcium: 8.3 mg/dL — ABNORMAL LOW (ref 8.9–10.3)
GFR calc Af Amer: >60 mL/min (ref >60)
GFR calc non Af Amer: >60 mL/min (ref >60)
GLUCOSE: 93 mg/dL (ref 65–99)
Potassium: 3.8 mmol/L (ref 3.5–5.1)
SODIUM: 133 mmol/L — AB (ref 135–145)
Total Bilirubin: 0.5 mg/dL (ref 0.3–1.2)
Total Protein: 6.3 g/dL — ABNORMAL LOW (ref 6.5–8.1)

## 2016-08-07 LAB — LIPID PANEL
CHOL/HDL RATIO: 4.6 ratio
CHOLESTEROL: 160 mg/dL (ref 0–200)
HDL: 35 mg/dL — ABNORMAL LOW (ref >40)
LDL CALC: 100 mg/dL — AB (ref 0–99)
TRIGLYCERIDES: 127 mg/dL (ref <150)
VLDL: 25 mg/dL (ref 0–40)

## 2016-08-07 LAB — CREATININE, URINE, RANDOM

## 2016-08-07 LAB — CBC
HCT: 34.7 % — ABNORMAL LOW (ref 40.0–52.0)
HEMOGLOBIN: 11.9 g/dL — AB (ref 13.0–18.0)
MCH: 29.8 pg (ref 26.0–34.0)
MCHC: 34.2 g/dL (ref 32.0–36.0)
MCV: 87.2 fL (ref 80.0–100.0)
PLATELETS: 346 10*3/uL (ref 150–440)
RBC: 3.98 MIL/uL — ABNORMAL LOW (ref 4.40–5.90)
RDW: 15.4 % — ABNORMAL HIGH (ref 11.5–14.5)
WBC: 11.4 10*3/uL — AB (ref 3.8–10.6)

## 2016-08-07 LAB — GLUCOSE, CAPILLARY: GLUCOSE-CAPILLARY: 102 mg/dL — AB (ref 65–99)

## 2016-08-07 LAB — PROTEIN, URINE, RANDOM

## 2016-08-07 LAB — TROPONIN I
Troponin I: <0.03 ng/mL (ref <0.03)
Troponin I: <0.03 ng/mL (ref <0.03)

## 2016-08-07 LAB — PHOSPHORUS
Phosphorus: 2.1 mg/dL — ABNORMAL LOW (ref 2.5–4.6)
Phosphorus: 3.9 mg/dL (ref 2.5–4.6)

## 2016-08-07 LAB — OSMOLALITY, URINE: Osmolality, Ur: 43 mOsm/kg — ABNORMAL LOW (ref 300–900)

## 2016-08-07 LAB — MAGNESIUM: Magnesium: 1.8 mg/dL (ref 1.7–2.4)

## 2016-08-07 MED ORDER — NICOTINE 21 MG/24HR TD PT24: 21.0000 mg | MEDICATED_PATCH | Freq: Every day | TRANSDERMAL | Status: DC

## 2016-08-07 MED ORDER — K PHOS MONO-SOD PHOS DI & MONO 155-852-130 MG PO TABS
250.0000 mg | ORAL_TABLET | Freq: Two times a day (BID) | ORAL | Status: DC
  Administered 2016-08-07 (×2): 250 mg via ORAL
  Filled 2016-08-07 (×3): qty 1

## 2016-08-07 NOTE — Evaluation (Signed)
Physical Therapy Evaluation Patient Details Name: Kristopher ReeveHarmon L Powell MRN: [id number] DOB: 10/17/1955 Today's Date: 08/07/2016   History of Present Illness  60 y/o male with a few days of nausea, inability to eat who was admitted with dehydration.   Clinical Impression  Pt is able to do all in-room mobility w/o assist and though he had some fatigue with prolonged ambulation he ultimately was safe and consistent and did not feel like he needed or wanted any PT follow up on d/c.  He states that his son can help with groceries, etc if needed but ultimately does not feel too far from his baseline and hopes to go home soon.     Follow Up Recommendations No PT follow up    Equipment Recommendations       Recommendations for Other Services       Precautions / Restrictions Precautions Precautions: Fall Restrictions Weight Bearing Restrictions: No      Mobility  Bed Mobility Overal bed mobility: Independent                Transfers Overall transfer level: Independent Equipment used: None                Ambulation/Gait Ambulation/Gait assistance: Supervision Ambulation Distance (Feet): 200 Feet Assistive device: None       General Gait Details: Pt's O2 remains 94-96% t/o the entire effort on room air and though he has some feeling of fatigue ultimately did well and was safe.  He had no LOBs and consistent (though slow) cadence.   Stairs            Wheelchair Mobility    Modified Rankin (Stroke Patients Only)       Balance Overall balance assessment: No apparent balance deficits (not formally assessed)                                           Pertinent Vitals/Pain Pain Assessment:  (continues to report mild discomfort in R chest/arm)    Home Living Family/patient expects to be discharged to:: Private residence Living Arrangements: Alone Available Help at Discharge: Family (reports son is local and can assist as needed)            Home Equipment: Wheelchair - power;Wheelchair - [employer]manual;Walker - 2 wheels      Prior Function Level of Independence: Independent with assistive device(s)         Comments: Pt reports he does his own grocery shopping (uses scooter) and is able to do all he needs w/o assist     Hand Dominance        Extremity/Trunk Assessment   Upper Extremity Assessment Upper Extremity Assessment: Overall WFL for tasks assessed;Generalized weakness    Lower Extremity Assessment Lower Extremity Assessment: Overall WFL for tasks assessed       Communication   Communication: No difficulties  Cognition Arousal/Alertness: Awake/alert Behavior During Therapy: WFL for tasks assessed/performed Overall Cognitive Status: Within Functional Limits for tasks assessed                      General Comments      Exercises     Assessment/Plan    PT Assessment Patent does not need any further PT services  PT Problem List            PT Treatment Interventions      PT  Goals (Current goals can be found in the Care Plan section)  Acute Rehab PT Goals Patient Stated Goal: go home PT Goal Formulation: With patient    Frequency     Barriers to discharge        Co-evaluation               End of Session Equipment Utilized During Treatment: Gait belt Activity Tolerance: Patient limited by fatigue Patient left: in bed;with call bell/phone within reach;with nursing/sitter in room Nurse Communication: Mobility status    Functional Assessment Tool Used: clinical judgement Functional Limitation: Mobility: Walking and moving around Mobility: Walking and Moving Around Current Status ([id number]): At least 1 percent but less than 20 percent impaired, limited or restricted Mobility: Walking and Moving Around Goal Status [phone](G8979): At least 1 percent but less than 20 percent impaired, limited or restricted Mobility: Walking and Moving Around Discharge Status [phone](G8980): At least 1 percent  but less than 20 percent impaired, limited or restricted    Time: [id number] PT Time Calculation (min) (ACUTE ONLY): 23 min   Charges:   PT Evaluation $PT Eval Low Complexity: 1 Procedure     PT G Codes:   PT G-Codes **NOT FOR INPATIENT CLASS** Functional Assessment Tool Used: clinical judgement Functional Limitation: Mobility: Walking and moving around Mobility: Walking and Moving Around Current Status ([id number](G8978): At least 1 percent but less than 20 percent impaired, limited or restricted Mobility: Walking and Moving Around Goal Status [phone]): At least 1 percent but less than 20 percent impaired, limited or restricted Mobility: Walking and Moving Around Discharge Status [phone](G8980): At least 1 percent but less than 20 percent impaired, limited or restricted    Malachi ProGalen R Therin Vetsch, DPT 08/07/2016, 12:29 PM

## 2016-08-07 NOTE — Consult Note (Signed)
[facility] Cardiology Consultation Note  Patient ID: Kristopher Powell, MRN: [id number], DOB/AGE: 61-Jan-1957 61 y.o. Admit date: 08/06/2016   Date of Consult: 08/07/2016 Primary Physician: Candie Chroman, DO Primary Cardiologist:None  Chief Complaint:  Chief Complaint  Patient presents with  . Weakness   Reason for Consult: chest pain with known previous history of cardiovascular disease  HPI: 60 y.o. male with the known apparent coronary artery disease status post previous myocardial infarction of unknown timing having risk factors including diabetes with complications essential hypertension and mixed hyperlipidemia. The patient has had a recent issue with shortness of breath right sided chest discomfort radiating into his right arm and right face with an EKG showing normal sinus rhythm with lateral T-wave inversions of unknown etiology. The patient has had normal troponins at this time without evidence of myocardial infarction. The patient since is been having no evidence of chest pain or other EKG changes or telemetry changes with this and appears to have had some other infection and/or weakness and fatigue. The patient has not reported any further chest pain today. Previously the patient was on appropriate medication management for hypertension control diabetes and hyperlipidemia  Past Medical History:  Diagnosis Date  . CHF (congestive heart failure) (HCC)   . COPD (chronic obstructive pulmonary disease) (HCC)   . Diabetes mellitus without complication (HCC)   . Hypertension   . MI (myocardial infarction) 2006      Surgical History:  Past Surgical History:  Procedure Laterality Date  . APPENDECTOMY    . CHOLECYSTECTOMY    . CORONARY ANGIOPLASTY WITH STENT PLACEMENT       Home Meds: Prior to Admission medications   Medication Sig Start Date End Date Taking? Authorizing Provider  diazepam (VALIUM) 10 MG tablet Take 5-10 mg by mouth 3 (three) times daily as needed for  anxiety.    Yes Historical Provider, MD  albuterol (PROVENTIL HFA;VENTOLIN HFA) 108 (90 Base) MCG/ACT inhaler Inhale 2 puffs into the lungs every 6 (six) hours as needed for wheezing or shortness of breath.    Historical Provider, MD  amLODipine (NORVASC) 5 MG tablet Take 5 mg by mouth daily.    Historical Provider, MD  aspirin EC 325 MG tablet Take 325 mg by mouth daily.    Historical Provider, MD  esomeprazole (NEXIUM) 40 MG capsule Take 40 mg by mouth daily.    Historical Provider, MD  gabapentin (NEURONTIN) 300 MG capsule Take 1,200 mg by mouth 3 (three) times daily.     Historical Provider, MD  metFORMIN (GLUCOPHAGE-XR) 500 MG 24 hr tablet Take 500 mg by mouth 2 (two) times daily. 09/05/15   Historical Provider, MD  metoprolol succinate (TOPROL XL) 25 MG 24 hr tablet Take 25 mg by mouth daily.    Historical Provider, MD    Inpatient Medications:  . amLODipine  5 mg Oral Daily  . aspirin EC  325 mg Oral Daily  . enoxaparin (LOVENOX) injection  40 mg Subcutaneous QHS  . folic acid  1 mg Oral Daily  . gabapentin  1,200 mg Oral TID  . Influenza vac split quadrivalent PF  0.5 mL Intramuscular Tomorrow-1000  . insulin aspart  0-20 Units Subcutaneous TID WC  . insulin aspart  0-5 Units Subcutaneous QHS  . metoprolol succinate  25 mg Oral Daily  . multivitamin with minerals  1 tablet Oral Daily  . pantoprazole  40 mg Oral Daily  . pantoprazole  40 mg Oral QAC breakfast  . phosphorus  250 mg Oral BID  . sodium chloride flush  3 mL Intravenous Q12H  . thiamine  100 mg Oral Daily   . sodium chloride 75 mL/hr at 08/06/16 2341    Allergies: No Known Allergies  Social History   Social History  . Marital status: Single    Spouse name: N/A  . Number of children: N/A  . Years of education: N/A   Occupational History  . Not on file.   Social History Main Topics  . Smoking status: Current Every Day Smoker    Packs/day: 1.00    Years: 40.00    Types: Cigarettes  . Smokeless tobacco:  Never Used  . Alcohol use 7.2 - 14.4 oz/week    12 - 24 Cans of beer per week     Comment: occ  . Drug use: No  . Sexual activity: Not on file   Other Topics Concern  . Not on file   Social History Narrative  . No narrative on file     Family History  Problem Relation Age of Onset  . CAD Mother     deceased 7058  . Stroke Father     deceased 8273  . Heart attack Brother     deceased 7358     Review of Systems Positive forRight sided face and right hand and right chest pain Negative for: General:  chills, fever, night sweats or weight changes.  Cardiovascular: PND orthopnea syncope dizziness  Dermatological skin lesions rashes Respiratory: Cough congestion Urologic: Frequent urination urination at night and hematuria Abdominal: negative for nausea, vomiting, diarrhea, bright red blood per rectum, melena, or hematemesis Neurologic: negative for visual changes, and/or hearing changes  All other systems reviewed and are otherwise negative except as noted above.  Labs:  Recent Labs  08/06/16 1807 08/07/16 0125 08/07/16 0626  TROPONINI <0.03 <0.03 <0.03   Lab Results  Component Value Date   WBC 11.4 (H) 08/07/2016   HGB 11.9 (L) 08/07/2016   HCT 34.7 (L) 08/07/2016   MCV 87.2 08/07/2016   PLT 346 08/07/2016    Recent Labs Lab 08/07/16 0442  NA 133*  K 3.8  CL 102  CO2 26  BUN 7  CREATININE 0.61  CALCIUM 8.3*  PROT 6.3*  BILITOT 0.5  ALKPHOS 99  ALT 10*  AST 14*  GLUCOSE 93   Lab Results  Component Value Date   CHOL 160 08/07/2016   HDL 35 (L) 08/07/2016   LDLCALC 100 (H) 08/07/2016   TRIG 127 08/07/2016   No results found for: DDIMER  Radiology/Studies:  Dg Chest 2 View  Result Date: 08/06/2016 CLINICAL DATA:  Generalized weakness and chest pain EXAM: CHEST  2 VIEW COMPARISON:  07/30/2016 FINDINGS: Cardiac shadow is stable. The lungs are hyperinflated. No focal infiltrate or sizable effusion is seen. Degenerative change of the thoracic spine is  noted. IMPRESSION: COPD without acute abnormality. Electronically Signed   By: Alcide CleverMark  Lukens M.D.   On: 08/06/2016 18:36   Dg Chest 2 View  Result Date: 07/30/2016 CLINICAL DATA:  Altercation, hit in chest with blunt object EXAM: CHEST  2 VIEW COMPARISON:  08/25/2015 FINDINGS: Coarse interstitial lung base opacities and probable scarring are unchanged. No acute infiltrate or effusion. No pneumothorax. Stable cardiomegaly without overt failure. There are old left fifth and sixth rib deformities. IMPRESSION: No radiographic evidence for acute cardiopulmonary abnormality. Electronically Signed   By: Jasmine PangKim  Fujinaga M.D.   On: 07/30/2016 22:15   Ct Head Wo Contrast  Result  Date: 07/30/2016 CLINICAL DATA:  Hit in the face with blunt uptake. EXAM: CT HEAD WITHOUT CONTRAST CT MAXILLOFACIAL WITHOUT CONTRAST CT CERVICAL SPINE WITHOUT CONTRAST TECHNIQUE: Contiguous axial images were obtained from the base of the skull through the vertex without intravenous contrast. Multidetector CT imaging of the maxillofacial structures was performed. Multiplanar CT image reconstructions were also generated. A small metallic BB was placed on the right temple in order to reliably differentiate right from left. Multidetector CT imaging of the cervical spine was performed without intravenous contrast. Multiplanar CT image reconstructions were also generated. COMPARISON:  None. FINDINGS: CT HEAD FINDINGS Brain: No evidence of acute infarction, hemorrhage, hydrocephalus, extra-axial collection or mass lesion/mass effect. Moderate brain parenchymal volume loss and deep white matter microangiopathy noted. Vascular: No hyperdense vessels. Calcific atherosclerotic disease at the skullbase. Skull: Normal. Negative for fracture or focal lesion. Other: None. CT MAXILLOFACIAL FINDINGS Osseous: There is a comminuted fracture of bilateral nasal bones, with internally displaced small fracture fragment on the right. There is minimal septal deviation  to the left. Orbits: Negative. No traumatic or inflammatory finding. Sinuses: Clear. Soft tissues: Right preseptal periorbital hematoma. Swelling and hematoma of the soft tissues overlying the nasal ridge also seen. CT CERVICAL SPINE FINDINGS Alignment: Normal. Skull base and vertebrae: No acute fracture. No primary bone lesion or focal pathologic process. Soft tissues and spinal canal: No prevertebral fluid or swelling. No visible canal hematoma. Disc levels:  Multilevel osteoarthritic changes. Upper chest: Bilateral apical advanced emphysematous changes. Other: Please note that evaluation of the cervical spine is degraded by motion artifact. IMPRESSION: No acute intracranial abnormality. Atrophy, chronic microvascular disease. No evidence of acute traumatic injury to the cervical spine, accounting for significant motion artifact. Multilevel osteoarthritic changes of the cervical spine. Bilateral nasal bone comminuted fractures with associated soft tissue swelling and hematoma. Right periorbital preseptal hematoma. Electronically Signed   By: Ted Mcalpine M.D.   On: 07/30/2016 22:31   Ct Cervical Spine Wo Contrast  Result Date: 07/30/2016 CLINICAL DATA:  Hit in the face with blunt uptake. EXAM: CT HEAD WITHOUT CONTRAST CT MAXILLOFACIAL WITHOUT CONTRAST CT CERVICAL SPINE WITHOUT CONTRAST TECHNIQUE: Contiguous axial images were obtained from the base of the skull through the vertex without intravenous contrast. Multidetector CT imaging of the maxillofacial structures was performed. Multiplanar CT image reconstructions were also generated. A small metallic BB was placed on the right temple in order to reliably differentiate right from left. Multidetector CT imaging of the cervical spine was performed without intravenous contrast. Multiplanar CT image reconstructions were also generated. COMPARISON:  None. FINDINGS: CT HEAD FINDINGS Brain: No evidence of acute infarction, hemorrhage, hydrocephalus,  extra-axial collection or mass lesion/mass effect. Moderate brain parenchymal volume loss and deep white matter microangiopathy noted. Vascular: No hyperdense vessels. Calcific atherosclerotic disease at the skullbase. Skull: Normal. Negative for fracture or focal lesion. Other: None. CT MAXILLOFACIAL FINDINGS Osseous: There is a comminuted fracture of bilateral nasal bones, with internally displaced small fracture fragment on the right. There is minimal septal deviation to the left. Orbits: Negative. No traumatic or inflammatory finding. Sinuses: Clear. Soft tissues: Right preseptal periorbital hematoma. Swelling and hematoma of the soft tissues overlying the nasal ridge also seen. CT CERVICAL SPINE FINDINGS Alignment: Normal. Skull base and vertebrae: No acute fracture. No primary bone lesion or focal pathologic process. Soft tissues and spinal canal: No prevertebral fluid or swelling. No visible canal hematoma. Disc levels:  Multilevel osteoarthritic changes. Upper chest: Bilateral apical advanced emphysematous changes. Other: Please note that  evaluation of the cervical spine is degraded by motion artifact. IMPRESSION: No acute intracranial abnormality. Atrophy, chronic microvascular disease. No evidence of acute traumatic injury to the cervical spine, accounting for significant motion artifact. Multilevel osteoarthritic changes of the cervical spine. Bilateral nasal bone comminuted fractures with associated soft tissue swelling and hematoma. Right periorbital preseptal hematoma. Electronically Signed   By: Ted Mcalpineobrinka  Dimitrova M.D.   On: 07/30/2016 22:31   Ct Maxillofacial Wo Contrast  Result Date: 07/30/2016 CLINICAL DATA:  Hit in the face with blunt uptake. EXAM: CT HEAD WITHOUT CONTRAST CT MAXILLOFACIAL WITHOUT CONTRAST CT CERVICAL SPINE WITHOUT CONTRAST TECHNIQUE: Contiguous axial images were obtained from the base of the skull through the vertex without intravenous contrast. Multidetector CT imaging of  the maxillofacial structures was performed. Multiplanar CT image reconstructions were also generated. A small metallic BB was placed on the right temple in order to reliably differentiate right from left. Multidetector CT imaging of the cervical spine was performed without intravenous contrast. Multiplanar CT image reconstructions were also generated. COMPARISON:  None. FINDINGS: CT HEAD FINDINGS Brain: No evidence of acute infarction, hemorrhage, hydrocephalus, extra-axial collection or mass lesion/mass effect. Moderate brain parenchymal volume loss and deep white matter microangiopathy noted. Vascular: No hyperdense vessels. Calcific atherosclerotic disease at the skullbase. Skull: Normal. Negative for fracture or focal lesion. Other: None. CT MAXILLOFACIAL FINDINGS Osseous: There is a comminuted fracture of bilateral nasal bones, with internally displaced small fracture fragment on the right. There is minimal septal deviation to the left. Orbits: Negative. No traumatic or inflammatory finding. Sinuses: Clear. Soft tissues: Right preseptal periorbital hematoma. Swelling and hematoma of the soft tissues overlying the nasal ridge also seen. CT CERVICAL SPINE FINDINGS Alignment: Normal. Skull base and vertebrae: No acute fracture. No primary bone lesion or focal pathologic process. Soft tissues and spinal canal: No prevertebral fluid or swelling. No visible canal hematoma. Disc levels:  Multilevel osteoarthritic changes. Upper chest: Bilateral apical advanced emphysematous changes. Other: Please note that evaluation of the cervical spine is degraded by motion artifact. IMPRESSION: No acute intracranial abnormality. Atrophy, chronic microvascular disease. No evidence of acute traumatic injury to the cervical spine, accounting for significant motion artifact. Multilevel osteoarthritic changes of the cervical spine. Bilateral nasal bone comminuted fractures with associated soft tissue swelling and hematoma. Right  periorbital preseptal hematoma. Electronically Signed   By: Ted Mcalpineobrinka  Dimitrova M.D.   On: 07/30/2016 22:31    EKG: Normal sinus rhythm with lateral T-wave inversion  Weights: Filed Weights   08/06/16 1748 08/06/16 2342  Weight: 98.9 kg (218 lb) 94.9 kg (209 lb 4.8 oz)     Physical Exam: Blood pressure (!) 142/87, pulse (!) 102, temperature 98.2 F (36.8 C), temperature source Oral, resp. rate 18, height 6' (1.829 m), weight 94.9 kg (209 lb 4.8 oz), SpO2 94 %. Body mass index is 28.39 kg/m. General: Well developed, well nourished, in no acute distress. Head eyes ears nose throat: Normocephalic, atraumatic, sclera non-icteric, no xanthomas, nares are without discharge. No apparent thyromegaly and/or mass  Lungs: Normal respiratory effort.  no wheezes, no rales, no rhonchi.  Heart: RRR with normal S1 S2. no murmur gallop, no rub, PMI is normal size and placement, carotid upstroke normal without bruit, jugular venous pressure is normal Abdomen: Soft, non-tender, non-distended with normoactive bowel sounds. No hepatomegaly. No rebound/guarding. No obvious abdominal masses. Abdominal aorta is normal size without bruit Extremities: No edema. no cyanosis, no clubbing, no ulcers  Peripheral : 2+ bilateral upper extremity pulses, 2+ bilateral femoral  pulses, 2+ bilateral dorsal pedal pulse Neuro: Alert and oriented. No facial asymmetry. No focal deficit. Moves all extremities spontaneously. Musculoskeletal: Normal muscle tone without kyphosis Psych:  Responds to questions appropriately with a normal affect.    Assessment: 60 year old male with history of coronary artery disease myocardial infarction essential hypertension makes hyperlipidemia having right-sided atypical chest discomfort resolved at this time without evidence of myocardial infarction  Plan: 1. No further cardiac diagnostics necessary at this time due to no recurrent chest pain which is atypical to begin with 2. Continue  hypertension control with amlodipine metoprolol 3. Aspirin for further risk reduction cardiovascular event 4. High intensity cholesterol therapy for coronary artery disease 5. Begin ambulation and follow for worsening symptoms or concerns for cardiovascular symptoms and if the ambulating well without further symptoms for discharge to home from the cardiac standpoint with follow-up next week  Signed, Lamar Blinks M.D. [facility] Cardiology 08/07/2016, 9:43 AM

## 2016-08-07 NOTE — Progress Notes (Signed)
Patient given discharge teaching and paperwork regarding medications, diet, follow-up appointments and activity. Patient understanding verbalized. No complaints at this time. IV and telemetry discontinued prior to leaving. Skin assessment as previously charted and vitals are stable; on room air. Patient being discharged to home.  Taxi voucher provided- No further needs by Care Management.

## 2016-08-07 NOTE — Progress Notes (Signed)
No nausea or vomiting, patient requesting regular foods instead of liquids. Ok to advance diet per Dr. Sherryll BurgerShah.

## 2016-08-07 NOTE — Discharge Instructions (Signed)

## 2016-08-08 LAB — UREA NITROGEN, URINE: Urea Nitrogen, Ur: 53 mg/dL

## 2016-08-08 LAB — MICROALBUMIN, URINE

## 2016-08-09 NOTE — Discharge Summary (Signed)
[facility] [place] [facility]   PATIENT NAME: Kristopher Powell    MR#:  [id number]  DATE OF BIRTH:  10/25/1955  DATE OF ADMISSION:  08/06/2016   ADMITTING PHYSICIAN: Tonye RoyaltyAlexis Hugelmeyer, DO  DATE OF DISCHARGE: 08/07/2016  1:29 PM  PRIMARY CARE PHYSICIAN: Candie ChromanBARZIN, AMIR HOMAYOUN, DO   ADMISSION DIAGNOSIS:  Hyponatremia [E87.1] DISCHARGE DIAGNOSIS:  Active Problems:   Dehydration with hyponatremia   Chest pain, rule out acute myocardial infarction   Chest pain  SECONDARY DIAGNOSIS:   Past Medical History:  Diagnosis Date  . CHF (congestive heart failure) (HCC)   . COPD (chronic obstructive pulmonary disease) (HCC)   . Diabetes mellitus without complication (HCC)   . Hypertension   . MI (myocardial infarction) 2006   HOSPITAL COURSE:  This is a 60 y.o. male with a history of MI with stent, CAD, CHF, HTN, DM on metformin, COPD on 3 L oxygen now being admitted with:  1. Chest pain  - Ruled out with serial negative troponins - Urine drug screen positive for benzodiazepine and tricyclic's  2. Acute on chronic hyponatremia - Resolved with hydration  3. ETOH intoxication / use disorder, with multiple electrolyte derangements including hypokalemia, hypophosphatemia likely secondary to decreased PO intake.  - Repleted and resolved  4. Intractable nausea and vomiting, possible gastroenteritis, EtOH gastritis - Resolved with symptomatic management  5. H/O DM  6. H/O COPD - Uses 3L Oxygen nasal cannula  7. H/O HTN - Continue home medications Norvasc, metoprolol DISCHARGE CONDITIONS:  Stable CONSULTS OBTAINED:  Treatment Team:  Lamar BlinksBruce J Kowalski, MD DRUG ALLERGIES:  No Known Allergies DISCHARGE MEDICATIONS:   Allergies as of 08/07/2016   No Known Allergies     Medication List    TAKE these medications   albuterol 108 (90 Base) MCG/ACT inhaler Commonly known as:  PROVENTIL HFA;VENTOLIN HFA Inhale 2 puffs into the lungs every 6 (six)  hours as needed for wheezing or shortness of breath.   amLODipine 5 MG tablet Commonly known as:  NORVASC Take 5 mg by mouth daily.   aspirin EC 325 MG tablet Take 325 mg by mouth daily.   diazepam 10 MG tablet Commonly known as:  VALIUM Take 5-10 mg by mouth 3 (three) times daily as needed for anxiety.   gabapentin 300 MG capsule Commonly known as:  NEURONTIN Take 1,200 mg by mouth 3 (three) times daily.   metFORMIN 500 MG 24 hr tablet Commonly known as:  GLUCOPHAGE-XR Take 500 mg by mouth 2 (two) times daily.   NEXIUM 40 MG capsule Generic drug:  esomeprazole Take 40 mg by mouth daily.   TOPROL XL 25 MG 24 hr tablet Generic drug:  metoprolol succinate Take 25 mg by mouth daily.      DISCHARGE INSTRUCTIONS:   DIET:  Regular diet DISCHARGE CONDITION:  Good ACTIVITY:  Activity as tolerated OXYGEN:  Home Oxygen: Yes.    Oxygen Delivery: 3 liters/min via Patient connected to nasal cannula oxygen DISCHARGE LOCATION:  home   If you experience worsening of your admission symptoms, develop shortness of breath, life threatening emergency, suicidal or homicidal thoughts you must seek medical attention immediately by calling 911 or calling your MD immediately  if symptoms less severe.  You Must read complete instructions/literature along with all the possible adverse reactions/side effects for all the Medicines you take and that have been prescribed to you. Take any new Medicines after you have completely understood and accpet all the possible adverse reactions/side effects.  Please note  You were cared for by a hospitalist during your hospital stay. If you have any questions about your discharge medications or the care you received while you were in the hospital after you are discharged, you can call the unit and asked to speak with the hospitalist on call if the hospitalist that took care of you is not available. Once you are discharged, your primary care physician will  handle any further medical issues. Please note that NO REFILLS for any discharge medications will be authorized once you are discharged, as it is imperative that you return to your primary care physician (or establish a relationship with a primary care physician if you do not have one) for your aftercare needs so that they can reassess your need for medications and monitor your lab values.    On the day of Discharge:  VITAL SIGNS:  Blood pressure (!) 142/87, pulse (!) 102, temperature 98.2 F (36.8 C), temperature source Oral, resp. rate 18, height 6' (1.829 m), weight 94.9 kg (209 lb 4.8 oz), SpO2 94 %. PHYSICAL EXAMINATION:  GENERAL:  60 y.o.-year-old patient lying in the bed with no acute distress.  EYES: Pupils equal, round, reactive to light and accommodation. No scleral icterus. Extraocular muscles intact.  HEENT: Head atraumatic, normocephalic. Oropharynx and nasopharynx clear.  NECK:  Supple, no jugular venous distention. No thyroid enlargement, no tenderness.  LUNGS: Normal breath sounds bilaterally, no wheezing, rales,rhonchi or crepitation. No use of accessory muscles of respiration.  CARDIOVASCULAR: S1, S2 normal. No murmurs, rubs, or gallops.  ABDOMEN: Soft, non-tender, non-distended. Bowel sounds present. No organomegaly or mass.  EXTREMITIES: No pedal edema, cyanosis, or clubbing.  NEUROLOGIC: Cranial nerves II through XII are intact. Muscle strength 5/5 in all extremities. Sensation intact. Gait not checked.  PSYCHIATRIC: The patient is alert and oriented x 3.  SKIN: No obvious rash, lesion, or ulcer.  DATA REVIEW:   CBC  Recent Labs Lab 08/07/16 0442  WBC 11.4*  HGB 11.9*  HCT 34.7*  PLT 346    Chemistries   Recent Labs Lab 08/06/16 2015 08/07/16 0442  NA 122* 133*  K  --  3.8  CL  --  102  CO2  --  26  GLUCOSE  --  93  BUN  --  7  CREATININE  --  0.61  CALCIUM  --  8.3*  MG 1.8  --   AST  --  14*  ALT  --  10*  ALKPHOS  --  99  BILITOT  --  0.5     Follow-up Information    BARZIN, AMIR HOMAYOUN, DO. Schedule an appointment as soon as possible for a visit in 1 week(s).   Specialty:  Family Medicine Contact information: [id number] [facility] [address] [phone]            Management plans discussed with the patient, family and they are in agreement.  CODE STATUS:  Code Status History    Date Active Date Inactive Code Status Order ID Comments User Context   08/06/2016 11:40 PM 08/07/2016  4:34 PM Full Code [id number]  Tonye RoyaltyAlexis Hugelmeyer, DO Inpatient   05/23/2015 10:14 PM 05/25/2015  2:59 PM Full Code [id number]  Altamese DillingVaibhavkumar Vachhani, MD Inpatient      TOTAL TIME TAKING CARE OF THIS PATIENT: 45 minutes.    Delfino LovettVipul Lelia Jons M.D on 08/09/2016 at 10:31 AM  Between 7am to 6pm - Pager - [phone]  After 6pm go to  [email or web address] - Social research officer, government  [facility] [place] [facility]  Office  [phone]  CC: Primary care physician; Candie Chroman, DO   Note: This dictation was prepared with Dragon dictation along with smaller phrase technology. Any transcriptional errors that result from this process are unintentional.

## 2016-08-13 ENCOUNTER — Inpatient Hospital Stay
Admission: EM | Admit: 2016-08-13 | Discharge: 2016-08-19 | DRG: 190 | Disposition: A | Discharge status: Home / Self Care | Payer: Medicaid Other | Attending: Internal Medicine | Admitting: Internal Medicine

## 2016-08-13 ENCOUNTER — Emergency Department: Payer: Medicaid Other

## 2016-08-13 ENCOUNTER — Encounter: Payer: Self-pay | Admitting: *Deleted

## 2016-08-13 DIAGNOSIS — J9622 Acute and chronic respiratory failure with hypercapnia: Secondary | ICD-10-CM | POA: Diagnosis present

## 2016-08-13 DIAGNOSIS — Z7984 Long term (current) use of oral hypoglycemic drugs: Secondary | ICD-10-CM

## 2016-08-13 DIAGNOSIS — Z955 Presence of coronary angioplasty implant and graft: Secondary | ICD-10-CM

## 2016-08-13 DIAGNOSIS — F419 Anxiety disorder, unspecified: Secondary | ICD-10-CM | POA: Diagnosis present

## 2016-08-13 DIAGNOSIS — Z823 Family history of stroke: Secondary | ICD-10-CM

## 2016-08-13 DIAGNOSIS — E872 Acidosis: Secondary | ICD-10-CM | POA: Diagnosis present

## 2016-08-13 DIAGNOSIS — E876 Hypokalemia: Secondary | ICD-10-CM | POA: Diagnosis present

## 2016-08-13 DIAGNOSIS — J9621 Acute and chronic respiratory failure with hypoxia: Secondary | ICD-10-CM | POA: Diagnosis present

## 2016-08-13 DIAGNOSIS — I252 Old myocardial infarction: Secondary | ICD-10-CM

## 2016-08-13 DIAGNOSIS — G629 Polyneuropathy, unspecified: Secondary | ICD-10-CM | POA: Diagnosis present

## 2016-08-13 DIAGNOSIS — F17210 Nicotine dependence, cigarettes, uncomplicated: Secondary | ICD-10-CM | POA: Diagnosis present

## 2016-08-13 DIAGNOSIS — I110 Hypertensive heart disease with heart failure: Secondary | ICD-10-CM | POA: Diagnosis present

## 2016-08-13 DIAGNOSIS — R0602 Shortness of breath: Secondary | ICD-10-CM

## 2016-08-13 DIAGNOSIS — Z9981 Dependence on supplemental oxygen: Secondary | ICD-10-CM

## 2016-08-13 DIAGNOSIS — E871 Hypo-osmolality and hyponatremia: Secondary | ICD-10-CM | POA: Diagnosis present

## 2016-08-13 DIAGNOSIS — J441 Chronic obstructive pulmonary disease with (acute) exacerbation: Secondary | ICD-10-CM

## 2016-08-13 DIAGNOSIS — I2510 Atherosclerotic heart disease of native coronary artery without angina pectoris: Secondary | ICD-10-CM | POA: Diagnosis present

## 2016-08-13 DIAGNOSIS — I509 Heart failure, unspecified: Secondary | ICD-10-CM | POA: Diagnosis present

## 2016-08-13 DIAGNOSIS — E119 Type 2 diabetes mellitus without complications: Secondary | ICD-10-CM | POA: Diagnosis present

## 2016-08-13 DIAGNOSIS — J209 Acute bronchitis, unspecified: Secondary | ICD-10-CM | POA: Diagnosis present

## 2016-08-13 DIAGNOSIS — Z7982 Long term (current) use of aspirin: Secondary | ICD-10-CM

## 2016-08-13 DIAGNOSIS — Z8249 Family history of ischemic heart disease and other diseases of the circulatory system: Secondary | ICD-10-CM

## 2016-08-13 DIAGNOSIS — F1010 Alcohol abuse, uncomplicated: Secondary | ICD-10-CM | POA: Diagnosis present

## 2016-08-13 LAB — CBC
HCT: 38.4 % — ABNORMAL LOW (ref 40.0–52.0)
HEMOGLOBIN: 13.1 g/dL (ref 13.0–18.0)
MCH: 29.2 pg (ref 26.0–34.0)
MCHC: 34.1 g/dL (ref 32.0–36.0)
MCV: 85.8 fL (ref 80.0–100.0)
PLATELETS: 483 10*3/uL — AB (ref 150–440)
RBC: 4.48 MIL/uL (ref 4.40–5.90)
RDW: 15 % — AB (ref 11.5–14.5)
WBC: 23.3 10*3/uL — ABNORMAL HIGH (ref 3.8–10.6)

## 2016-08-13 LAB — BRAIN NATRIURETIC PEPTIDE: B NATRIURETIC PEPTIDE 5: 158 pg/mL — AB (ref 0.0–100.0)

## 2016-08-13 LAB — BASIC METABOLIC PANEL
Anion gap: 11 (ref 5–15)
BUN: 6 mg/dL (ref 6–20)
CO2: 28 mmol/L (ref 22–32)
CREATININE: 0.56 mg/dL — AB (ref 0.61–1.24)
Calcium: 8.6 mg/dL — ABNORMAL LOW (ref 8.9–10.3)
Chloride: 89 mmol/L — ABNORMAL LOW (ref 101–111)
GFR calc Af Amer: >60 mL/min (ref >60)
GLUCOSE: 133 mg/dL — AB (ref 65–99)
POTASSIUM: 3.1 mmol/L — AB (ref 3.5–5.1)
Sodium: 128 mmol/L — ABNORMAL LOW (ref 135–145)

## 2016-08-13 LAB — TROPONIN I: TROPONIN I: 0.03 ng/mL — AB (ref <0.03)

## 2016-08-13 MED ORDER — IPRATROPIUM-ALBUTEROL 0.5-2.5 (3) MG/3ML IN SOLN: 3.0000 mL | Freq: Once | RESPIRATORY_TRACT | Status: DC

## 2016-08-13 MED ORDER — DEXTROSE 5 % IV SOLN
500.0000 mg | Freq: Once | INTRAVENOUS | Status: AC
  Administered 2016-08-13: 500 mg via INTRAVENOUS

## 2016-08-13 MED ORDER — DEXTROSE 5 % IV SOLN
INTRAVENOUS | Status: AC
  Administered 2016-08-13: 500 mg via INTRAVENOUS
  Filled 2016-08-13: qty 500

## 2016-08-13 MED ORDER — METHYLPREDNISOLONE SODIUM SUCC 125 MG IJ SOLR
INTRAMUSCULAR | Status: AC
  Administered 2016-08-13: 125 mg via INTRAVENOUS
  Filled 2016-08-13: qty 2

## 2016-08-13 MED ORDER — MAGNESIUM SULFATE 2 GM/50ML IV SOLN
INTRAVENOUS | Status: AC
  Administered 2016-08-13: 2 g via INTRAVENOUS
  Filled 2016-08-13: qty 50

## 2016-08-13 MED ORDER — METHYLPREDNISOLONE SODIUM SUCC 125 MG IJ SOLR
125.0000 mg | Freq: Once | INTRAMUSCULAR | Status: AC
  Administered 2016-08-13: 125 mg via INTRAVENOUS

## 2016-08-13 MED ORDER — IPRATROPIUM-ALBUTEROL 0.5-2.5 (3) MG/3ML IN SOLN
3.0000 mL | Freq: Once | RESPIRATORY_TRACT | Status: AC
  Administered 2016-08-13: 3 mL via RESPIRATORY_TRACT

## 2016-08-13 MED ORDER — MAGNESIUM SULFATE 2 GM/50ML IV SOLN
2.0000 g | Freq: Once | INTRAVENOUS | Status: AC
  Administered 2016-08-13: 2 g via INTRAVENOUS

## 2016-08-13 MED ORDER — ALBUTEROL SULFATE (2.5 MG/3ML) 0.083% IN NEBU
5.0000 mg | INHALATION_SOLUTION | Freq: Once | RESPIRATORY_TRACT | Status: AC
  Administered 2016-08-13: 5 mg via RESPIRATORY_TRACT
  Filled 2016-08-13: qty 6

## 2016-08-13 MED ORDER — IPRATROPIUM-ALBUTEROL 0.5-2.5 (3) MG/3ML IN SOLN
RESPIRATORY_TRACT | Status: AC
  Administered 2016-08-13: 3 mL via RESPIRATORY_TRACT
  Filled 2016-08-13: qty 3

## 2016-08-13 MED ORDER — IPRATROPIUM-ALBUTEROL 0.5-2.5 (3) MG/3ML IN SOLN
3.0000 mL | Freq: Once | RESPIRATORY_TRACT | Status: AC
  Administered 2016-08-13: 3 mL via RESPIRATORY_TRACT
  Filled 2016-08-13: qty 3

## 2016-08-13 NOTE — ED Triage Notes (Signed)
Pt presents w/ c/o shortness of breath, cough, hx of COPD. 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 Pt states he has a house and has heat and running water. Pt is profoundly short of breath on 02, tachypneic w/ audible expiratory wheezing.

## 2016-08-14 ENCOUNTER — Encounter: Payer: Self-pay | Admitting: Family Medicine

## 2016-08-14 DIAGNOSIS — I252 Old myocardial infarction: Secondary | ICD-10-CM | POA: Diagnosis not present

## 2016-08-14 DIAGNOSIS — F419 Anxiety disorder, unspecified: Secondary | ICD-10-CM | POA: Diagnosis present

## 2016-08-14 DIAGNOSIS — G629 Polyneuropathy, unspecified: Secondary | ICD-10-CM | POA: Diagnosis present

## 2016-08-14 DIAGNOSIS — J9622 Acute and chronic respiratory failure with hypercapnia: Secondary | ICD-10-CM | POA: Diagnosis present

## 2016-08-14 DIAGNOSIS — Z7984 Long term (current) use of oral hypoglycemic drugs: Secondary | ICD-10-CM | POA: Diagnosis not present

## 2016-08-14 DIAGNOSIS — I509 Heart failure, unspecified: Secondary | ICD-10-CM | POA: Diagnosis present

## 2016-08-14 DIAGNOSIS — Z955 Presence of coronary angioplasty implant and graft: Secondary | ICD-10-CM | POA: Diagnosis not present

## 2016-08-14 DIAGNOSIS — I2510 Atherosclerotic heart disease of native coronary artery without angina pectoris: Secondary | ICD-10-CM | POA: Diagnosis present

## 2016-08-14 DIAGNOSIS — F1010 Alcohol abuse, uncomplicated: Secondary | ICD-10-CM | POA: Diagnosis present

## 2016-08-14 DIAGNOSIS — Z823 Family history of stroke: Secondary | ICD-10-CM | POA: Diagnosis not present

## 2016-08-14 DIAGNOSIS — F17210 Nicotine dependence, cigarettes, uncomplicated: Secondary | ICD-10-CM | POA: Diagnosis present

## 2016-08-14 DIAGNOSIS — J209 Acute bronchitis, unspecified: Secondary | ICD-10-CM | POA: Diagnosis present

## 2016-08-14 DIAGNOSIS — J441 Chronic obstructive pulmonary disease with (acute) exacerbation: Secondary | ICD-10-CM | POA: Diagnosis present

## 2016-08-14 DIAGNOSIS — Z9981 Dependence on supplemental oxygen: Secondary | ICD-10-CM | POA: Diagnosis not present

## 2016-08-14 DIAGNOSIS — E876 Hypokalemia: Secondary | ICD-10-CM | POA: Diagnosis present

## 2016-08-14 DIAGNOSIS — E872 Acidosis: Secondary | ICD-10-CM | POA: Diagnosis present

## 2016-08-14 DIAGNOSIS — I110 Hypertensive heart disease with heart failure: Secondary | ICD-10-CM | POA: Diagnosis present

## 2016-08-14 DIAGNOSIS — J9621 Acute and chronic respiratory failure with hypoxia: Secondary | ICD-10-CM | POA: Diagnosis present

## 2016-08-14 DIAGNOSIS — Z8249 Family history of ischemic heart disease and other diseases of the circulatory system: Secondary | ICD-10-CM | POA: Diagnosis not present

## 2016-08-14 DIAGNOSIS — E871 Hypo-osmolality and hyponatremia: Secondary | ICD-10-CM | POA: Diagnosis present

## 2016-08-14 DIAGNOSIS — Z7982 Long term (current) use of aspirin: Secondary | ICD-10-CM | POA: Diagnosis not present

## 2016-08-14 DIAGNOSIS — E119 Type 2 diabetes mellitus without complications: Secondary | ICD-10-CM | POA: Diagnosis present

## 2016-08-14 LAB — GLUCOSE, CAPILLARY
Glucose-Capillary: 150 mg/dL — ABNORMAL HIGH (ref 65–99)
Glucose-Capillary: 94 mg/dL (ref 65–99)
Glucose-Capillary: 112 mg/dL — ABNORMAL HIGH (ref 65–99)

## 2016-08-14 LAB — TROPONIN I
Troponin I: 0.03 ng/mL (ref <0.03)
Troponin I: <0.03 ng/mL (ref <0.03)
Troponin I: 0.03 ng/mL (ref <0.03)

## 2016-08-14 LAB — BLOOD GAS, VENOUS
ACID-BASE EXCESS: 2.7 mmol/L — AB (ref 0.0–2.0)
Bicarbonate: 30.7 mmol/L — ABNORMAL HIGH (ref 20.0–28.0)
FIO2: 0.36
PATIENT TEMPERATURE: 37
PH VEN: 7.31 (ref 7.250–7.430)
pCO2, Ven: 61 mmHg — ABNORMAL HIGH (ref 44.0–60.0)

## 2016-08-14 LAB — TSH: TSH: 0.632 u[IU]/mL (ref 0.350–4.500)

## 2016-08-14 LAB — ETHANOL: Alcohol, Ethyl (B): <5 mg/dL (ref <5)

## 2016-08-14 MED ORDER — SODIUM CHLORIDE 0.9 % IV BOLUS (SEPSIS)
250.0000 mL | Freq: Once | INTRAVENOUS | Status: AC
  Administered 2016-08-14: 250 mL via INTRAVENOUS

## 2016-08-14 MED ORDER — ORAL CARE MOUTH RINSE
15.0000 mL | Freq: Two times a day (BID) | OROMUCOSAL | Status: DC
  Administered 2016-08-14 – 2016-08-18 (×4): 15 mL via OROMUCOSAL

## 2016-08-14 MED ORDER — TIOTROPIUM BROMIDE MONOHYDRATE 18 MCG IN CAPS
18.0000 ug | ORAL_CAPSULE | Freq: Every day | RESPIRATORY_TRACT | Status: DC
  Administered 2016-08-14 – 2016-08-18 (×5): 18 ug via RESPIRATORY_TRACT
  Filled 2016-08-14: qty 5

## 2016-08-14 MED ORDER — AZITHROMYCIN 250 MG PO TABS
250.0000 mg | ORAL_TABLET | Freq: Every day | ORAL | Status: DC
  Administered 2016-08-14 – 2016-08-17 (×4): 250 mg via ORAL
  Filled 2016-08-14 (×4): qty 1

## 2016-08-14 MED ORDER — GABAPENTIN 400 MG PO CAPS
1200.0000 mg | ORAL_CAPSULE | Freq: Three times a day (TID) | ORAL | Status: DC
  Administered 2016-08-14 – 2016-08-18 (×12): 1200 mg via ORAL
  Filled 2016-08-14 (×12): qty 3

## 2016-08-14 MED ORDER — NITROGLYCERIN 0.4 MG SL SUBL
0.4000 mg | SUBLINGUAL_TABLET | Freq: Once | SUBLINGUAL | Status: AC
  Administered 2016-08-14: 0.4 mg via SUBLINGUAL
  Filled 2016-08-14: qty 1

## 2016-08-14 MED ORDER — PREDNISONE 20 MG PO TABS: 30.0000 mg | ORAL_TABLET | Freq: Every day | ORAL | Status: DC

## 2016-08-14 MED ORDER — ONDANSETRON HCL 4 MG/2ML IJ SOLN: 4.0000 mg | Freq: Four times a day (QID) | INTRAMUSCULAR | Status: DC | PRN

## 2016-08-14 MED ORDER — POTASSIUM CHLORIDE CRYS ER 20 MEQ PO TBCR
40.0000 meq | EXTENDED_RELEASE_TABLET | ORAL | Status: AC
  Administered 2016-08-14: 40 meq via ORAL
  Filled 2016-08-14: qty 2

## 2016-08-14 MED ORDER — PANTOPRAZOLE SODIUM 40 MG PO TBEC
40.0000 mg | DELAYED_RELEASE_TABLET | Freq: Every day | ORAL | Status: DC
  Administered 2016-08-14 – 2016-08-18 (×5): 40 mg via ORAL
  Filled 2016-08-14 (×5): qty 1

## 2016-08-14 MED ORDER — SODIUM CHLORIDE 0.9% FLUSH
3.0000 mL | Freq: Two times a day (BID) | INTRAVENOUS | Status: DC
  Administered 2016-08-14 – 2016-08-18 (×10): 3 mL via INTRAVENOUS

## 2016-08-14 MED ORDER — ACETAMINOPHEN 325 MG PO TABS
650.0000 mg | ORAL_TABLET | Freq: Four times a day (QID) | ORAL | Status: DC | PRN
  Administered 2016-08-17: 650 mg via ORAL
  Filled 2016-08-14: qty 2

## 2016-08-14 MED ORDER — ONDANSETRON HCL 4 MG PO TABS: 4.0000 mg | ORAL_TABLET | Freq: Four times a day (QID) | ORAL | Status: DC | PRN

## 2016-08-14 MED ORDER — PREDNISONE 50 MG PO TABS
50.0000 mg | ORAL_TABLET | Freq: Every day | ORAL | Status: AC
  Administered 2016-08-15: 50 mg via ORAL
  Filled 2016-08-14: qty 1

## 2016-08-14 MED ORDER — DIAZEPAM 5 MG PO TABS
5.0000 mg | ORAL_TABLET | Freq: Three times a day (TID) | ORAL | Status: DC | PRN
  Administered 2016-08-16 (×2): 10 mg via ORAL
  Administered 2016-08-17 (×3): 5 mg via ORAL
  Filled 2016-08-14: qty 1
  Filled 2016-08-14: qty 2
  Filled 2016-08-14 (×2): qty 1
  Filled 2016-08-14: qty 2

## 2016-08-14 MED ORDER — METOPROLOL SUCCINATE ER 25 MG PO TB24
25.0000 mg | ORAL_TABLET | Freq: Every day | ORAL | Status: DC
  Administered 2016-08-14 – 2016-08-19 (×6): 25 mg via ORAL
  Filled 2016-08-14 (×6): qty 1

## 2016-08-14 MED ORDER — LORAZEPAM 2 MG/ML IJ SOLN
1.0000 mg | Freq: Once | INTRAMUSCULAR | Status: AC
  Administered 2016-08-14: 1 mg via INTRAVENOUS

## 2016-08-14 MED ORDER — ACETAMINOPHEN 650 MG RE SUPP: 650.0000 mg | Freq: Four times a day (QID) | RECTAL | Status: DC | PRN

## 2016-08-14 MED ORDER — DOCUSATE SODIUM 100 MG PO CAPS
100.0000 mg | ORAL_CAPSULE | Freq: Two times a day (BID) | ORAL | Status: DC
  Administered 2016-08-14 – 2016-08-19 (×9): 100 mg via ORAL
  Filled 2016-08-14 (×10): qty 1

## 2016-08-14 MED ORDER — PREDNISONE 20 MG PO TABS: 20.0000 mg | ORAL_TABLET | Freq: Every day | ORAL | Status: DC

## 2016-08-14 MED ORDER — SODIUM CHLORIDE 0.9 % IV BOLUS (SEPSIS): 500.0000 mL | Freq: Once | INTRAVENOUS | Status: DC

## 2016-08-14 MED ORDER — PREDNISONE 20 MG PO TABS
40.0000 mg | ORAL_TABLET | Freq: Every day | ORAL | Status: AC
  Administered 2016-08-16: 40 mg via ORAL
  Filled 2016-08-14: qty 2

## 2016-08-14 MED ORDER — ENOXAPARIN SODIUM 40 MG/0.4ML ~~LOC~~ SOLN
40.0000 mg | SUBCUTANEOUS | Status: DC
  Administered 2016-08-14 – 2016-08-18 (×5): 40 mg via SUBCUTANEOUS
  Filled 2016-08-14 (×5): qty 0.4

## 2016-08-14 MED ORDER — ASPIRIN EC 325 MG PO TBEC
325.0000 mg | DELAYED_RELEASE_TABLET | Freq: Every day | ORAL | Status: DC
  Administered 2016-08-14 – 2016-08-19 (×6): 325 mg via ORAL
  Filled 2016-08-14 (×6): qty 1

## 2016-08-14 MED ORDER — INSULIN ASPART 100 UNIT/ML ~~LOC~~ SOLN: 0.0000 [IU] | Freq: Every day | SUBCUTANEOUS | Status: DC

## 2016-08-14 MED ORDER — INSULIN ASPART 100 UNIT/ML ~~LOC~~ SOLN
0.0000 [IU] | Freq: Three times a day (TID) | SUBCUTANEOUS | Status: DC
Start: 2016-08-14 — End: 2016-08-19
  Administered 2016-08-14: 2 [IU] via SUBCUTANEOUS
  Administered 2016-08-15 (×2): 3 [IU] via SUBCUTANEOUS
  Administered 2016-08-16: 5 [IU] via SUBCUTANEOUS
  Administered 2016-08-17: 2 [IU] via SUBCUTANEOUS
  Administered 2016-08-17: 3 [IU] via SUBCUTANEOUS
  Administered 2016-08-18: 5 [IU] via SUBCUTANEOUS
  Administered 2016-08-18 (×2): 2 [IU] via SUBCUTANEOUS
  Filled 2016-08-14: qty 5
  Filled 2016-08-14: qty 3
  Filled 2016-08-14 (×3): qty 2
  Filled 2016-08-14: qty 3
  Filled 2016-08-14: qty 2
  Filled 2016-08-14: qty 5
  Filled 2016-08-14: qty 3

## 2016-08-14 MED ORDER — NITROGLYCERIN 2 % TD OINT: 1.0000 [in_us] | TOPICAL_OINTMENT | Freq: Once | TRANSDERMAL | Status: DC

## 2016-08-14 MED ORDER — ALBUTEROL SULFATE (2.5 MG/3ML) 0.083% IN NEBU
2.5000 mg | INHALATION_SOLUTION | Freq: Once | RESPIRATORY_TRACT | Status: AC
  Administered 2016-08-14: 2.5 mg via RESPIRATORY_TRACT
  Filled 2016-08-14: qty 3

## 2016-08-14 MED ORDER — AMLODIPINE BESYLATE 5 MG PO TABS
5.0000 mg | ORAL_TABLET | Freq: Every day | ORAL | Status: DC
  Administered 2016-08-14 – 2016-08-19 (×6): 5 mg via ORAL
  Filled 2016-08-14 (×6): qty 1

## 2016-08-14 MED ORDER — LORAZEPAM 2 MG/ML IJ SOLN
INTRAMUSCULAR | Status: AC
  Administered 2016-08-14: 1 mg via INTRAVENOUS
  Filled 2016-08-14: qty 1

## 2016-08-14 NOTE — ED Notes (Signed)
RT to bedside to set patient up with NIPPV therapy per MD order.

## 2016-08-14 NOTE — ED Notes (Signed)
MD aware of continually elevated BPs. Will start with SL NTG x 1 dose now to see if this will help his blood pressure.

## 2016-08-14 NOTE — Progress Notes (Addendum)
Admitted this morning because of COPD exacerbation acute on chronic respiratory failure with hypoxia., acute bronchitis. Started on nebulizers, steroids. Patient says that he feels little better. Patient also found to have a hyponatremia, hypokalemia.  Vitals'; blood pressure 161/87, heart rate 106, generation 93% on 4 litres  Exam ;overall unkempt, poor hygiene. Cardiovascular S1-S2 irregular tachycardic lungs bilateral expiratory wheeze in all lung fields Abdomen; soft, non tender, nondistended bowels are present  #1. acute on chronic respiratory failure due to COPD exacerbation: Continue nebulizers, steroids, oxygen, antibiotics and follow clinical course #2. essential hypertension, sinus tachycardia continue amlodipine, metoprolol #3. type 2 diabetes mellitus: Continue sliding scale insulin with coverage, tomorrow diabetic medication  4.slightly elevated troponin secondary to respiratory distress'troponin 0.03. No chest pain. No further cardiac intervention is needed at this time. #5 .tobacco abuse; counseled against smoking, patient continues to smoke.  #6 hyponatremia, hypokalemia: Has chronic hyponatremia, for hypokalemia he is getting potassium replacement.  time spent;35 minutes.

## 2016-08-14 NOTE — ED Notes (Signed)
MD with VORB for 250cc NS bolus. Order to be entered and carried by this RN.

## 2016-08-14 NOTE — ED Notes (Signed)
Blood pressure responsive to single 0.4mg  dose of SL NTG; currently reading 126/80. MD made aware.

## 2016-08-14 NOTE — ED Notes (Signed)
Patient seems to be tolerating supplemental oxygen via [place]; sats 94% at this time. WOB has significantly reduced as compared to earlier in the night, however increases with exertion. BP remains stable following the previously administered NTG; currently 144/89. No verbalized needs. Will continue to monitor.

## 2016-08-14 NOTE — ED Notes (Signed)
Patient with constant requests for water. This patient is well known to this RN, as I cared for him recently for a significant HYPOnatremia. Patient has consumed over a liter of water since this RN assumed care at 2300. If RN does not provide the water, patient will unsafely get OOB, and go over to the sink and begin drinking. This was the same behavior that the patient exhibited during the last encounter that this RN had with him. MD made aware of the behavior.

## 2016-08-14 NOTE — ED Notes (Signed)
Patient seems to be getting increasingly altered. Patient ringing call light while RN at bedside. RN asked patient why he was doing this. Patient states, "I am at the drive through. I am ordering me a fan". Patient reoriented by this RN; states, "Oh ok. I forgot". Patient asking for water once again. RN not willing to provide any further at this time. Patient has had a significant about of water PO, as well as IV fluids ans medications. His respiratory status is not improving; increasing rales and expiratory wheezing at this time. Patient is also HYPOnatremic; sodium 128. Patient states, "I am just going to get it myself then". RN out of room to speak with MD. POC discussed with MD. Patient does not seem to be improving with the interventions that have been implemented thus far. MD with VORB for NIPPV; RT called at this time.

## 2016-08-14 NOTE — ED Notes (Signed)
MD with VORB for second 250cc NS bolus secondary to patients continued tachycardia. Order to be entered and carried by this RN. Of note, MD had entered a 500cc bolus already [facility]; order discontinued as a single 250cc bolus has already been hung.

## 2016-08-14 NOTE — ED Notes (Signed)
Patient's son at bedside at this time. Asked that RN and RT leave the room citing that he needed some "privacy time" with his father. Patient verbalizes that this is ok with him. Hospital staff out of the room.

## 2016-08-14 NOTE — ED Notes (Signed)
MD with VORB to hold orders. Monitor displayed a SBP reading of over 200, however this reading was from when patient was getting out of bed; elevated reading due to extremity movement.

## 2016-08-14 NOTE — ED Notes (Signed)
RT to this RN to advise that NIPPV therapy has been started, however patient was not tolerating. Patient anxious and pulling mask off of his face. RN to bedside. Patient anxious and restless; moving about in bed, sitting up and laying back, yelling through the mask saying that he was hot and thirsty. RN out of the room to speak with MD. MD with VORB for Ativan 1mg  IVP at this time. [facility] be [facility] and carried by emergency department nursing staff.

## 2016-08-14 NOTE — ED Notes (Signed)
Into patient room, patient found to be sitting on toilet holding call bell but has not pulled it. Patient states that he unhooked himself and went to toilet. Remained with patient until back in stretcher. Pt inquiring why he is here, states he just knew that he was SOB. Pt ambulates with ease.

## 2016-08-14 NOTE — ED Notes (Signed)
MD to bedside for patient reassessment. MD auscultated patient's lungs and feels like his lung sounds have changed. BNP is elevated at 158, however MD does not feel as if patient is experiencing a significant CHF episode. MD with VORB to discontinue second fluid bolus; patient will have only received the initial 250cc bolus. Patient continues to ask for water. Like last time this RN cared for patient, he is asking for urinals to drink from citing that he is not being given enough to drink. Patient states, "I know where they are. I will get it if you dont give it to me". MD aware.

## 2016-08-14 NOTE — ED Notes (Addendum)
MD and this RN to bedside. Patient observed sitting up in bed with increased WOB noted; (+) accessory muscle use, increased RR (36), and inability to speak in complete sentences without significant breathlessness. Patient showing ST on the monitor with a rate in the 120-130s. MD with orders for: Duoneb x 2, Magnesium Sulfate 2 grams, SoluMedrol 125mg , blood cultures x 2 sets, and VGB. Orders to be entered and carried by this RN. Patient has a single PIV in is LEFT hand; will require additional access.

## 2016-08-14 NOTE — ED Notes (Signed)
MD with VORB to wean patient from NIPPV therapy at this time. On note, patient is on chronic supplemental oxygen therapy at home; uses 3L/[place] ATC. Bipap removed and patient placed on supplemental oxygen via [place] at 4L. RN remained at bedside x 5 minutes, and patient monitored for an additional 5 minutes via central monitoring. Oxygen saturations maintained at between 93 and 95%. MD made aware.

## 2016-08-14 NOTE — Progress Notes (Signed)
Patient admitted to unit. Oriented to room, call bell, and staff. Bed in lowest position. Fall safety plan reviewed. Full assessment to Epic. Skin assessment verified with Paulino RilyAshley RN. Telemetry box verification with tele clerk and Juan NT Box#: [id number] Will continue to monitor.

## 2016-08-14 NOTE — ED Notes (Signed)
Report to Ladona Ridgelaylor, RN on 2A

## 2016-08-14 NOTE — ED Notes (Signed)
RN back to bedside. RT with patient and reports that he is doing better with regards to tolerating the ordered NIPPV therapy. RT observed replacing the mask on the patient; reports that she just was giving him water. RN advised patient that his fluid are now being restricted; MD does not wish for patient to have any additional oral fluids at this time. Patient and family member made aware. No further verbalized needs; will continue to monitor.

## 2016-08-14 NOTE — ED Provider Notes (Signed)
[facility] Emergency Department Provider Note   ____________________________________________   First MD Initiated Contact with Patient 08/13/16 2304     (approximate)  I have reviewed the triage vital signs and the nursing notes.   HISTORY  Chief Complaint Respiratory Distress    HPI Kristopher Powell is a 60 y.o. male who comes into the hospital today with shortness of breath for a couple of weeks. The patient reports that he's had a really bad cough and a lot of phlegm. He reports that it's a whitish/green color. The patient has some chest tightness. He reports that he has not been using his inhalers. The patient was given some albuterol in triage but was hypoxic on his initial arrival. The patient wears 3 L of O2 at home by nasal cannula. He reports that he's had some headache and dizziness along with his symptoms. The patient denies any fevers and denies any sick contacts. The patient was recently admitted to the hospital with weakness and hyponatremia. He is here again today.   Past Medical History:  Diagnosis Date  . CHF (congestive heart failure) (HCC)   . COPD (chronic obstructive pulmonary disease) (HCC)   . Diabetes mellitus without complication (HCC)   . Hypertension   . MI (myocardial infarction) 2006    Patient Active Problem List   Diagnosis Date Noted  . Chest pain 08/07/2016  . Dehydration with hyponatremia 08/06/2016  . Chest pain, rule out acute myocardial infarction 08/06/2016  . CHF (congestive heart failure) (HCC)   . Generalized anxiety disorder 08/14/2015  . Benzodiazepine abuse 08/14/2015  . Acute delirium 08/14/2015  . Diabetes (HCC) 08/14/2015  . COPD (chronic obstructive pulmonary disease) (HCC) 08/14/2015  . Suicidal ideation 08/14/2015  . Hyponatremia 05/23/2015  . Hypokalemia 05/23/2015    Past Surgical History:  Procedure Laterality Date  . APPENDECTOMY    . CHOLECYSTECTOMY    . CORONARY ANGIOPLASTY WITH STENT  PLACEMENT      Prior to Admission medications   Medication Sig Start Date End Date Taking? Authorizing Provider  albuterol (PROVENTIL HFA;VENTOLIN HFA) 108 (90 Base) MCG/ACT inhaler Inhale 2 puffs into the lungs every 6 (six) hours as needed for wheezing or shortness of breath.   Yes Historical Provider, MD  amLODipine (NORVASC) 5 MG tablet Take 5 mg by mouth daily.   Yes Historical Provider, MD  aspirin EC 325 MG tablet Take 325 mg by mouth daily.   Yes Historical Provider, MD  diazepam (VALIUM) 10 MG tablet Take 5-10 mg by mouth 3 (three) times daily as needed for anxiety.    Yes Historical Provider, MD  esomeprazole (NEXIUM) 40 MG capsule Take 40 mg by mouth daily.   Yes Historical Provider, MD  gabapentin (NEURONTIN) 300 MG capsule Take 1,200 mg by mouth 3 (three) times daily.    Yes Historical Provider, MD  metFORMIN (GLUCOPHAGE-XR) 500 MG 24 hr tablet Take 500 mg by mouth 2 (two) times daily. 09/05/15  Yes Historical Provider, MD  metoprolol succinate (TOPROL XL) 25 MG 24 hr tablet Take 25 mg by mouth daily.   Yes Historical Provider, MD    Allergies Patient has no known allergies.  Family History  Problem Relation Age of Onset  . CAD Mother     deceased 8558  . Stroke Father     deceased 5073  . Heart attack Brother     deceased 2758    Social History Social History  Substance Use Topics  . Smoking status: Current  Every Day Smoker    Packs/day: 1.00    Years: 40.00    Types: Cigarettes  . Smokeless tobacco: Never Used  . Alcohol use 7.2 - 14.4 oz/week    12 - 24 Cans of beer per week     Comment: occ    Review of Systems Constitutional: No fever/chills Eyes: No visual changes. ENT: No sore throat. Cardiovascular:  chest pain. Respiratory:  shortness of breath. Gastrointestinal: No abdominal pain.  No nausea, no vomiting.  No diarrhea.  No constipation. Genitourinary: Negative for dysuria. Musculoskeletal: Negative for back pain. Skin: Negative for  rash. Neurological: Negative for headaches, focal weakness or numbness.  10-point ROS otherwise negative.  ____________________________________________   PHYSICAL EXAM:  VITAL SIGNS: ED Triage Vitals  Enc Vitals Group     BP 08/13/16 2154 (!) 112/95     Pulse Rate 08/13/16 2154 (!) 118     Resp 08/13/16 2154 (!) 40     Temp 08/13/16 2154 98.2 F (36.8 C)     Temp Source 08/13/16 2154 Oral     SpO2 08/13/16 2154 (!) 84 %     Weight 08/13/16 2209 225 lb (102.1 kg)     Height 08/13/16 2209 6' (1.829 m)     Head Circumference --      Peak Flow --      Pain Score 08/13/16 2209 6     Pain Loc --      Pain Edu? --      Excl. in GC? --     Constitutional: Alert and oriented. Ill appearing and in moderate respiratory distress. Eyes: Conjunctivae are normal. PERRL. EOMI. Head: Atraumatic. Nose: No congestion/rhinnorhea. Mouth/Throat: Mucous membranes are moist.  Oropharynx non-erythematous. Cardiovascular: Tachycardia, regular rhythm. Grossly normal heart sounds.  Good peripheral circulation. Respiratory: Increased respiratory effort.  Suprasternal retractions. Tight expiratory wheezes with some crackles at his bilateral bases Gastrointestinal: Soft and nontender. No distention. Positive bowel sounds Musculoskeletal: No lower extremity tenderness nor edema.   Neurologic:  Normal speech and language.  Skin:  Skin is warm, dry and intact.  Psychiatric: Mood and affect are normal.   ____________________________________________   LABS (all labs ordered are listed, but only abnormal results are displayed)  Labs Reviewed  CBC - Abnormal; Notable for the following:       Result Value   WBC 23.3 (*)    HCT 38.4 (*)    RDW 15.0 (*)    Platelets 483 (*)    All other components within normal limits  TROPONIN I - Abnormal; Notable for the following:    Troponin I 0.03 (*)    All other components within normal limits  BRAIN NATRIURETIC PEPTIDE - Abnormal; Notable for the  following:    B Natriuretic Peptide 158.0 (*)    All other components within normal limits  BLOOD GAS, VENOUS - Abnormal; Notable for the following:    pCO2, Ven 61 (*)    Bicarbonate 30.7 (*)    Acid-Base Excess 2.7 (*)    All other components within normal limits  BASIC METABOLIC PANEL - Abnormal; Notable for the following:    Sodium 128 (*)    Potassium 3.1 (*)    Chloride 89 (*)    Glucose, Bld 133 (*)    Creatinine, Ser 0.56 (*)    Calcium 8.6 (*)    All other components within normal limits  CULTURE, BLOOD (ROUTINE X 2)  CULTURE, BLOOD (ROUTINE X 2)   ____________________________________________  EKG  ED  ECG REPORT I, Rebecka ApleyWebster,  Jerie Basford P, the attending physician, personally viewed and interpreted this ECG.   Date: 08/13/2016  EKG Time: 2159  Rate: 117  Rhythm: sinus tachycardia  Axis: normal  Intervals:none  ST&T Change: none  ____________________________________________  RADIOLOGY  CXR ____________________________________________   PROCEDURES  Procedure(s) performed: None  Procedures  Critical Care performed: No  ____________________________________________   INITIAL IMPRESSION / ASSESSMENT AND PLAN / ED COURSE  Pertinent labs & imaging results that were available during my care of the patient were reviewed by me and considered in my medical decision making (see chart for details).  This is a 60 year old male with a history of COPD and CHF who comes into the hospital with shortness of breath. The patient does have some wheezing as well as some crackles. I will check some blood work as well as a chest x-ray. I will give the patient to duo nebs as well as Solu-Medrol and magnesium sulfate. I will then reassess the patient.  Clinical Course as of Aug 14 221  Sat Aug 14, 2016  0003 Increased bronchial thickening suggesting acute bronchitis superimposed on emphysema/ COPD. Small left pleural effusion.   DG Chest 2 View [AW]    Clinical Course User  Index [AW] Rebecka ApleyAllison P Cesare Sumlin, MD    The patient does not have a pneumonia on his chest x-ray. He still has some significant tight wheezing. His PCO2 was 60 once I will place the patient on some BiPAP to see if that will help open him up slightly. The patient will receive a small bolus of 250 of normal saline. The patient will be reassessed.  The patient still had some increased work of breathing side did place him on BiPAP while I awaited the steroids and the magnesium to work. The patient fell asleep after a milligram of Ativan to help with his anxiety. His oxygen saturation was 100% and he was doing very well. I will admit the patient to the hospitalist service. We will give the patient some time on the BiPAP to help with his work of breathing and then discontinue it so he may be admitted upstairs. ____________________________________________   FINAL CLINICAL IMPRESSION(S) / ED DIAGNOSES  Final diagnoses:  COPD exacerbation (HCC)      NEW MEDICATIONS STARTED DURING THIS VISIT:  New Prescriptions   No medications on file     Note:  This document was prepared using Dragon voice recognition software and may include unintentional dictation errors.    Rebecka ApleyAllison P Pacey Willadsen, MD 08/14/16 Earle [id number]

## 2016-08-14 NOTE — H&P (Addendum)
Kristopher Powell is an 60 y.o. male.   Chief Complaint: Shortness of breath HPI: The patient with past medical history of coronary artery disease status post myocardial infarction and subsequent CHF presents emergency department complaining of shortness of breath. Upon arrival the patient was unable to speak in complete sentences. His respiratory rate was high and he was hypoxic to 84% on room air. He was placed on BiPAP. ABG showed mild acidosis with hypoxia and hypercapnia. He was given Solu-Medrol and azithromycin. After a view hours on BiPAP the patient was able to be weaned to 4 L of oxygen via nasal cannula. He admits to coughing and bringing up scant amounts of clear to yellow colored sputum but otherwise does not contribute much to his history. When asked if his chest hurt he said "a little bit" and tapped his chest, but otherwise was reticent to describe more of his chest pain. Laboratory evaluation revealed acute on chronic hyponatremia, hypokalemia and leukocytosis which prompted the emergency department staff to call for admission.  Past Medical History:  Diagnosis Date  . CHF (congestive heart failure) ([place])   . COPD (chronic obstructive pulmonary disease) ([place])   . Diabetes mellitus without complication ([place])   . Hypertension   . MI (myocardial infarction) 2006    Past Surgical History:  Procedure Laterality Date  . APPENDECTOMY    . CHOLECYSTECTOMY    . CORONARY ANGIOPLASTY WITH STENT PLACEMENT      Family History  Problem Relation Age of Onset  . CAD Mother     deceased 27  . Stroke Father     deceased 60  . Heart attack Brother     deceased 68   Social History:  reports that he has been smoking Cigarettes.  He has a 40.00 pack-year smoking history. He has never used smokeless tobacco. He reports that he drinks about 7.2 - 14.4 oz of alcohol per week . He reports that he does not use drugs.  Allergies: No Known Allergies  Prior to Admission medications   Medication  Sig Start Date End Date Taking? Authorizing Provider  albuterol (PROVENTIL HFA;VENTOLIN HFA) 108 (90 Base) MCG/ACT inhaler Inhale 2 puffs into the lungs every 6 (six) hours as needed for wheezing or shortness of breath.   Yes Historical Provider, MD  amLODipine (NORVASC) 5 MG tablet Take 5 mg by mouth daily.   Yes Historical Provider, MD  aspirin EC 325 MG tablet Take 325 mg by mouth daily.   Yes Historical Provider, MD  diazepam (VALIUM) 10 MG tablet Take 5-10 mg by mouth 3 (three) times daily as needed for anxiety.    Yes Historical Provider, MD  esomeprazole (NEXIUM) 40 MG capsule Take 40 mg by mouth daily.   Yes Historical Provider, MD  gabapentin (NEURONTIN) 300 MG capsule Take 1,200 mg by mouth 3 (three) times daily.    Yes Historical Provider, MD  metFORMIN (GLUCOPHAGE-XR) 500 MG 24 hr tablet Take 500 mg by mouth 2 (two) times daily. 09/05/15  Yes Historical Provider, MD  metoprolol succinate (TOPROL XL) 25 MG 24 hr tablet Take 25 mg by mouth daily.   Yes Historical Provider, MD     Results for orders placed or performed during the hospital encounter of 08/13/16 (from the past 48 hour(s))  CBC     Status: Abnormal   Collection Time: 08/13/16 10:28 PM  Result Value Ref Range   WBC 23.3 (H) 3.8 - 10.6 K/uL   RBC 4.48 4.40 - 5.90 MIL/uL  Hemoglobin 13.1 13.0 - 18.0 g/dL   HCT 38.4 (L) 40.0 - 52.0 %   MCV 85.8 80.0 - 100.0 fL   MCH 29.2 26.0 - 34.0 pg   MCHC 34.1 32.0 - 36.0 g/dL   RDW 15.0 (H) 11.5 - 14.5 %   Platelets 483 (H) 150 - 440 K/uL  Troponin I     Status: Abnormal   Collection Time: 08/13/16 10:28 PM  Result Value Ref Range   Troponin I 0.03 (HH) <0.03 ng/mL    Comment: CRITICAL RESULT CALLED TO, READ BACK BY AND VERIFIED WITH BRYAN GRAY AT 2314 08/13/16.PMH  Brain natriuretic peptide     Status: Abnormal   Collection Time: 08/13/16 10:28 PM  Result Value Ref Range   B Natriuretic Peptide 158.0 (H) 0.0 - 100.0 pg/mL  Blood gas, venous     Status: Abnormal (Preliminary  result)   Collection Time: 08/13/16 11:22 PM  Result Value Ref Range   FIO2 0.36    pH, Ven 7.31 7.250 - 7.430   pCO2, Ven 61 (H) 44.0 - 60.0 mmHg   pO2, Ven PENDING 32.0 - 45.0 mmHg   Bicarbonate 30.7 (H) 20.0 - 28.0 mmol/L   Acid-Base Excess 2.7 (H) 0.0 - 2.0 mmol/L   O2 Saturation PENDING %   Patient temperature 37.0    Collection site VEIN    Sample type VENOUS   Basic metabolic panel     Status: Abnormal   Collection Time: 08/13/16 11:22 PM  Result Value Ref Range   Sodium 128 (L) 135 - 145 mmol/L   Potassium 3.1 (L) 3.5 - 5.1 mmol/L   Chloride 89 (L) 101 - 111 mmol/L   CO2 28 22 - 32 mmol/L   Glucose, Bld 133 (H) 65 - 99 mg/dL   BUN 6 6 - 20 mg/dL   Creatinine, Ser 0.56 (L) 0.61 - 1.24 mg/dL   Calcium 8.6 (L) 8.9 - 10.3 mg/dL   GFR calc non Af Amer >60 >60 mL/min   GFR calc Af Amer >60 >60 mL/min    Comment: (NOTE) The eGFR has been calculated using the CKD EPI equation. This calculation has not been validated in all clinical situations. eGFR's persistently <60 mL/min signify possible Chronic Kidney Disease.    Anion gap 11 5 - 15   Dg Chest 2 View  Result Date: 08/13/2016 CLINICAL DATA:  Shortness of breath for 3 days.  Cough.  Dyspnea. EXAM: CHEST  2 VIEW COMPARISON:  08/06/2016 FINDINGS: Stable hyperinflation. Increased bronchial thickening unchanged heart size and mediastinal contours. There is a trace left pleural effusion. No focal airspace disease. No pneumothorax. There is degenerative change in the thoracic spine. Remote left rib fractures. IMPRESSION: Increased bronchial thickening suggesting acute bronchitis superimposed on emphysema/ COPD. Small left pleural effusion. Electronically Signed   By: Jeb Levering M.D.   On: 08/13/2016 23:17    Review of Systems  Constitutional: Negative for chills and fever.  HENT: Negative for sore throat and tinnitus.   Eyes: Negative for blurred vision and redness.  Respiratory: Positive for cough and shortness of  breath.   Cardiovascular: Positive for chest pain. Negative for palpitations, orthopnea and PND.  Gastrointestinal: Negative for abdominal pain, diarrhea, nausea and vomiting.  Genitourinary: Negative for dysuria, frequency and urgency.  Musculoskeletal: Negative for joint pain and myalgias.  Skin: Negative for rash.       No lesions  Neurological: Negative for speech change, focal weakness and weakness.  Endo/Heme/Allergies: Does not bruise/bleed easily.  No temperature intolerance  Psychiatric/Behavioral: Negative for depression and suicidal ideas.    Blood pressure (!) 144/89, pulse 97, temperature 98.2 F (36.8 C), temperature source Oral, resp. rate (!) 40, height 6' (1.829 m), weight 102.1 kg (225 lb), SpO2 93 %. Physical Exam  Constitutional: He is oriented to person, place, and time. He appears well-developed and well-nourished. No distress.  HENT:  Head: Normocephalic.  Mouth/Throat: Oropharynx is clear and moist.  Healing inferior periorbital burns bilaterally  Eyes: Conjunctivae and EOM are normal. Pupils are equal, round, and reactive to light. No scleral icterus.  Neck: Normal range of motion. Neck supple. No JVD present. No tracheal deviation present. No thyromegaly present.  Cardiovascular: Regular rhythm and normal heart sounds.  Exam reveals no gallop and no friction rub.   No murmur heard. Respiratory: He has wheezes.  GI: Soft. Bowel sounds are normal. He exhibits no distension. There is no tenderness.  Genitourinary:  Genitourinary Comments: Deferred  Musculoskeletal: Normal range of motion. He exhibits no edema.  Lymphadenopathy:    He has no cervical adenopathy.  Neurological: He is alert and oriented to person, place, and time. No cranial nerve deficit.  Skin: Skin is warm and dry. No rash noted. No erythema.  Psychiatric: His behavior is normal. Judgment and thought content normal. His affect is blunt.     Assessment/Plan This is a 60 year old male  admitted for acute on chronic respiratory failure. 1. Acute on chronic respiratory failure: With hypoxia and hypercapnia. Improved clinically after BiPAP trial. Now on 4 L by nasal cannula. Continue steroid taper as well as azithromycin for anti-inflammatory effect. Albuterol as needed. Start Spiriva. 2. Coronary artery disease: Initial troponin is 0.3; EKG shows sinus tachycardia. Etiology of chest pain likely the patient's prolonged increased work of breathing. Continue to monitor telemetry. Follow cardiac enzymes. 3. Hypertension: Reasonably controlled; continue amlodipine and metoprolol 4. Diabetes mellitus type 2: Hold oral hypoglycemic agents. Sliding scale insulin while hospitalized 5. Hyponatremia: Likely multifactoral; recurrent exacerbations of COPD as well as underlying CHF. The patient also has a history of alcohol abuse which may contribute to his low sodium as well. Encourage by mouth intake and hydrate with intravenous saline. 6. DVT prophylaxis: Lovenox 7. GI prophylaxis: PPI per home regimen The patient is a full code. Time spent on admission orders and patient care approximately 45 minutes  Harrie Foreman, MD 08/14/2016, 6:56 AM

## 2016-08-14 NOTE — ED Notes (Signed)
Patient tolerating NIPPV therapy well at this point. Continues to be somewhat anxious. VS stable; ST without ectopy on the monitor at at rate of 112. SPO2 100%. Will continue to monitor.

## 2016-08-14 NOTE — ED Notes (Signed)
Patient noted to have oxygen desaturations; sats in the low 80s. RN to bedside. Patient observed getting back into the bed; crawling in from the foot of the bed. Patient has removed NIPPV mask once again; holding mask in place as he cannot get straps back on alone. RN asked why he keeps removing the mask; patient states, "I have to breathe every once in a while". RN reoriented patient and made him aware of the fact that the mask was in place to help him breath. Water slightly running in the room; although it was not observed, patient suspected to have been OOB drinking water from the sink again; cup noted next to the sink. Patient educated once again on the fluid restrictions that we discussed earlier. Patient cannot continue to consume large amounts of water; verbalizes understanding. Patient asked not to drink any fluids without letting RN know, especially while on NIPPV therapy, due to concerns related to microaspirations; once again verbalized understanding.

## 2016-08-14 NOTE — ED Notes (Signed)
RN called to bedside. Patient observed sitting on the side of his bed; monitoring equipment and Bipap has been removed. Patient is has pulled the PIV from his LEFT hand; catheter observed laying in the floor. Patient has urinated all over the floor and bed linens. His socks are soaked with urine. Patient with increased SOB once again; RR 46 and SPO2 in the 70s. Linens changed; patient asked to return to his bed. Monitoring equipment and NIPPV mask replaced. Patient disoriented.

## 2016-08-14 NOTE — ED Notes (Signed)
RN called to bedside. Patient observed standing at bedside urinating in the floor. Patient redirected; advised that urinal is available. Patient states, "I filled it up and I emptied it" - reports that he also dumped the urinal in the floor. Patient asked to utilize call light when he needs to void, or when he needs his urinal emptied; verbalizes understanding. Call light is within patient's reach and side rails are up. Will continue to monitor.

## 2016-08-15 LAB — BASIC METABOLIC PANEL
ANION GAP: 6 (ref 5–15)
BUN: 10 mg/dL (ref 6–20)
CALCIUM: 8.9 mg/dL (ref 8.9–10.3)
CO2: 33 mmol/L — ABNORMAL HIGH (ref 22–32)
Chloride: 97 mmol/L — ABNORMAL LOW (ref 101–111)
Creatinine, Ser: 0.69 mg/dL (ref 0.61–1.24)
Glucose, Bld: 177 mg/dL — ABNORMAL HIGH (ref 65–99)
Potassium: 4.2 mmol/L (ref 3.5–5.1)
SODIUM: 136 mmol/L (ref 135–145)

## 2016-08-15 LAB — GLUCOSE, CAPILLARY
Glucose-Capillary: 104 mg/dL — ABNORMAL HIGH (ref 65–99)
Glucose-Capillary: 167 mg/dL — ABNORMAL HIGH (ref 65–99)
Glucose-Capillary: 156 mg/dL — ABNORMAL HIGH (ref 65–99)
Glucose-Capillary: 121 mg/dL — ABNORMAL HIGH (ref 65–99)

## 2016-08-15 LAB — HEMOGLOBIN A1C
Hgb A1c MFr Bld: 5.2 % (ref 4.8–5.6)
Mean Plasma Glucose: 103 mg/dL

## 2016-08-15 MED ORDER — METFORMIN HCL ER 500 MG PO TB24
500.0000 mg | ORAL_TABLET | Freq: Two times a day (BID) | ORAL | Status: DC
  Administered 2016-08-15 – 2016-08-19 (×8): 500 mg via ORAL
  Filled 2016-08-15 (×10): qty 1

## 2016-08-15 MED ORDER — NICOTINE 14 MG/24HR TD PT24
14.0000 mg | MEDICATED_PATCH | Freq: Every day | TRANSDERMAL | Status: DC
  Administered 2016-08-15 – 2016-08-19 (×5): 14 mg via TRANSDERMAL
  Filled 2016-08-15 (×5): qty 1

## 2016-08-15 MED ORDER — GUAIFENESIN ER 600 MG PO TB12
600.0000 mg | ORAL_TABLET | Freq: Two times a day (BID) | ORAL | Status: DC
  Administered 2016-08-15 – 2016-08-19 (×9): 600 mg via ORAL
  Filled 2016-08-15 (×9): qty 1

## 2016-08-15 MED ORDER — HYDROCOD POLST-CPM POLST ER 10-8 MG/5ML PO SUER
5.0000 mL | Freq: Two times a day (BID) | ORAL | Status: DC
  Administered 2016-08-15 – 2016-08-18 (×7): 5 mL via ORAL
  Filled 2016-08-15 (×7): qty 5

## 2016-08-15 NOTE — Plan of Care (Signed)
Problem: Phase I Progression Outcomes Goal: Hemodynamically stable Outcome: Progressing VSS, patient remains dyspneic on exertion

## 2016-08-15 NOTE — Progress Notes (Signed)
No new blood work this morning. Per Dr. Luberta MutterKonidena, order stat BMP to recheck low Na & K.

## 2016-08-15 NOTE — Progress Notes (Signed)
[facility] [place] at [facility]   PATIENT NAME: Kristopher SawyersHarmon Powell    MR#:  [id number]  DATE OF BIRTH:  12/23/1955  SUBJECTIVE: Patient is seen at bedside, admitted for COPD exacerbation, acute bronchitis. He feels wheezy, short of breath today. Has lots of cough unable to get the phlegm out.   CHIEF COMPLAINT:   Chief Complaint  Patient presents with  . Respiratory Distress    REVIEW OF SYSTEMS:   Review of Systems  Constitutional: Negative for chills and fever.  HENT: Negative for hearing loss.   Eyes: Negative for blurred vision, double vision and photophobia.  Respiratory: Positive for cough, shortness of breath and wheezing. Negative for hemoptysis.   Cardiovascular: Negative for palpitations, orthopnea and leg swelling.  Gastrointestinal: Negative for abdominal pain, diarrhea and vomiting.  Genitourinary: Negative for dysuria and urgency.  Musculoskeletal: Negative for myalgias and neck pain.  Skin: Negative for rash.  Neurological: Negative for dizziness, focal weakness, seizures, weakness and headaches.  Psychiatric/Behavioral: Negative for memory loss. The patient does not have insomnia.    [place].  PSYCHIATRY: No anxiety or depression.     No Known Allergies  VITALS:  Blood pressure (!) 148/95, pulse 84, temperature 98 F (36.7 C), temperature source Oral, resp. rate 20, height 6' (1.829 m), weight 88.3 kg (194 lb 11.2 oz), SpO2 98 %.  PHYSICAL EXAMINATION:  GENERAL:  60 y.o.-year-old patient lying in the bed with no acute distress.  EYES: Pupils equal, round, reactive to light and accommodation. No scleral icterus. Extraocular muscles intact.  HEENT: Head atraumatic, normocephalic. Oropharynx and nasopharynx clear.  NECK:  Supple, no jugular venous distention. No thyroid enlargement, no tenderness.  LUNGS: Bilateral expiratory wheeze in all lung fields. CARDIOVASCULAR: S1, S2 normal. No murmurs, rubs, or gallops.  ABDOMEN: Soft,  nontender, nondistended. Bowel sounds present. No organomegaly or mass.  EXTREMITIES: No pedal edema, cyanosis, or clubbing.  NEUROLOGIC: Cranial nerves II through XII are intact. Muscle strength 5/5 in all extremities. Sensation intact. Gait not checked.  PSYCHIATRIC: The patient is alert and oriented x 3.  SKIN: No obvious rash, lesion, or ulcer.    LABORATORY PANEL:   CBC  Recent Labs Lab 08/13/16 2228  WBC 23.3*  HGB 13.1  HCT 38.4*  PLT 483*   ------------------------------------------------------------------------------------------------------------------  Chemistries   Recent Labs Lab 08/15/16 1014  NA 136  K 4.2  CL 97*  CO2 33*  GLUCOSE 177*  BUN 10  CREATININE 0.69  CALCIUM 8.9   ------------------------------------------------------------------------------------------------------------------  Cardiac Enzymes  Recent Labs Lab 08/14/16 2236  TROPONINI 0.03*   ------------------------------------------------------------------------------------------------------------------  RADIOLOGY:  Dg Chest 2 View  Result Date: 08/13/2016 CLINICAL DATA:  Shortness of breath for 3 days.  Cough.  Dyspnea. EXAM: CHEST  2 VIEW COMPARISON:  08/06/2016 FINDINGS: Stable hyperinflation. Increased bronchial thickening unchanged heart size and mediastinal contours. There is a trace left pleural effusion. No focal airspace disease. No pneumothorax. There is degenerative change in the thoracic spine. Remote left rib fractures. IMPRESSION: Increased bronchial thickening suggesting acute bronchitis superimposed on emphysema/ COPD. Small left pleural effusion. Electronically Signed   By: Rubye OaksMelanie  Ehinger M.D.   On: 08/13/2016 23:17    EKG:   Orders placed or performed during the hospital encounter of 08/13/16  . EKG 12-Lead  . EKG 12-Lead  . ED EKG  . ED EKG    ASSESSMENT AND PLAN:   Acute COPD exacerbation: Clinically still has lots of wheezing, cough: Continue oxygen,  steroids, nebulizers, antibiotics,  add Tussionex for the cough. And Mucinex for medical lytic effect.  #2 chronic respiratory failure, uses 3 L of oxygen at home, continue to smoke. Advised to quit smoking  Hypokalemia, hyponatremia improved.   #4 slightly elevated troponins the secondary to respiratory distress:  5. essential hypertension: Controlled #6 CAD: No chest pain, continue home medicines #7 diabetes mellitus type 2: Restart oral medicines.  All the records are reviewed and case discussed with Care Management/Social Workerr. Management plans discussed with the patient, family and they are in agreement.  CODE STATUS: full  TOTAL TIME TAKING CARE OF THIS PATIENT: 35 minutes.   POSSIBLE D/C IN 1-2 DAYS, DEPENDING ON CLINICAL CONDITION.   Katha HammingKONIDENA,Ade Stmarie M.D on 08/15/2016 at 12:33 PM  Between 7am to 6pm - Pager - [phone]  After 6pm go to [email or web address] - password [email or web address] [email or web address]  [facility]  Office  [phone]  CC: Primary care physician; Candie ChromanBARZIN, AMIR HOMAYOUN, DO   Note: This dictation was prepared with Dragon dictation along with smaller phrase technology. Any transcriptional errors that result from this process are unintentional.

## 2016-08-16 ENCOUNTER — Inpatient Hospital Stay: Payer: Medicaid Other

## 2016-08-16 LAB — GLUCOSE, CAPILLARY
Glucose-Capillary: 99 mg/dL (ref 65–99)
Glucose-Capillary: 104 mg/dL — ABNORMAL HIGH (ref 65–99)
Glucose-Capillary: 211 mg/dL — ABNORMAL HIGH (ref 65–99)
Glucose-Capillary: 121 mg/dL — ABNORMAL HIGH (ref 65–99)

## 2016-08-16 MED ORDER — METHYLPREDNISOLONE SODIUM SUCC 125 MG IJ SOLR
60.0000 mg | Freq: Four times a day (QID) | INTRAMUSCULAR | Status: DC
  Administered 2016-08-16 – 2016-08-18 (×8): 60 mg via INTRAVENOUS
  Filled 2016-08-16 (×8): qty 2

## 2016-08-16 NOTE — Progress Notes (Signed)
[facility] - [place] at [facility]   PATIENT NAME: Kristopher Powell    MR#:  [id number]  DATE OF BIRTH:  1956/03/13  SUBJECTIVE: She is still having lots of wheezing. No chest pain no fever   CHIEF COMPLAINT:   Chief Complaint  Patient presents with  . Respiratory Distress    REVIEW OF SYSTEMS:   Review of Systems  Constitutional: Negative for chills and fever.  HENT: Negative for hearing loss.   Eyes: Negative for blurred vision, double vision and photophobia.  Respiratory: Positive for cough, shortness of breath and wheezing. Negative for hemoptysis.   Cardiovascular: Negative for palpitations, orthopnea and leg swelling.  Gastrointestinal: Negative for abdominal pain, diarrhea and vomiting.  Genitourinary: Negative for dysuria and urgency.  Musculoskeletal: Negative for myalgias and neck pain.  Skin: Negative for rash.  Neurological: Negative for dizziness, focal weakness, seizures, weakness and headaches.  Psychiatric/Behavioral: Negative for memory loss. The patient does not have insomnia.    [place].  PSYCHIATRY: No anxiety or depression.     No Known Allergies  VITALS:  Blood pressure (!) 141/91, pulse 93, temperature 97.8 F (36.6 C), temperature source Oral, resp. rate (!) 22, height 6' (1.829 m), weight 85.9 kg (189 lb 4.8 oz), SpO2 97 %.  PHYSICAL EXAMINATION:  GENERAL:  61 y.o.-year-old patient lying in the bed with no acute distress.  EYES: Pupils equal, round, reactive to light and accommodation. No scleral icterus. Extraocular muscles intact.  HEENT: Head atraumatic, normocephalic. Oropharynx and nasopharynx clear.  NECK:  Supple, no jugular venous distention. No thyroid enlargement, no tenderness.  LUNGS: Bilateral expiratory wheeze in all lung fields. CARDIOVASCULAR: S1, S2 normal. No murmurs, rubs, or gallops.  ABDOMEN: Soft, nontender, nondistended. Bowel sounds present. No organomegaly or mass.  EXTREMITIES: No pedal edema,  cyanosis, or clubbing.  NEUROLOGIC: Cranial nerves II through XII are intact. Muscle strength 5/5 in all extremities. Sensation intact. Gait not checked.  PSYCHIATRIC: The patient is alert and oriented x 3.  SKIN: No obvious rash, lesion, or ulcer.    LABORATORY PANEL:   CBC  Recent Labs Lab 08/13/16 2228  WBC 23.3*  HGB 13.1  HCT 38.4*  PLT 483*   ------------------------------------------------------------------------------------------------------------------  Chemistries   Recent Labs Lab 08/15/16 1014  NA 136  K 4.2  CL 97*  CO2 33*  GLUCOSE 177*  BUN 10  CREATININE 0.69  CALCIUM 8.9   ------------------------------------------------------------------------------------------------------------------  Cardiac Enzymes  Recent Labs Lab 08/14/16 2236  TROPONINI 0.03*   ------------------------------------------------------------------------------------------------------------------  RADIOLOGY:  No results found.  EKG:   Orders placed or performed during the hospital encounter of 08/13/16  . EKG 12-Lead  . EKG 12-Lead  . ED EKG  . ED EKG    ASSESSMENT AND PLAN:   Acute COPD exacerbation: Clinically still has lots of wheezing, cough: Continue oxygen, steroids, nebulizers, antibiotics, add Tussionex for the cough. Chest  Xray today,continue oxygen 4 L..  #2 chronic respiratory failure, uses 3 L of oxygen at home, continue to smoke. Advised to quit smoking  Hypokalemia, hyponatremia improved.   #4 slightly elevated troponins the secondary to respiratory distress:  5. essential hypertension: Controlled.  #6 CAD: No chest pain, continue home medicines  #7 diabetes mellitus type 2: Restarted  oral medicines.  All the records are reviewed and case discussed with Care Management/Social Workerr. Management plans discussed with the patient, family and they are in agreement.  CODE STATUS: full  TOTAL TIME TAKING CARE OF THIS PATIENT: 35 minutes.  POSSIBLE D/C IN 1-2 DAYS, DEPENDING ON CLINICAL CONDITION.   Katha HammingKONIDENA,Heinz Eckert M.D on 08/16/2016 at 1:55 PM  Between 7am to 6pm - Pager - [phone]  After 6pm go to [email or web address] - password [email or web address] [facility]  [facility]  Office  [phone]  CC: Primary care physician; Candie ChromanBARZIN, AMIR HOMAYOUN, DO   Note: This dictation was prepared with Dragon dictation along with smaller phrase technology. Any transcriptional errors that result from this process are unintentional.

## 2016-08-17 ENCOUNTER — Inpatient Hospital Stay: Payer: Medicaid Other

## 2016-08-17 ENCOUNTER — Encounter: Payer: Self-pay | Admitting: Radiology

## 2016-08-17 LAB — GLUCOSE, CAPILLARY
Glucose-Capillary: 138 mg/dL — ABNORMAL HIGH (ref 65–99)
Glucose-Capillary: 168 mg/dL — ABNORMAL HIGH (ref 65–99)
Glucose-Capillary: 144 mg/dL — ABNORMAL HIGH (ref 65–99)

## 2016-08-17 MED ORDER — GUAIFENESIN-DM 100-10 MG/5ML PO SYRP
5.0000 mL | ORAL_SOLUTION | ORAL | Status: DC | PRN
Start: 2016-08-17 — End: 2016-08-19
  Administered 2016-08-17: 5 mL via ORAL
  Filled 2016-08-17: qty 5

## 2016-08-17 MED ORDER — IOPAMIDOL (ISOVUE-300) INJECTION 61%
75.0000 mL | Freq: Once | INTRAVENOUS | Status: AC | PRN
  Administered 2016-08-17: 75 mL via INTRAVENOUS

## 2016-08-17 NOTE — Progress Notes (Signed)
4 L of oxygen. NSR. FS are stable. Pt reports no pain. Urinal. Pt reports anxiety and pain and received tylenol and valuium. Takes meds ok. Pt has no further concerns at this time.

## 2016-08-17 NOTE — Care Management (Signed)
Kristopher Powell is okay right now, he could not talk much but does not need anything at the moment.

## 2016-08-17 NOTE — Plan of Care (Signed)
Problem: Safety: Goal: Ability to remain free from injury will improve Outcome: Progressing Up with assist, bed alarm In use

## 2016-08-17 NOTE — Care Management (Signed)
Patient has chronic home oxygen through [employer].  He is followed by Dr Barzin with [facility] and last seen two months ago.  Patient says he is Independent in all adls, denies issues accessing medical care, obtaining medications or with transportation.  Patient firmly declines any home health nursing follow up because "I just don't want it.. I don't need it."  Unable to persuade at present and CM made decision not to pressure because patient appears to become anxious when discussing. Discussed mobilization during progression to prevent decline in functional status

## 2016-08-17 NOTE — Progress Notes (Signed)
[facility] -  at [facility]   PATIENT NAME: Kristopher Powell    MR#:  [id number]  DATE OF BIRTH:  1956-01-23  SUBJECTIVE: pt seen at bedside.having wheezing,chest xray yesterday showed no pneumonia.now treating for copd exacerbation,  CHIEF COMPLAINT:   Chief Complaint  Patient presents with  . Respiratory Distress    REVIEW OF SYSTEMS:   Review of Systems  Constitutional: Negative for chills and fever.  HENT: Negative for hearing loss.   Eyes: Negative for blurred vision, double vision and photophobia.  Respiratory: Positive for cough, shortness of breath and wheezing. Negative for hemoptysis.   Cardiovascular: Negative for palpitations, orthopnea and leg swelling.  Gastrointestinal: Negative for abdominal pain, diarrhea and vomiting.  Genitourinary: Negative for dysuria and urgency.  Musculoskeletal: Negative for myalgias and neck pain.  Skin: Negative for rash.  Neurological: Negative for dizziness, focal weakness, seizures, weakness and headaches.  Psychiatric/Behavioral: Negative for memory loss. The patient does not have insomnia.    [place].  PSYCHIATRY: No anxiety or depression.     No Known Allergies  VITALS:  Blood pressure (!) 157/99, pulse 100, temperature 98.1 F (36.7 C), temperature source Oral, resp. rate 20, height 6' (1.829 m), weight 89 kg (196 lb 1.6 oz), SpO2 96 %.  PHYSICAL EXAMINATION:  GENERAL:  61 y.o.-year-old patient lying in the bed with no acute distress.  EYES: Pupils equal, round, reactive to light and accommodation. No scleral icterus. Extraocular muscles intact.  HEENT: Head atraumatic, normocephalic. Oropharynx and nasopharynx clear.  NECK:  Supple, no jugular venous distention. No thyroid enlargement, no tenderness.  LUNGS: Bilateral expiratory wheeze in all lung fields not much different  CARDIOVASCULAR: S1, S2 normal. No murmurs, rubs, or gallops.  ABDOMEN: Soft, nontender, nondistended. Bowel sounds  present. No organomegaly or mass.  EXTREMITIES: No pedal edema, cyanosis, or clubbing.  NEUROLOGIC: Cranial nerves II through XII are intact. Muscle strength 5/5 in all extremities. Sensation intact. Gait not checked.  PSYCHIATRIC: The patient is alert and oriented x 3.  SKIN: No obvious rash, lesion, or ulcer.    LABORATORY PANEL:   CBC  Recent Labs Lab 08/13/16 2228  WBC 23.3*  HGB 13.1  HCT 38.4*  PLT 483*   ------------------------------------------------------------------------------------------------------------------  Chemistries   Recent Labs Lab 08/15/16 1014  NA 136  K 4.2  CL 97*  CO2 33*  GLUCOSE 177*  BUN 10  CREATININE 0.69  CALCIUM 8.9   ------------------------------------------------------------------------------------------------------------------  Cardiac Enzymes  Recent Labs Lab 08/14/16 2236  TROPONINI 0.03*   ------------------------------------------------------------------------------------------------------------------  RADIOLOGY:  Dg Chest 1 View  Result Date: 08/16/2016 CLINICAL DATA:  Cough, shortness of breath, and wheezing.  COPD. EXAM: CHEST 1 VIEW COMPARISON:  08/13/2016 FINDINGS: Heart size and pulmonary vascularity remain normal. The lungs are hyperinflated consistent with COPD. Peribronchial thickening. Pulmonary vascularity is normal. Old healed left posterolateral rib fractures. IMPRESSION: Bronchitic changes have improved since the prior study. No acute infiltrates. COPD. Electronically Signed   By: Francene Boyers M.D.   On: 08/16/2016 14:34    EKG:   Orders placed or performed during the hospital encounter of 08/13/16  . EKG 12-Lead  . EKG 12-Lead  . ED EKG  . ED EKG    ASSESSMENT AND PLAN:   Acute COPD exacerbation: Clinically still has lots of wheezing, cough:  Chest xray showed no pneumonia. Continue oxygen, steroids, nebulizers, antibiotics, add Tussionex for the cough.  Very slow to recover.will get ct chest  today..   #2 chronic respiratory failure,  uses 3 L of oxygen at home, continue to smoke. Advised to quit smoking  Hypokalemia, hyponatremia improved.   #4 slightly elevated troponins the secondary to respiratory distress:  5. essential hypertension: Controlled.  #6 CAD: No chest pain, continue home medicines  #7 diabetes mellitus type 2: Restarted  oral medicines.  All the records are reviewed and case discussed with Care Management/Social Workerr. Management plans discussed with the patient, family and they are in agreement.  CODE STATUS: full  TOTAL TIME TAKING CARE OF THIS PATIENT: 35 minutes.   POSSIBLE D/C IN 1-2 DAYS, DEPENDING ON CLINICAL CONDITION.   Katha HammingKONIDENA,Ahren Pettinger M.D on 08/17/2016 at 4:12 PM  Between 7am to 6pm - Pager - [phone]  After 6pm go to [email or web address] - password [email or web address] [facility]  [facility]  Office  [phone]  CC: Primary care physician; Candie ChromanBARZIN, AMIR HOMAYOUN, DO   Note: This dictation was prepared with Dragon dictation along with smaller phrase technology. Any transcriptional errors that result from this process are unintentional.

## 2016-08-18 LAB — GLUCOSE, CAPILLARY
Glucose-Capillary: 129 mg/dL — ABNORMAL HIGH (ref 65–99)
Glucose-Capillary: 129 mg/dL — ABNORMAL HIGH (ref 65–99)
Glucose-Capillary: 212 mg/dL — ABNORMAL HIGH (ref 65–99)
Glucose-Capillary: 136 mg/dL — ABNORMAL HIGH (ref 65–99)
Glucose-Capillary: 95 mg/dL (ref 65–99)

## 2016-08-18 LAB — CULTURE, BLOOD (ROUTINE X 2)
Culture: NO GROWTH
Culture: NO GROWTH

## 2016-08-18 MED ORDER — POLYETHYLENE GLYCOL 3350 17 G PO PACK
17.0000 g | PACK | Freq: Every day | ORAL | Status: DC
  Administered 2016-08-18 – 2016-08-19 (×2): 17 g via ORAL
  Filled 2016-08-18 (×2): qty 1

## 2016-08-18 MED ORDER — MOMETASONE FURO-FORMOTEROL FUM 200-5 MCG/ACT IN AERO
2.0000 | INHALATION_SPRAY | Freq: Two times a day (BID) | RESPIRATORY_TRACT | Status: DC
  Administered 2016-08-18 (×2): 2 via RESPIRATORY_TRACT
  Filled 2016-08-18: qty 8.8

## 2016-08-18 MED ORDER — IPRATROPIUM-ALBUTEROL 0.5-2.5 (3) MG/3ML IN SOLN
3.0000 mL | Freq: Four times a day (QID) | RESPIRATORY_TRACT | Status: DC
  Administered 2016-08-18 – 2016-08-19 (×5): 3 mL via RESPIRATORY_TRACT
  Filled 2016-08-18 (×5): qty 3

## 2016-08-18 MED ORDER — LEVOFLOXACIN 500 MG PO TABS
500.0000 mg | ORAL_TABLET | Freq: Once | ORAL | Status: AC
  Administered 2016-08-18: 500 mg via ORAL
  Filled 2016-08-18: qty 1

## 2016-08-18 MED ORDER — GABAPENTIN 400 MG PO CAPS
400.0000 mg | ORAL_CAPSULE | Freq: Three times a day (TID) | ORAL | Status: DC
  Administered 2016-08-18 – 2016-08-19 (×3): 400 mg via ORAL
  Filled 2016-08-18 (×3): qty 1

## 2016-08-18 MED ORDER — POLYETHYLENE GLYCOL 3350 17 G PO PACK: 17.0000 g | PACK | Freq: Every day | ORAL | Status: DC | PRN

## 2016-08-18 MED ORDER — METHYLPREDNISOLONE SODIUM SUCC 125 MG IJ SOLR
60.0000 mg | Freq: Two times a day (BID) | INTRAMUSCULAR | Status: DC
  Administered 2016-08-18 – 2016-08-19 (×2): 60 mg via INTRAVENOUS
  Filled 2016-08-18 (×2): qty 2

## 2016-08-18 MED ORDER — LEVOFLOXACIN 500 MG PO TABS
500.0000 mg | ORAL_TABLET | Freq: Every day | ORAL | Status: DC
  Administered 2016-08-19: 500 mg via ORAL
  Filled 2016-08-18: qty 1

## 2016-08-18 NOTE — Progress Notes (Signed)
Pharmacy Antibiotic Note  Emeterio ReeveHarmon L Knack is a 61 y.o. male admitted on 08/13/2016 with respiratory failure secondary to COPD exacerbation. Pharmacy consulted to dose Levaquin  Plan: Patient has completed 5 days of azithromycin, per MD still has active infection/inflammation, would like to treat with levaquin.   Levaquin 500mg  PO daily x 5 d  Height: 6' (182.9 cm) Weight: 193 lb 14.4 oz (88 kg) IBW/kg (Calculated) : 77.6  Temp (24hrs), Avg:98.1 F (36.7 C), Min:97.9 F (36.6 C), Max:98.3 F (36.8 C)   Recent Labs Lab 08/13/16 2228 08/13/16 2322 08/15/16 1014  WBC 23.3*  --   --   CREATININE  --  0.56* 0.69    Estimated Creatinine Clearance: 106.4 mL/min (by C-G formula based on SCr of 0.69 mg/dL).    No Known Allergies  Antimicrobials this admission: azithromycin 12/29 >> 1/2 levofloxacin 1/3 >>   Dose adjustments this admission:   Microbiology results: 12/29 Blood x2: NG final  Thank you for allowing pharmacy to be a part of this patient's care.  Jalecia Leon C 08/18/2016 3:51 PM

## 2016-08-18 NOTE — Progress Notes (Signed)
[facility] [place] at [facility]   PATIENT NAME: Kristopher Powell    MR#:  [id number]  DATE OF BIRTH:  Jul 15, 1956  SUBJECTIVE:  CHIEF COMPLAINT:   Chief Complaint  Patient presents with  . Respiratory Distress   - Admitted for COPD exacerbation. Chronically on 3 L oxygen, here on 4 L now. -Appears very sleepy today  REVIEW OF SYSTEMS:  Review of Systems  Constitutional: Positive for malaise/fatigue. Negative for chills and fever.  HENT: Negative for congestion, hearing loss, nosebleeds and tinnitus.   Eyes: Negative for blurred vision and double vision.  Respiratory: Positive for cough and shortness of breath. Negative for wheezing.   Cardiovascular: Negative for chest pain, palpitations and leg swelling.  Gastrointestinal: Negative for abdominal pain, constipation, diarrhea, nausea and vomiting.  Genitourinary: Negative for dysuria.  Musculoskeletal: Positive for back pain.  Skin: Negative for rash.  Neurological: Negative for dizziness, speech change, focal weakness, seizures and headaches.    DRUG ALLERGIES:  No Known Allergies  VITALS:  Blood pressure 128/85, pulse 97, temperature 98.2 F (36.8 C), temperature source Oral, resp. rate 14, height 6' (1.829 m), weight 88 kg (193 lb 14.4 oz), SpO2 94 %.  PHYSICAL EXAMINATION:  Physical Exam  GENERAL:  61 y.o.-year-old patient lying in the bed with no acute distress. Sleepy but easily arousable EYES: Pupils equal, round, reactive to light and accommodation. No scleral icterus. Extraocular muscles intact.  HEENT: Head atraumatic, normocephalic. Oropharynx and nasopharynx clear.  NECK:  Supple, no jugular venous distention. No thyroid enlargement, no tenderness.  LUNGS: Normal breath sounds bilaterally, but has scattered wheezing posteriorly at the bases, no rales,rhonchi or crepitation. No use of accessory muscles of respiration.  CARDIOVASCULAR: S1, S2 normal. No murmurs, rubs, or gallops.  ABDOMEN:  Soft, nontender, nondistended. Bowel sounds present. No organomegaly or mass.  EXTREMITIES: No pedal edema, cyanosis, or clubbing.  NEUROLOGIC: Cranial nerves II through XII are intact. Following simple commands. Gait not tested. PSYCHIATRIC: The patient is alert and oriented to self, sleepy and arousable.Marland Kitchen  SKIN: No obvious rash, lesion, or ulcer.    LABORATORY PANEL:   CBC  Recent Labs Lab 08/13/16 2228  WBC 23.3*  HGB 13.1  HCT 38.4*  PLT 483*   ------------------------------------------------------------------------------------------------------------------  Chemistries   Recent Labs Lab 08/15/16 1014  NA 136  K 4.2  CL 97*  CO2 33*  GLUCOSE 177*  BUN 10  CREATININE 0.69  CALCIUM 8.9   ------------------------------------------------------------------------------------------------------------------  Cardiac Enzymes  Recent Labs Lab 08/14/16 2236  TROPONINI 0.03*   ------------------------------------------------------------------------------------------------------------------  RADIOLOGY:  Dg Chest 1 View  Result Date: 08/16/2016 CLINICAL DATA:  Cough, shortness of breath, and wheezing.  COPD. EXAM: CHEST 1 VIEW COMPARISON:  08/13/2016 FINDINGS: Heart size and pulmonary vascularity remain normal. The lungs are hyperinflated consistent with COPD. Peribronchial thickening. Pulmonary vascularity is normal. Old healed left posterolateral rib fractures. IMPRESSION: Bronchitic changes have improved since the prior study. No acute infiltrates. COPD. Electronically Signed   By: Francene Boyers M.D.   On: 08/16/2016 14:34   Ct Chest W Contrast  Result Date: 08/17/2016 CLINICAL DATA:  Shortness of breath, COPD exacerbation. Cough and wheezing. History of COPD, hypertension, myocardial infarction, CHF. EXAM: CT CHEST WITH CONTRAST TECHNIQUE: Multidetector CT imaging of the chest was performed during intravenous contrast administration. CONTRAST:  75mL ISOVUE-300 IOPAMIDOL  (ISOVUE-300) INJECTION 61% COMPARISON:  Chest radiograph August 16, 2016 at 1401 hours and CT chest February 07, 2011 FINDINGS: CARDIOVASCULAR: Heart size is normal. Mild  coronary artery calcifications. No pericardial effusions. 4.2 cm ascending aorta (previously 3.6 cm June 2012) with irregular intimal thickening and mild calcific atherosclerosis at the aortic arch. MEDIASTINUM/NODES: No mediastinal mass. No lymphadenopathy by CT size criteria. Precarinal, subcarinal and aortopulmonary window subcentimeter reniform lymph nodes, likely reactive. Normal appearance of thoracic esophagus though not tailored for evaluation. LUNGS/PLEURA: Tracheobronchial tree is patent, no pneumothorax. Mild bronchial wall thickening. Mild centrilobular emphysema, RIGHT greater than LEFT apical bullous changes. 7 mm calcified granuloma RIGHT middle lobe and RIGHT upper lobe. Ground-glass opacities RIGHT upper lobe. Focal tree-in-bud infiltrates lingula. Scattered tiny centrilobular ground-glass nodules. Increased lung volumes. No pleural effusion or focal consolidation. UPPER ABDOMEN: Nonacute.  Status post cholecystectomy. MUSCULOSKELETAL: Nondisplaced healing RIGHT anterior sixth, seventh and eighth rib fractures. Minimal gynecomastia. Mild degenerative change of thoracic spine. IMPRESSION: Scattered ground-glass opacities and tree-in-bud infiltrates may be infectious or inflammatory in a background of chronic bronchitis. No focal consolidation. Mild emphysema and apical bullous changes. Healing nondisplaced RIGHT anterior rib fractures. **An incidental finding of potential clinical significance has been found. 4.2 cm aneurysmal ascending aorta. Recommend annual imaging followup by CTA or MRA. This recommendation follows 2010 ACCF/AHA/AATS/ACR/ASA/SCA/SCAI/SIR/STS/SVM Guidelines for the Diagnosis and Management of Patients with Thoracic Aortic Disease. Circulation. 2010; 121: Z610-R604: [id number]** Electronically Signed   By: Awilda Metroourtnay  Bloomer M.D.    On: 08/17/2016 22:35    EKG:   Orders placed or performed during the hospital encounter of 08/13/16  . EKG 12-Lead  . EKG 12-Lead  . ED EKG  . ED EKG    ASSESSMENT AND PLAN:   61 year old male with past medical history significant for COPD on 3 L home oxygen, CAD, CHF, hypertension and diabetes mellitus who was recently discharged from the hospital comes back again secondary to worsening shortness of breath.  #1 acute on chronic respiratory failure-secondary to COPD exacerbation. -On BiPAP initially, now 14 L oxygen. -Continue to decrease dose steroids. Received azithromycin for 5 days. However CT chest with still infectious/inflammatory bronchitis. Will start Levaquin. Significant COPD noted.  #2 HTN- on Norvasc and metoprolol.  #3 peripheral neuropathy-on gabapentin-decrease the dose as patient seems to be extremely sleepy.  #4 anxiety-discontinued Valium again as patient is sleepy today  #5 tobacco use disorder-strongly counseled. On nicotine patch  #6 DVT prophylaxis-on Lovenox   Encourage to work with physical therapy today.  All the records are reviewed and case discussed with Care Management/Social Workerr. Management plans discussed with the patient, family and they are in agreement.  CODE STATUS: Full code  TOTAL TIME TAKING CARE OF THIS PATIENT: 37 minutes.   POSSIBLE D/C IN 1-2 DAYS, DEPENDING ON CLINICAL CONDITION.   Enid BaasKALISETTI,Mahaley Schwering M.D on 08/18/2016 at 11:58 AM  Between 7am to 6pm - Pager - [phone]  After 6pm go to [email or web address] - Social research officer, governmentpassword [email or web address]  [facility]  Office  [phone]  CC: Primary care physician; Candie ChromanBARZIN, AMIR HOMAYOUN, DO

## 2016-08-18 NOTE — Evaluation (Signed)
Physical Therapy Evaluation Patient Details Name: Kristopher ReeveHarmon L Powell MRN: [id number] DOB: 11/18/1955 Today's Date: 08/18/2016   History of Present Illness  61 y/o male with acute on chronic respiratory failure was admitted, has bronchitis and hypercapnia.  PMHx:  MI, CAD, CHF, HTN, DM, peripheral neuropathy, anxiety, emphysema, old R side rib fractures, on 3L O2 at home  Clinical Impression  Pt is in bed with limited mobility and did note a small decline in O2 sats with effort.  He is still on 4L O2 and will expect to be discharged on O2.  He is not interested in any therapy after hospital and should be at baseline by then.  Did walk a very short trip today and needs to be OOB more with more frequent walks esp including nursing in the acute end of mobility.  Will follow acutely to increase endurance and bring him to levels from 2 weeks ago.      Follow Up Recommendations No PT follow up    Equipment Recommendations  None recommended by PT    Recommendations for Other Services       Precautions / Restrictions Precautions Precautions: Fall Precaution Comments: on 4L O2 via nasal cannula Restrictions Weight Bearing Restrictions: No      Mobility  Bed Mobility Overal bed mobility: Independent             General bed mobility comments: has fairly elevated bed and gets in with bed leveled out  Transfers Overall transfer level: Modified independent Equipment used: Rolling walker (2 wheeled)                Ambulation/Gait Ambulation/Gait assistance: Min guard Ambulation Distance (Feet): 40 Feet Assistive device: Rolling walker (2 wheeled) Gait Pattern/deviations: Step-through pattern;Wide base of support;Trunk flexed (SOB quickly with activity) Gait velocity: reduced Gait velocity interpretation: Below normal speed for age/gender General Gait Details: O2 sats down to 88% with activity then recovered to 91%  Stairs            Wheelchair Mobility    Modified  Rankin (Stroke Patients Only)       Balance Overall balance assessment: Modified Independent                                           Pertinent Vitals/Pain Pain Assessment: No/denies pain    Home Living Family/patient expects to be discharged to:: Private residence Living Arrangements: Alone Available Help at Discharge: Family;Other (Comment) (pt not sure if son can be helpful) Type of Home: House Home Access: Level entry     Home Layout: One level Home Equipment: Wheelchair - power;Wheelchair - [employer]manual;Walker - 2 wheels      Prior Function Level of Independence: Independent with assistive device(s)         Comments: states he [place] all [place]work from the [place]     [employer] Dominance        Extremity/Trunk Assessment   Upper Extremity Assessment Upper Extremity Assessment: Overall WFL for tasks assessed    Lower Extremity Assessment Lower Extremity Assessment: Overall WFL for tasks assessed    Cervical / Trunk Assessment Cervical / Trunk Assessment: Normal  Communication   Communication: No difficulties  Cognition Arousal/Alertness: Awake/alert Behavior During Therapy: WFL for tasks assessed/performed Overall Cognitive Status: Within Functional Limits for tasks assessed  General Comments      Exercises     Assessment/Plan    PT Assessment Patient needs continued PT services  PT Problem List Decreased activity tolerance;Decreased balance;Decreased mobility;Decreased safety awareness;Cardiopulmonary status limiting activity          PT Treatment Interventions DME instruction;Gait training;Functional mobility training;Therapeutic activities;Therapeutic exercise;Balance training;Neuromuscular re-education;Patient/family education    PT Goals (Current goals can be found in the Care Plan section)  Acute Rehab PT Goals Patient Stated Goal: go home PT Goal Formulation: With patient Time For Goal Achievement:  08/25/16 Potential to Achieve Goals: Good    Frequency Min 2X/week   Barriers to discharge Decreased caregiver support home alone    Co-evaluation               End of Session Equipment Utilized During Treatment: Gait belt Activity Tolerance: Patient limited by fatigue Patient left: in bed;with call bell/phone within reach (declined to sit up in the chair) Nurse Communication: Mobility status         Time: 1250-1320 PT Time Calculation (min) (ACUTE ONLY): 30 min   Charges:   PT Evaluation $PT Eval Low Complexity: 1 Procedure PT Treatments $Gait Training: 8-22 mins   PT G CodesIvar Drape 09/08/2016, 3:16 PM   Samul Dada, PT MS Acute Rehab Dept. Number: [facility] [id number] and [facility] [phone]

## 2016-08-19 LAB — BASIC METABOLIC PANEL
ANION GAP: 7 (ref 5–15)
BUN: 18 mg/dL (ref 6–20)
CALCIUM: 8.8 mg/dL — AB (ref 8.9–10.3)
CHLORIDE: 99 mmol/L — AB (ref 101–111)
CO2: 30 mmol/L (ref 22–32)
Creatinine, Ser: 0.71 mg/dL (ref 0.61–1.24)
GFR calc non Af Amer: >60 mL/min (ref >60)
Glucose, Bld: 127 mg/dL — ABNORMAL HIGH (ref 65–99)
Potassium: 4.2 mmol/L (ref 3.5–5.1)
Sodium: 136 mmol/L (ref 135–145)

## 2016-08-19 LAB — CBC
HCT: 41.9 % (ref 40.0–52.0)
HEMOGLOBIN: 14.1 g/dL (ref 13.0–18.0)
MCH: 28.7 pg (ref 26.0–34.0)
MCHC: 33.6 g/dL (ref 32.0–36.0)
MCV: 85.5 fL (ref 80.0–100.0)
Platelets: 528 10*3/uL — ABNORMAL HIGH (ref 150–440)
RBC: 4.9 MIL/uL (ref 4.40–5.90)
RDW: 15.2 % — ABNORMAL HIGH (ref 11.5–14.5)
WBC: 25.3 10*3/uL — AB (ref 3.8–10.6)

## 2016-08-19 LAB — GLUCOSE, CAPILLARY
Glucose-Capillary: 149 mg/dL — ABNORMAL HIGH (ref 65–99)
Glucose-Capillary: 132 mg/dL — ABNORMAL HIGH (ref 65–99)

## 2016-08-19 MED ORDER — PREDNISONE 10 MG (21) PO TBPK: 10.0000 mg | ORAL_TABLET | Freq: Every day | ORAL | 0 refills | Status: DC

## 2016-08-19 MED ORDER — MOMETASONE FURO-FORMOTEROL FUM 200-5 MCG/ACT IN AERO: 2.0000 | INHALATION_SPRAY | Freq: Two times a day (BID) | RESPIRATORY_TRACT | 1 refills | Status: DC

## 2016-08-19 MED ORDER — IPRATROPIUM-ALBUTEROL 0.5-2.5 (3) MG/3ML IN SOLN: 3.0000 mL | Freq: Four times a day (QID) | RESPIRATORY_TRACT | 0 refills | Status: AC

## 2016-08-19 MED ORDER — DIAZEPAM 10 MG PO TABS: 5.0000 mg | ORAL_TABLET | Freq: Two times a day (BID) | ORAL | 0 refills | Status: DC | PRN

## 2016-08-19 MED ORDER — GUAIFENESIN ER 600 MG PO TB12: 600.0000 mg | ORAL_TABLET | Freq: Two times a day (BID) | ORAL | 0 refills | Status: DC

## 2016-08-19 MED ORDER — LEVOFLOXACIN 500 MG PO TABS: 500.0000 mg | ORAL_TABLET | Freq: Every day | ORAL | 0 refills | Status: DC

## 2016-08-19 NOTE — Care Management (Signed)
No physical therapy follow up recommended.  Barrier- IV steroids.  oral levaquin added to treatment regime

## 2016-08-19 NOTE — Progress Notes (Signed)
New Transfer Note:   Arrival Method: per bed from 2A Mental Orientation: alert and oriented X4 Telemetry: discontinued per Nsg Supervisor Helmut MusterAlicia, Dr Sheryle Hailiamond discontinued order in Epic Assessment: Completed Skin: warm, dry, with scattered bruises noted on all extremities, with laceration noted on the right thumb dressed with adhesive bandage. Pain: denies having any pain at this time Tubes: O2 inhalation at 4Lpm Safety Measures: Safety Fall Prevention Plan has been given and discussed Admission: Completed 1A Orientation: Patient has been oriented to the room, unit and staff.  Family: no family member around at bedside  Orders have been reviewed and implemented. Will continue to monitor patient. Call light has been placed within reach and bed alarm has been activated.   Janice NorrieAnessa Lavilla Delamora BSN, RN [facility] 1A

## 2016-08-19 NOTE — Progress Notes (Signed)
Assessment completedat 0745. Pt is easily awakened, o2 changed from 4lnc to 3lnc, pt is mouth breathing until awake. When awake, pt denied pain, denied sob. Lungs are clear bilat with aeration to bases, Hr is regular. Abdomen is soft, bs heard. Pt has urinal at bedside for prn use, ppp, no edema noted. PIV #20 intact to Rfa, piv #18 intact to R hand, sites are free of redness and swelling. Since assessment, Dr. Nemiah CommanderKalisetti has rounded, pt intended for d/c today.

## 2016-08-19 NOTE — Progress Notes (Signed)
Patient notified nursing staff that he did not bring a home oxygen tank to the hospital with him and no one to bring one for him. Nursing cannot discharge patient home without oxygen. This Clinical research associatewriter called the patient's oxygen provider, [employer],and the company will dispatch someone to the hospital to bring a tank so the patient can discharged home. Representative informed me that someone will be in route in an hour.

## 2016-08-19 NOTE — Plan of Care (Signed)
Problem: Phase I Progression Outcomes Goal: Progress activity as tolerated unless otherwise ordered Pt has met goals for discharge.

## 2016-08-19 NOTE — Progress Notes (Signed)
This writer removed piv's from pt's R arm at 1500, both with catheters intact, sites clean and dry, pt tolerated well. Pt was given his clothes, stated that he is unable to find a ride home, so a taxi voucher was procured. Pt then needed an O2 tank to take home, he did not bring one of his own into the Er with him. This was delivered by pt's home o2 company. Pt was dc'd via [place] to front entrance and taxi at 1655. This Clinical research associatewriter gave pt scripts for valium and for prednisone, and explained that other meds were emailed to pharmacy and will be ready for pick up. D/c instructions were reviewed.

## 2016-08-19 NOTE — Discharge Summary (Signed)
[facility] [place] [facility]   PATIENT NAME: Kristopher Powell    MR#:  [id number]  DATE OF BIRTH:  1955-10-02  DATE OF ADMISSION:  08/13/2016   ADMITTING PHYSICIAN: Arnaldo Natal, MD  DATE OF DISCHARGE: 08/19/2016  PRIMARY CARE PHYSICIAN: Candie Chroman, DO   ADMISSION DIAGNOSIS:   COPD exacerbation (HCC) [J44.1]  DISCHARGE DIAGNOSIS:   Active Problems:   Acute on chronic respiratory failure with hypoxia and hypercapnia (HCC)   SECONDARY DIAGNOSIS:   Past Medical History:  Diagnosis Date  . CHF (congestive heart failure) (HCC)   . COPD (chronic obstructive pulmonary disease) (HCC)   . Diabetes mellitus without complication (HCC)   . Hypertension   . MI (myocardial infarction) 2006    HOSPITAL COURSE:   61 year old male with past medical history significant for COPD on 3 L home oxygen, CAD, CHF, hypertension and diabetes mellitus who was recently discharged from the hospital comes back again secondary to worsening shortness of breath.  #1 acute on chronic respiratory failure-secondary to COPD exacerbation. -On BiPAP initially, now 4 L oxygen. On 3l o2 at baseline -received IV steroids, now on oral prednisone at discharge. Received azithromycin for 5 days. However CT chest with still infectious/inflammatory bronchitis. So on oral Levaquin.  -Significant COPD at baseline.  #2 HTN- on Norvasc and metoprolol.  #3 peripheral neuropathy-on gabapentin.  #4 anxiety- decreased Valium dose  #5 tobacco use disorder-strongly counseled.   Seems to be at baseline. Being discharged today  DISCHARGE CONDITIONS:   Guarded CONSULTS OBTAINED:    none  DRUG ALLERGIES:   No Known Allergies DISCHARGE MEDICATIONS:   Allergies as of 08/19/2016   No Known Allergies     Medication List    TAKE these medications   albuterol 108 (90 Base) MCG/ACT inhaler Commonly known as:  PROVENTIL HFA;VENTOLIN HFA Inhale 2 puffs into the lungs  every 6 (six) hours as needed for wheezing or shortness of breath.   amLODipine 5 MG tablet Commonly known as:  NORVASC Take 5 mg by mouth daily.   aspirin EC 325 MG tablet Take 325 mg by mouth daily.   diazepam 10 MG tablet Commonly known as:  VALIUM Take 0.5 tablets (5 mg total) by mouth every 12 (twelve) hours as needed for anxiety. What changed:  how much to take  when to take this   gabapentin 300 MG capsule Commonly known as:  NEURONTIN Take 1,200 mg by mouth 3 (three) times daily.   guaiFENesin 600 MG 12 hr tablet Commonly known as:  MUCINEX Take 1 tablet (600 mg total) by mouth 2 (two) times daily.   ipratropium-albuterol 0.5-2.5 (3) MG/3ML Soln Commonly known as:  DUONEB Take 3 mLs by nebulization every 6 (six) hours.   levofloxacin 500 MG tablet Commonly known as:  LEVAQUIN Take 1 tablet (500 mg total) by mouth daily. X 4 more doses Start taking on:  08/20/2016   metFORMIN 500 MG 24 hr tablet Commonly known as:  GLUCOPHAGE-XR Take 500 mg by mouth 2 (two) times daily.   mometasone-formoterol 200-5 MCG/ACT Aero Commonly known as:  DULERA Inhale 2 puffs into the lungs 2 (two) times daily.   NEXIUM 40 MG capsule Generic drug:  esomeprazole Take 40 mg by mouth daily.   predniSONE 10 MG (21) Tbpk tablet Commonly known as:  STERAPRED UNI-PAK 21 TAB Take 1 tablet (10 mg total) by mouth daily. 6 tabs PO x 1 day 5 tabs PO x 1 day 4 tabs  PO x 1 day 3 tabs PO x 1 day 2 tabs PO x 1 day 1 tab PO x 1 day and stop   TOPROL XL 25 MG 24 hr tablet Generic drug:  metoprolol succinate Take 25 mg by mouth daily.        DISCHARGE INSTRUCTIONS:   1. PCP follow-up in 1-2 weeks  DIET:   Cardiac diet  ACTIVITY:   Activity as tolerated  OXYGEN:   Home Oxygen: Yes.    Oxygen Delivery: 4 liters/min via Patient connected to nasal cannula oxygen  DISCHARGE LOCATION:   home   If you experience worsening of your admission symptoms, develop shortness of breath,  life threatening emergency, suicidal or homicidal thoughts you must seek medical attention immediately by calling 911 or calling your MD immediately  if symptoms less severe.  You Must read complete instructions/literature along with all the possible adverse reactions/side effects for all the Medicines you take and that have been prescribed to you. Take any new Medicines after you have completely understood and accpet all the possible adverse reactions/side effects.   Please note  You were cared for by a hospitalist during your hospital stay. If you have any questions about your discharge medications or the care you received while you were in the hospital after you are discharged, you can call the unit and asked to speak with the hospitalist on call if the hospitalist that took care of you is not available. Once you are discharged, your primary care physician will handle any further medical issues. Please note that NO REFILLS for any discharge medications will be authorized once you are discharged, as it is imperative that you return to your primary care physician (or establish a relationship with a primary care physician if you do not have one) for your aftercare needs so that they can reassess your need for medications and monitor your lab values.    On the day of Discharge:  VITAL SIGNS:   Blood pressure (!) 140/100, pulse (!) 103, temperature 97.8 F (36.6 C), temperature source Oral, resp. rate 16, height 6' (1.829 m), weight 92.2 kg (203 lb 4.8 oz), SpO2 92 %.  PHYSICAL EXAMINATION:    GENERAL:  61 y.o.-year-old patient lying in the bed with no acute distress. Sleepy but easily arousable EYES: Pupils equal, round, reactive to light and accommodation. No scleral icterus. Extraocular muscles intact.  HEENT: Head atraumatic, normocephalic. Oropharynx and nasopharynx clear.  NECK:  Supple, no jugular venous distention. No thyroid enlargement, no tenderness.  LUNGS: Normal breath sounds  bilaterally, but has scattered wheezing posteriorly at the bases, no rales,rhonchi or crepitation. No use of accessory muscles of respiration.  CARDIOVASCULAR: S1, S2 normal. No murmurs, rubs, or gallops.  ABDOMEN: Soft, nontender, nondistended. Bowel sounds present. No organomegaly or mass.  EXTREMITIES: No pedal edema, cyanosis, or clubbing.  NEUROLOGIC: Cranial nerves II through XII are intact. Following simple commands. Gait not tested. PSYCHIATRIC: The patient is alert and oriented to self, sleepy and arousable.Marland Kitchen.  SKIN: No obvious rash, lesion, or ulcer.   DATA REVIEW:   CBC  Recent Labs Lab 08/19/16 0514  WBC 25.3*  HGB 14.1  HCT 41.9  PLT 528*    Chemistries   Recent Labs Lab 08/19/16 0514  NA 136  K 4.2  CL 99*  CO2 30  GLUCOSE 127*  BUN 18  CREATININE 0.71  CALCIUM 8.8*     Microbiology Results  Results for orders placed or performed during the hospital encounter of  08/13/16  Blood culture (routine x 2)     Status: None   Collection Time: 08/13/16 11:22 PM  Result Value Ref Range Status   Specimen Description BLOOD RIGHT ARM  Final   Special Requests   Final    BOTTLES DRAWN AEROBIC AND ANAEROBIC ,   Culture NO GROWTH 5 DAYS  Final   Report Status 08/18/2016 FINAL  Final  Blood culture (routine x 2)     Status: None   Collection Time: 08/13/16 11:22 PM  Result Value Ref Range Status   Specimen Description BLOOD LEFT HAND  Final   Special Requests   Final    BOTTLES DRAWN AEROBIC AND ANAEROBIC ,   Culture NO GROWTH 5 DAYS  Final   Report Status 08/18/2016 FINAL  Final    RADIOLOGY:  No results found.   Management plans discussed with the patient, family and they are in agreement.  CODE STATUS:     Code Status Orders        Start     Ordered   08/14/16 1048  Full code  Continuous     08/14/16 1047    Code Status History    Date Active Date Inactive Code Status Order ID Comments User  Context   08/06/2016 11:40 PM 08/07/2016  4:34 PM Full Code [id number]  Tonye Royalty, DO Inpatient   05/23/2015 10:14 PM 05/25/2015  2:59 PM Full Code [id number]  Altamese Dilling, MD Inpatient      TOTAL TIME TAKING CARE OF THIS PATIENT: 37 minutes.    Enid Baas M.D on 08/19/2016 at 4:10 PM  Between 7am to 6pm - Pager - [phone]  After 6pm go to [email or web address] - Social research officer, government  [facility]  Office  [phone]  CC: Primary care physician; Candie Chroman, DO   Note: This dictation was prepared with Dragon dictation along with smaller phrase technology. Any transcriptional errors that result from this process are unintentional.

## 2016-09-05 ENCOUNTER — Encounter: Payer: Self-pay | Admitting: Emergency Medicine

## 2016-09-05 ENCOUNTER — Emergency Department: Payer: Medicaid Other

## 2016-09-05 ENCOUNTER — Inpatient Hospital Stay
Admission: EM | Admit: 2016-09-05 | Discharge: 2016-09-07 | DRG: 189 | Disposition: A | Discharge status: Home / Self Care | Payer: Medicaid Other | Attending: Internal Medicine | Admitting: Internal Medicine

## 2016-09-05 DIAGNOSIS — J209 Acute bronchitis, unspecified: Secondary | ICD-10-CM | POA: Diagnosis present

## 2016-09-05 DIAGNOSIS — Z590 Homelessness: Secondary | ICD-10-CM

## 2016-09-05 DIAGNOSIS — E119 Type 2 diabetes mellitus without complications: Secondary | ICD-10-CM | POA: Diagnosis present

## 2016-09-05 DIAGNOSIS — J9622 Acute and chronic respiratory failure with hypercapnia: Secondary | ICD-10-CM

## 2016-09-05 DIAGNOSIS — J449 Chronic obstructive pulmonary disease, unspecified: Secondary | ICD-10-CM | POA: Diagnosis present

## 2016-09-05 DIAGNOSIS — Z8249 Family history of ischemic heart disease and other diseases of the circulatory system: Secondary | ICD-10-CM | POA: Diagnosis not present

## 2016-09-05 DIAGNOSIS — I2510 Atherosclerotic heart disease of native coronary artery without angina pectoris: Secondary | ICD-10-CM | POA: Diagnosis present

## 2016-09-05 DIAGNOSIS — J441 Chronic obstructive pulmonary disease with (acute) exacerbation: Secondary | ICD-10-CM | POA: Diagnosis present

## 2016-09-05 DIAGNOSIS — Z955 Presence of coronary angioplasty implant and graft: Secondary | ICD-10-CM | POA: Diagnosis not present

## 2016-09-05 DIAGNOSIS — R6510 Systemic inflammatory response syndrome (SIRS) of non-infectious origin without acute organ dysfunction: Secondary | ICD-10-CM | POA: Diagnosis present

## 2016-09-05 DIAGNOSIS — F17210 Nicotine dependence, cigarettes, uncomplicated: Secondary | ICD-10-CM | POA: Diagnosis present

## 2016-09-05 DIAGNOSIS — Z7982 Long term (current) use of aspirin: Secondary | ICD-10-CM | POA: Diagnosis not present

## 2016-09-05 DIAGNOSIS — A419 Sepsis, unspecified organism: Secondary | ICD-10-CM

## 2016-09-05 DIAGNOSIS — Z7984 Long term (current) use of oral hypoglycemic drugs: Secondary | ICD-10-CM

## 2016-09-05 DIAGNOSIS — F17200 Nicotine dependence, unspecified, uncomplicated: Secondary | ICD-10-CM | POA: Diagnosis present

## 2016-09-05 DIAGNOSIS — E876 Hypokalemia: Secondary | ICD-10-CM | POA: Diagnosis present

## 2016-09-05 DIAGNOSIS — Z823 Family history of stroke: Secondary | ICD-10-CM | POA: Diagnosis not present

## 2016-09-05 DIAGNOSIS — I252 Old myocardial infarction: Secondary | ICD-10-CM | POA: Diagnosis not present

## 2016-09-05 DIAGNOSIS — R262 Difficulty in walking, not elsewhere classified: Secondary | ICD-10-CM

## 2016-09-05 DIAGNOSIS — J9600 Acute respiratory failure, unspecified whether with hypoxia or hypercapnia: Secondary | ICD-10-CM | POA: Diagnosis present

## 2016-09-05 DIAGNOSIS — R0602 Shortness of breath: Secondary | ICD-10-CM | POA: Diagnosis not present

## 2016-09-05 DIAGNOSIS — I509 Heart failure, unspecified: Secondary | ICD-10-CM | POA: Diagnosis present

## 2016-09-05 DIAGNOSIS — I110 Hypertensive heart disease with heart failure: Secondary | ICD-10-CM | POA: Diagnosis present

## 2016-09-05 DIAGNOSIS — J9621 Acute and chronic respiratory failure with hypoxia: Secondary | ICD-10-CM | POA: Diagnosis not present

## 2016-09-05 DIAGNOSIS — J069 Acute upper respiratory infection, unspecified: Secondary | ICD-10-CM

## 2016-09-05 LAB — CBC
HCT: 37.9 % — ABNORMAL LOW (ref 40.0–52.0)
HEMOGLOBIN: 13.2 g/dL (ref 13.0–18.0)
MCH: 29 pg (ref 26.0–34.0)
MCHC: 34.7 g/dL (ref 32.0–36.0)
MCV: 83.6 fL (ref 80.0–100.0)
Platelets: 318 10*3/uL (ref 150–440)
RBC: 4.54 MIL/uL (ref 4.40–5.90)
RDW: 15.6 % — AB (ref 11.5–14.5)
WBC: 14.5 10*3/uL — ABNORMAL HIGH (ref 3.8–10.6)

## 2016-09-05 LAB — LACTIC ACID, PLASMA
LACTIC ACID, VENOUS: 2.6 mmol/L — AB (ref 0.5–1.9)
Lactic Acid, Venous: 1.8 mmol/L (ref 0.5–1.9)
Lactic Acid, Venous: 3.8 mmol/L (ref 0.5–1.9)

## 2016-09-05 LAB — CBC WITH DIFFERENTIAL/PLATELET
Basophils Absolute: 0 10*3/uL (ref 0–0.1)
Basophils Relative: 0 %
EOS ABS: 0 10*3/uL (ref 0–0.7)
EOS PCT: 0 %
HCT: 42.5 % (ref 40.0–52.0)
Hemoglobin: 14.3 g/dL (ref 13.0–18.0)
LYMPHS ABS: 0.3 10*3/uL — AB (ref 1.0–3.6)
LYMPHS PCT: 3 %
MCH: 28.6 pg (ref 26.0–34.0)
MCHC: 33.7 g/dL (ref 32.0–36.0)
MCV: 84.6 fL (ref 80.0–100.0)
MONOS PCT: 5 %
Monocytes Absolute: 0.6 10*3/uL (ref 0.2–1.0)
Neutro Abs: 12.1 10*3/uL — ABNORMAL HIGH (ref 1.4–6.5)
Neutrophils Relative %: 92 %
PLATELETS: 343 10*3/uL (ref 150–440)
RBC: 5.02 MIL/uL (ref 4.40–5.90)
RDW: 15.7 % — ABNORMAL HIGH (ref 11.5–14.5)
WBC: 13.1 10*3/uL — ABNORMAL HIGH (ref 3.8–10.6)

## 2016-09-05 LAB — PROTIME-INR
INR: 0.96
PROTHROMBIN TIME: 12.8 s (ref 11.4–15.2)

## 2016-09-05 LAB — COMPREHENSIVE METABOLIC PANEL
ALBUMIN: 3.7 g/dL (ref 3.5–5.0)
ALT: 15 U/L — AB (ref 17–63)
AST: 26 U/L (ref 15–41)
Alkaline Phosphatase: 109 U/L (ref 38–126)
Anion gap: 9 (ref 5–15)
BILIRUBIN TOTAL: 0.7 mg/dL (ref 0.3–1.2)
BUN: 16 mg/dL (ref 6–20)
CALCIUM: 8.5 mg/dL — AB (ref 8.9–10.3)
CO2: 26 mmol/L (ref 22–32)
Chloride: 99 mmol/L — ABNORMAL LOW (ref 101–111)
Creatinine, Ser: 0.67 mg/dL (ref 0.61–1.24)
GFR calc Af Amer: >60 mL/min (ref >60)
GFR calc non Af Amer: >60 mL/min (ref >60)
GLUCOSE: 124 mg/dL — AB (ref 65–99)
Potassium: 3.8 mmol/L (ref 3.5–5.1)
SODIUM: 134 mmol/L — AB (ref 135–145)
TOTAL PROTEIN: 7.2 g/dL (ref 6.5–8.1)

## 2016-09-05 LAB — URINALYSIS, COMPLETE (UACMP) WITH MICROSCOPIC
BACTERIA UA: NONE SEEN
BILIRUBIN URINE: NEGATIVE
Glucose, UA: NEGATIVE mg/dL
Hgb urine dipstick: NEGATIVE
KETONES UR: NEGATIVE mg/dL
LEUKOCYTES UA: NEGATIVE
NITRITE: NEGATIVE
PH: 6 (ref 5.0–8.0)
Protein, ur: NEGATIVE mg/dL
SPECIFIC GRAVITY, URINE: 1.006 (ref 1.005–1.030)
SQUAMOUS EPITHELIAL / LPF: NONE SEEN

## 2016-09-05 LAB — INFLUENZA PANEL BY PCR (TYPE A & B)
Influenza A By PCR: NEGATIVE
Influenza B By PCR: NEGATIVE

## 2016-09-05 LAB — TROPONIN I: Troponin I: <0.03 ng/mL (ref <0.03)

## 2016-09-05 LAB — GLUCOSE, CAPILLARY: Glucose-Capillary: 193 mg/dL — ABNORMAL HIGH (ref 65–99)

## 2016-09-05 LAB — BRAIN NATRIURETIC PEPTIDE: B NATRIURETIC PEPTIDE 5: 31 pg/mL (ref 0.0–100.0)

## 2016-09-05 MED ORDER — ACETAMINOPHEN 325 MG PO TABS
650.0000 mg | ORAL_TABLET | Freq: Four times a day (QID) | ORAL | Status: DC | PRN
  Administered 2016-09-05 – 2016-09-07 (×3): 650 mg via ORAL
  Filled 2016-09-05 (×3): qty 2

## 2016-09-05 MED ORDER — AZITHROMYCIN 500 MG PO TABS
500.0000 mg | ORAL_TABLET | Freq: Every day | ORAL | Status: DC
  Administered 2016-09-06: 500 mg via ORAL
  Filled 2016-09-05: qty 1

## 2016-09-05 MED ORDER — IPRATROPIUM-ALBUTEROL 0.5-2.5 (3) MG/3ML IN SOLN
3.0000 mL | Freq: Four times a day (QID) | RESPIRATORY_TRACT | Status: DC
  Administered 2016-09-05 – 2016-09-07 (×8): 3 mL via RESPIRATORY_TRACT
  Filled 2016-09-05 (×9): qty 3

## 2016-09-05 MED ORDER — CEFEPIME-DEXTROSE 2 GM/50ML IV SOLR
2.0000 g | Freq: Three times a day (TID) | INTRAVENOUS | Status: DC
  Administered 2016-09-05 – 2016-09-06 (×2): 2 g via INTRAVENOUS
  Filled 2016-09-05 (×5): qty 50

## 2016-09-05 MED ORDER — SODIUM CHLORIDE 0.9% FLUSH
3.0000 mL | Freq: Two times a day (BID) | INTRAVENOUS | Status: DC
  Administered 2016-09-06 – 2016-09-07 (×3): 3 mL via INTRAVENOUS

## 2016-09-05 MED ORDER — ONDANSETRON HCL 4 MG PO TABS: 4.0000 mg | ORAL_TABLET | Freq: Four times a day (QID) | ORAL | Status: DC | PRN

## 2016-09-05 MED ORDER — ACETAMINOPHEN 650 MG RE SUPP
650.0000 mg | Freq: Four times a day (QID) | RECTAL | Status: DC | PRN
Start: 2016-09-05 — End: 2016-09-07

## 2016-09-05 MED ORDER — FUROSEMIDE 20 MG PO TABS
20.0000 mg | ORAL_TABLET | Freq: Every day | ORAL | Status: DC
  Administered 2016-09-06 – 2016-09-07 (×2): 20 mg via ORAL
  Filled 2016-09-05 (×2): qty 1

## 2016-09-05 MED ORDER — METFORMIN HCL ER 500 MG PO TB24: 500.0000 mg | ORAL_TABLET | Freq: Two times a day (BID) | ORAL | Status: DC

## 2016-09-05 MED ORDER — BISACODYL 10 MG RE SUPP: 10.0000 mg | Freq: Every day | RECTAL | Status: DC | PRN

## 2016-09-05 MED ORDER — IMIPRAMINE HCL 25 MG PO TABS
200.0000 mg | ORAL_TABLET | Freq: Every day | ORAL | Status: DC
  Administered 2016-09-05 – 2016-09-06 (×2): 200 mg via ORAL
  Filled 2016-09-05: qty 4
  Filled 2016-09-05 (×2): qty 8

## 2016-09-05 MED ORDER — HEPARIN SODIUM (PORCINE) 5000 UNIT/ML IJ SOLN
5000.0000 [IU] | Freq: Three times a day (TID) | INTRAMUSCULAR | Status: DC
  Administered 2016-09-05 – 2016-09-07 (×6): 5000 [IU] via SUBCUTANEOUS
  Filled 2016-09-05 (×6): qty 1

## 2016-09-05 MED ORDER — AZITHROMYCIN 500 MG PO TABS
500.0000 mg | ORAL_TABLET | Freq: Once | ORAL | Status: AC
  Administered 2016-09-05: 500 mg via ORAL
  Filled 2016-09-05: qty 1

## 2016-09-05 MED ORDER — MOMETASONE FURO-FORMOTEROL FUM 100-5 MCG/ACT IN AERO
2.0000 | INHALATION_SPRAY | Freq: Two times a day (BID) | RESPIRATORY_TRACT | Status: DC
  Administered 2016-09-05 – 2016-09-07 (×4): 2 via RESPIRATORY_TRACT
  Filled 2016-09-05: qty 8.8

## 2016-09-05 MED ORDER — ALBUTEROL SULFATE (2.5 MG/3ML) 0.083% IN NEBU
3.0000 mL | INHALATION_SOLUTION | Freq: Four times a day (QID) | RESPIRATORY_TRACT | Status: DC | PRN
  Filled 2016-09-05: qty 3

## 2016-09-05 MED ORDER — ONDANSETRON HCL 4 MG/2ML IJ SOLN: 4.0000 mg | Freq: Four times a day (QID) | INTRAMUSCULAR | Status: DC | PRN

## 2016-09-05 MED ORDER — INSULIN ASPART 100 UNIT/ML ~~LOC~~ SOLN
0.0000 [IU] | Freq: Three times a day (TID) | SUBCUTANEOUS | Status: DC
  Administered 2016-09-05 – 2016-09-06 (×3): 2 [IU] via SUBCUTANEOUS
  Administered 2016-09-06: 3 [IU] via SUBCUTANEOUS
  Administered 2016-09-07: 1 [IU] via SUBCUTANEOUS
  Filled 2016-09-05 (×2): qty 2
  Filled 2016-09-05: qty 3
  Filled 2016-09-05: qty 2
  Filled 2016-09-05: qty 1

## 2016-09-05 MED ORDER — ASPIRIN EC 325 MG PO TBEC
325.0000 mg | DELAYED_RELEASE_TABLET | Freq: Every day | ORAL | Status: DC
  Administered 2016-09-06 – 2016-09-07 (×2): 325 mg via ORAL
  Filled 2016-09-05 (×2): qty 1

## 2016-09-05 MED ORDER — SODIUM CHLORIDE 0.9 % IV SOLN
INTRAVENOUS | Status: DC
  Administered 2016-09-05: 19:00:00 via INTRAVENOUS

## 2016-09-05 MED ORDER — GUAIFENESIN ER 600 MG PO TB12
600.0000 mg | ORAL_TABLET | Freq: Two times a day (BID) | ORAL | Status: DC
  Administered 2016-09-05 – 2016-09-07 (×4): 600 mg via ORAL
  Filled 2016-09-05 (×4): qty 1

## 2016-09-05 MED ORDER — METOPROLOL SUCCINATE ER 25 MG PO TB24
25.0000 mg | ORAL_TABLET | Freq: Every day | ORAL | Status: DC
  Administered 2016-09-05 – 2016-09-06 (×2): 25 mg via ORAL
  Filled 2016-09-05 (×2): qty 1

## 2016-09-05 MED ORDER — DOCUSATE SODIUM 100 MG PO CAPS
100.0000 mg | ORAL_CAPSULE | Freq: Two times a day (BID) | ORAL | Status: DC
  Administered 2016-09-05 – 2016-09-06 (×2): 100 mg via ORAL
  Filled 2016-09-05 (×4): qty 1

## 2016-09-05 MED ORDER — AMLODIPINE BESYLATE 5 MG PO TABS
5.0000 mg | ORAL_TABLET | Freq: Every day | ORAL | Status: DC
  Administered 2016-09-06 – 2016-09-07 (×2): 5 mg via ORAL
  Filled 2016-09-05 (×2): qty 1

## 2016-09-05 MED ORDER — GUAIFENESIN ER 600 MG PO TB12: 600.0000 mg | ORAL_TABLET | Freq: Two times a day (BID) | ORAL | Status: DC

## 2016-09-05 MED ORDER — SODIUM CHLORIDE 0.9 % IV BOLUS (SEPSIS)
1000.0000 mL | Freq: Once | INTRAVENOUS | Status: AC
  Administered 2016-09-05: 1000 mL via INTRAVENOUS

## 2016-09-05 MED ORDER — CEFEPIME-DEXTROSE 1 GM/50ML IV SOLR
1.0000 g | Freq: Once | INTRAVENOUS | Status: AC
  Administered 2016-09-05: 1 g via INTRAVENOUS
  Filled 2016-09-05: qty 50

## 2016-09-05 MED ORDER — RISPERIDONE 1 MG PO TABS
4.0000 mg | ORAL_TABLET | Freq: Every day | ORAL | Status: DC
  Administered 2016-09-05 – 2016-09-06 (×2): 4 mg via ORAL
  Filled 2016-09-05: qty 4
  Filled 2016-09-05 (×2): qty 2
  Filled 2016-09-05: qty 4

## 2016-09-05 MED ORDER — DIAZEPAM 5 MG PO TABS
10.0000 mg | ORAL_TABLET | Freq: Three times a day (TID) | ORAL | Status: DC | PRN
Start: 2016-09-05 — End: 2016-09-07
  Administered 2016-09-05 – 2016-09-07 (×4): 10 mg via ORAL
  Filled 2016-09-05 (×4): qty 2

## 2016-09-05 MED ORDER — IPRATROPIUM-ALBUTEROL 0.5-2.5 (3) MG/3ML IN SOLN
3.0000 mL | Freq: Once | RESPIRATORY_TRACT | Status: AC
  Administered 2016-09-05: 3 mL via RESPIRATORY_TRACT
  Filled 2016-09-05: qty 3

## 2016-09-05 MED ORDER — ACETAMINOPHEN 500 MG PO TABS
1000.0000 mg | ORAL_TABLET | Freq: Once | ORAL | Status: AC
  Administered 2016-09-05: 1000 mg via ORAL
  Filled 2016-09-05: qty 2

## 2016-09-05 MED ORDER — GABAPENTIN 400 MG PO CAPS
1200.0000 mg | ORAL_CAPSULE | Freq: Three times a day (TID) | ORAL | Status: DC
  Administered 2016-09-05 – 2016-09-07 (×5): 1200 mg via ORAL
  Filled 2016-09-05 (×6): qty 3

## 2016-09-05 MED ORDER — METHYLPREDNISOLONE SODIUM SUCC 125 MG IJ SOLR
60.0000 mg | Freq: Four times a day (QID) | INTRAMUSCULAR | Status: DC
  Administered 2016-09-05 – 2016-09-06 (×3): 60 mg via INTRAVENOUS
  Filled 2016-09-05 (×3): qty 2

## 2016-09-05 MED ORDER — METHYLPREDNISOLONE SODIUM SUCC 125 MG IJ SOLR
125.0000 mg | Freq: Once | INTRAMUSCULAR | Status: AC
  Administered 2016-09-05: 125 mg via INTRAVENOUS
  Filled 2016-09-05: qty 2

## 2016-09-05 NOTE — ED Notes (Signed)
Per EMS and per family member [place] [facility]. Per family pt has no electricity, or plumbing. Son states meals on wheels comes to see pt for food. Family member concerned about living conditions and fathers condition. MD notified and case worker

## 2016-09-05 NOTE — Plan of Care (Signed)
Problem: Safety: Goal: Ability to remain free from injury will improve Outcome: Progressing Fall precautions in place  Problem: Pain Managment: Goal: General experience of comfort will improve Outcome: Progressing Prn medications  Problem: Physical Regulation: Goal: Will remain free from infection Outcome: Not Progressing IV antibiotics  Problem: Tissue Perfusion: Goal: Risk factors for ineffective tissue perfusion will decrease Outcome: Progressing SQ Heparin

## 2016-09-05 NOTE — H&P (Signed)
History and Physical    Kristopher ReeveHarmon L Schonberger WUJ:[id number] DOB: 01/01/1956 DOA: 09/05/2016  Referring physician: Dr. Don PerkingVeronese PCP: Candie ChromanBARZIN, AMIR HOMAYOUN, DO  Specialists: none  Chief Complaint: SOB  HPI: Kristopher Powell is a 61 y.o. male has a past medical history significant for HTN, DM, COPD, and CAD now with acute worsening of SOB and cough. In ER, pt was febrile and tachycardic. WBC elevated. CXR non-diagnostic. He denies CP. He remains markedly hypoxic. The pt is homeless and did not fill outpatient meds 2-3 weeks ago for pneumonia. He is now admitted.  Review of Systems: The patient denies anorexia, fever, weight loss,, vision loss, decreased hearing, hoarseness, chest pain, syncope,  peripheral edema, balance deficits, hemoptysis, abdominal pain, melena, hematochezia, severe indigestion/heartburn, hematuria, incontinence, genital sores, muscle weakness, suspicious skin lesions, transient blindness, difficulty walking, depression, unusual weight change, abnormal bleeding, enlarged lymph nodes, angioedema, and breast masses.   Past Medical History:  Diagnosis Date  . CHF (congestive heart failure) (HCC)   . COPD (chronic obstructive pulmonary disease) (HCC)   . Diabetes mellitus without complication (HCC)   . Hypertension   . MI (myocardial infarction) 2006   Past Surgical History:  Procedure Laterality Date  . APPENDECTOMY    . CHOLECYSTECTOMY    . CORONARY ANGIOPLASTY WITH STENT PLACEMENT     Social History:  reports that he has been smoking Cigarettes.  He has a 40.00 pack-year smoking history. He has never used smokeless tobacco. He reports that he drinks about 7.2 - 14.4 oz of alcohol per week . He reports that he does not use drugs.  No Known Allergies  Family History  Problem Relation Age of Onset  . CAD Mother     deceased 3958  . Stroke Father     deceased 173  . Heart attack Brother     deceased 2058    Prior to Admission medications   Medication Sig Start Date End  Date Taking? Authorizing Provider  albuterol (PROVENTIL HFA;VENTOLIN HFA) 108 (90 Base) MCG/ACT inhaler Inhale 2 puffs into the lungs every 6 (six) hours as needed for wheezing or shortness of breath.   Yes Historical Provider, MD  amLODipine (NORVASC) 5 MG tablet Take 5 mg by mouth daily.   Yes Historical Provider, MD  aspirin EC 325 MG tablet Take 325 mg by mouth daily.   Yes Historical Provider, MD  diazepam (VALIUM) 10 MG tablet Take 0.5 tablets (5 mg total) by mouth every 12 (twelve) hours as needed for anxiety. Patient taking differently: Take 10 mg by mouth 3 (three) times daily.  08/19/16  Yes Enid Baasadhika Kalisetti, MD  esomeprazole (NEXIUM) 40 MG capsule Take 40 mg by mouth daily.   Yes Historical Provider, MD  Fluticasone-Salmeterol (ADVAIR) 100-50 MCG/DOSE AEPB Inhale 1 puff into the lungs 2 (two) times daily.   Yes Historical Provider, MD  furosemide (LASIX) 20 MG tablet Take 20 mg by mouth daily.   Yes Historical Provider, MD  gabapentin (NEURONTIN) 300 MG capsule Take 1,200 mg by mouth 3 (three) times daily.    Yes Historical Provider, MD  guaiFENesin (MUCINEX) 600 MG 12 hr tablet Take 1 tablet (600 mg total) by mouth 2 (two) times daily. 08/19/16  Yes Enid Baasadhika Kalisetti, MD  imipramine (TOFRANIL) 50 MG tablet Take 200 mg by mouth at bedtime.   Yes Historical Provider, MD  ipratropium-albuterol (DUONEB) 0.5-2.5 (3) MG/3ML SOLN Take 3 mLs by nebulization every 6 (six) hours. 08/19/16  Yes Enid Baasadhika Kalisetti, MD  levofloxacin [facility](LEVAQUIN)  500 MG tablet Take 1 tablet (500 mg total) by mouth daily. X 4 more doses 08/20/16  Yes Enid Baas, MD  metFORMIN (GLUCOPHAGE-XR) 500 MG 24 hr tablet Take 500 mg by mouth 2 (two) times daily. 09/05/15  Yes Historical Provider, MD  metoprolol succinate (TOPROL XL) 25 MG 24 hr tablet Take 25 mg by mouth daily.   Yes Historical Provider, MD  mometasone-formoterol (DULERA) 200-5 MCG/ACT AERO Inhale 2 puffs into the lungs 2 (two) times daily. 08/19/16  Yes Enid Baas, MD  risperiDONE (RISPERDAL) 2 MG tablet Take 4 mg by mouth at bedtime.   Yes Historical Provider, MD  predniSONE (STERAPRED UNI-PAK 21 TAB) 10 MG (21) TBPK tablet Take 1 tablet (10 mg total) by mouth daily. 6 tabs PO x 1 day 5 tabs PO x 1 day 4 tabs PO x 1 day 3 tabs PO x 1 day 2 tabs PO x 1 day 1 tab PO x 1 day and stop Patient not taking: Reported on 09/05/2016 08/19/16   Enid Baas, MD   Physical Exam: Vitals:   09/05/16 1400 09/05/16 1435 09/05/16 1438 09/05/16 1500  BP:  125/70  125/84  Pulse: (!) 159 (!) 112  (!) 157  Resp: (!) 27 (!) 27  (!) 26  Temp:   99.8 F (37.7 C)   TempSrc:   Oral   SpO2: (!) 86% 94%  100%     General:  No apparent distress, WDWN, [place]/AT  Eyes: PERRL, EOMI, no scleral icterus, conjunctiva clear  ENT: moist oropharynx without exudate, TM's benign, dentition poor  Neck: supple, no lymphadenopathy. No bruits or thyromegaly  Cardiovascular: rapid rate with regular rhythm without MRG; 2+ peripheral pulses, no JVD, no peripheral edema  Respiratory: decreased breath sounds with diffuse wheezes and rhonchi. No dullness or rales. Respiratory effort increased  Abdomen: soft, non tender to palpation, positive bowel sounds, no guarding, no rebound  Skin: no rashes or lesions  Musculoskeletal: normal bulk and tone, no joint swelling  Psychiatric: normal mood and affect, A&OX3  Neurologic: CN 2-12 grossly intact, Motor strength 5/5 in all 4 groups with symmetric DTR's and non-focal sensory exam  Labs on Admission:  Basic Metabolic Panel:  Recent Labs Lab 09/05/16 1312  NA 134*  K 3.8  CL 99*  CO2 26  GLUCOSE 124*  BUN 16  CREATININE 0.67  CALCIUM 8.5*   Liver Function Tests:  Recent Labs Lab 09/05/16 1312  AST 26  ALT 15*  ALKPHOS 109  BILITOT 0.7  PROT 7.2  ALBUMIN 3.7   No results for input(s): LIPASE, AMYLASE in the last 168 hours. No results for input(s): AMMONIA in the last 168 hours. CBC:  Recent  Labs Lab 09/05/16 1312  WBC 13.1*  NEUTROABS 12.1*  HGB 14.3  HCT 42.5  MCV 84.6  PLT 343   Cardiac Enzymes: No results for input(s): CKTOTAL, CKMB, CKMBINDEX, TROPONINI in the last 168 hours.  BNP (last 3 results)  Recent Labs  08/13/16 2228  BNP 158.0*    ProBNP (last 3 results) No results for input(s): PROBNP in the last 8760 hours.  CBG: No results for input(s): GLUCAP in the last 168 hours.  Radiological Exams on Admission: Dg Chest 2 View  Result Date: 09/05/2016 CLINICAL DATA:  Shortness of breath EXAM: CHEST  2 VIEW COMPARISON:  CT chest 08/17/2016 FINDINGS: The lungs are hyperinflated likely secondary to COPD. There is no focal parenchymal opacity. There is no pleural effusion or pneumothorax. The heart and mediastinal  contours are unremarkable. The osseous structures are unremarkable. IMPRESSION: No active cardiopulmonary disease. Electronically Signed   By: Elige Ko   On: 09/05/2016 14:47    EKG: Independently reviewed.  Assessment/Plan Principal Problem:   Acute on chronic respiratory failure with hypoxia and hypercapnia (HCC) Active Problems:   COPD (chronic obstructive pulmonary disease) (HCC)   SIRS (systemic inflammatory response syndrome) (HCC)   Acute URI   Will admit to floor with O2, IV steroids, IV ABX, and SVN's. Follow sugars. Conitnue outpt meds for now. Wean O2 as tolerated. Consider chest CT if no improvement. CSW consult for disposition given pt's homeless state.  Diet: low carb Fluids: NS@75  DVT Prophylaxis: SQ Heparin  Code Status: FULL  Family Communication: none  Disposition Plan: home  Time spent: 55 min

## 2016-09-05 NOTE — Progress Notes (Signed)
61 yo wm admitted to room 260 from ED with cough, increased SOB.  A&Ox3, gait unsteady.  No distress on 4LO2 per [place].  Pt uses home oxygen, pt appears disheveled, nails/toenails extremely long and unkempt.  Lungs diminished lower lobes bil.  Cardiac monitor placed on pt and verified with Dorene GrebeNatalie, CNA.  Pt denies chest pain. SL rt wrist/rt ac flushes well.  Skin intact, verified with Tammy RN.  Oriented to room and surroundings, POC reviewed with pt.  Denies need at this time.  CB in reach, SR up x 2, bed alarm on.

## 2016-09-05 NOTE — ED Notes (Signed)
Pt on 4L [place] chronic, pt placed on o2

## 2016-09-05 NOTE — Progress Notes (Signed)
ANTIBIOTIC CONSULT NOTE - INITIAL  Pharmacy Consult for Cefepime dosing Indication: pneumonia  No Known Allergies  Patient Measurements: Weight: 254 lb (115.2 kg) Adjusted Body Weight: 93 kg  Vital Signs: Temp: 99.8 F (37.7 C) (01/21 1438) Temp Source: Oral (01/21 1438) BP: 130/62 (01/21 1630) Pulse Rate: 101 (01/21 1630) Intake/Output from previous day: No intake/output data recorded. Intake/Output from this shift: Total I/O In: -  Out: 500 [Urine:500]  Labs:  Recent Labs  09/05/16 1312  WBC 13.1*  HGB 14.3  PLT 343  CREATININE 0.67   Estimated Creatinine Clearance: 127 mL/min (by C-G formula based on SCr of 0.67 mg/dL). No results for input(s): VANCOTROUGH, VANCOPEAK, VANCORANDOM, GENTTROUGH, GENTPEAK, GENTRANDOM, TOBRATROUGH, TOBRAPEAK, TOBRARND, AMIKACINPEAK, AMIKACINTROU, AMIKACIN in the last 72 hours.   Microbiology: Recent Results (from the past 720 hour(s))  Blood culture (routine x 2)     Status: None   Collection Time: 08/13/16 11:22 PM  Result Value Ref Range Status   Specimen Description BLOOD RIGHT ARM  Final   Special Requests   Final    BOTTLES DRAWN AEROBIC AND ANAEROBIC 13MLAEROBIC, 13MLANAEROBIC   Culture NO GROWTH 5 DAYS  Final   Report Status 08/18/2016 FINAL  Final  Blood culture (routine x 2)     Status: None   Collection Time: 08/13/16 11:22 PM  Result Value Ref Range Status   Specimen Description BLOOD LEFT HAND  Final   Special Requests   Final    BOTTLES DRAWN AEROBIC AND ANAEROBIC 14MLAEROBIC, 12MLANAEROBIC   Culture NO GROWTH 5 DAYS  Final   Report Status 08/18/2016 FINAL  Final    Medical History: Past Medical History:  Diagnosis Date  . CHF (congestive heart failure) (HCC)   . COPD (chronic obstructive pulmonary disease) (HCC)   . Diabetes mellitus without complication (HCC)   . Hypertension   . MI (myocardial infarction) 2006    Medications:  Scheduled:  . [START ON 09/06/2016] azithromycin  500 mg Oral Daily  .  guaiFENesin  600 mg Oral BID  . insulin aspart  0-9 Units Subcutaneous TID WC  . ipratropium-albuterol  3 mL Nebulization QID  . methylPREDNISolone (SOLU-MEDROL) injection  60 mg Intravenous Q6H   Assessment: Patient admitted for PNA w/ pharmacy consult to dose cefepime. CrCl 127 ml/min TBW 115.2  Plan:  Will dose cefepime 2g q8h and continue to monitor renal function and cbc. Follow up culture results  Thank you for this consult.  Thomasene Rippleavid Breklyn Fabrizio, PharmD, BCPS Clinical Pharmacist 09/05/2016

## 2016-09-05 NOTE — ED Triage Notes (Signed)
Pt to ED via EMS from home, c/o SOB. Per EMS pt was SOB and o2 sats upper 70s on RA, HR 120s. Pt states was seen last week with pneumonia but left AMA. Pt A&Ox4

## 2016-09-05 NOTE — ED Provider Notes (Signed)
[facility] Emergency Department Provider Note  ____________________________________________  Time seen: Approximately 1:27 PM  I have reviewed the triage vital signs and the nursing notes.   HISTORY  Chief Complaint Shortness of Breath   HPI Kristopher Powell is a 61 y.o. male with a history of CHF, COPD on 4 L nasal cannula, smoking, diabetes, CAD status post MI, hypertension who presents for evaluation of shortness of breath and fever. Patient reports one week of progressively worsening shortness of breath much worse today. He has had wheezing and a cough that is productive. He does not know the color of the phlegm as he is swallowing it.He started to have fevers yesterday and chills. No nausea or vomiting, no diarrhea, no abdominal pain. Patient reports mild chest discomfort that is worse when he coughs and improves when he is on oxygen. No chest pain at this time. He describes it as a tightness located in the center of his chest and nonradiating. Patient was recently admitted the beginning of the month for pneumonia and left AMA. Never filled his prescriptions for antibiotics. Has not received his flu shot. He continues to smoke.  Past Medical History:  Diagnosis Date  . CHF (congestive heart failure) (HCC)   . COPD (chronic obstructive pulmonary disease) (HCC)   . Diabetes mellitus without complication (HCC)   . Hypertension   . MI (myocardial infarction) 2006    Patient Active Problem List   Diagnosis Date Noted  . Acute on chronic respiratory failure with hypoxia and hypercapnia (HCC) 08/14/2016  . Chest pain 08/07/2016  . Dehydration with hyponatremia 08/06/2016  . Chest pain, rule out acute myocardial infarction 08/06/2016  . CHF (congestive heart failure) (HCC)   . Generalized anxiety disorder 08/14/2015  . Benzodiazepine abuse 08/14/2015  . Acute delirium 08/14/2015  . Diabetes (HCC) 08/14/2015  . COPD (chronic obstructive pulmonary disease)  (HCC) 08/14/2015  . Suicidal ideation 08/14/2015  . Hyponatremia 05/23/2015  . Hypokalemia 05/23/2015    Past Surgical History:  Procedure Laterality Date  . APPENDECTOMY    . CHOLECYSTECTOMY    . CORONARY ANGIOPLASTY WITH STENT PLACEMENT      Prior to Admission medications   Medication Sig Start Date End Date Taking? Authorizing Provider  albuterol (PROVENTIL HFA;VENTOLIN HFA) 108 (90 Base) MCG/ACT inhaler Inhale 2 puffs into the lungs every 6 (six) hours as needed for wheezing or shortness of breath.    Historical Provider, MD  amLODipine (NORVASC) 5 MG tablet Take 5 mg by mouth daily.    Historical Provider, MD  aspirin EC 325 MG tablet Take 325 mg by mouth daily.    Historical Provider, MD  diazepam (VALIUM) 10 MG tablet Take 0.5 tablets (5 mg total) by mouth every 12 (twelve) hours as needed for anxiety. 08/19/16   Enid Baas, MD  esomeprazole (NEXIUM) 40 MG capsule Take 40 mg by mouth daily.    Historical Provider, MD  gabapentin (NEURONTIN) 300 MG capsule Take 1,200 mg by mouth 3 (three) times daily.     Historical Provider, MD  guaiFENesin (MUCINEX) 600 MG 12 hr tablet Take 1 tablet (600 mg total) by mouth 2 (two) times daily. 08/19/16   Enid Baas, MD  ipratropium-albuterol (DUONEB) 0.5-2.5 (3) MG/3ML SOLN Take 3 mLs by nebulization every 6 (six) hours. 08/19/16   Enid Baas, MD  levofloxacin (LEVAQUIN) 500 MG tablet Take 1 tablet (500 mg total) by mouth daily. X 4 more doses 08/20/16   Enid Baas, MD  metFORMIN [facility])  500 MG 24 hr tablet Take 500 mg by mouth 2 (two) times daily. 09/05/15   Historical Provider, MD  metoprolol succinate (TOPROL XL) 25 MG 24 hr tablet Take 25 mg by mouth daily.    Historical Provider, MD  mometasone-formoterol (DULERA) 200-5 MCG/ACT AERO Inhale 2 puffs into the lungs 2 (two) times daily. 08/19/16   Enid Baasadhika Kalisetti, MD  predniSONE (STERAPRED UNI-PAK 21 TAB) 10 MG (21) TBPK tablet Take 1 tablet (10 mg total) by mouth daily.  6 tabs PO x 1 day 5 tabs PO x 1 day 4 tabs PO x 1 day 3 tabs PO x 1 day 2 tabs PO x 1 day 1 tab PO x 1 day and stop 08/19/16   Enid Baasadhika Kalisetti, MD    Allergies Patient has no known allergies.  Family History  Problem Relation Age of Onset  . CAD Mother     deceased 4558  . Stroke Father     deceased 4973  . Heart attack Brother     deceased 1858    Social History Social History  Substance Use Topics  . Smoking status: Current Every Day Smoker    Packs/day: 1.00    Years: 40.00    Types: Cigarettes  . Smokeless tobacco: Never Used  . Alcohol use 7.2 - 14.4 oz/week    12 - 24 Cans of beer per week     Comment: occ    Review of Systems  Constitutional: Negative for fever. Eyes: Negative for visual changes. ENT: Negative for sore throat. Neck: No neck pain  Cardiovascular: Negative for chest pain. Respiratory: + shortness of breath, cough, wheezinhg Gastrointestinal: Negative for abdominal pain, vomiting or diarrhea. Genitourinary: Negative for dysuria. Musculoskeletal: Negative for back pain. Skin: Negative for rash. Neurological: Negative for headaches, weakness or numbness. Psych: No SI or HI  ____________________________________________   PHYSICAL EXAM:  VITAL SIGNS: ED Triage Vitals [09/05/16 1306]  Enc Vitals Group     BP 119/78     Pulse Rate (!) 124     Resp      Temp (!) 100.6 F (38.1 C)     Temp Source Oral     SpO2 100 %     Weight      Height      Head Circumference      Peak Flow      Pain Score 4     Pain Loc      Pain Edu?      Excl. in GC?     Constitutional: Alert and oriented. Diaphoretic and no distress. HEENT:      Head: Normocephalic and atraumatic.         Eyes: Conjunctivae are normal. Sclera is non-icteric. EOMI. PERRL      Mouth/Throat: Mucous membranes are moist.       Neck: Supple with no signs of meningismus. Cardiovascular: Tachycardic with regular rhythm. No murmurs, gallops, or rubs. 2+ symmetrical distal pulses are  present in all extremities. No JVD. Respiratory: Normal respiratory effort, patient is satting 100% 4 L which is his baseline, mildly decreased air movement with no wheezing or crackles  Gastrointestinal: Soft, non tender, and non distended with positive bowel sounds. No rebound or guarding. Musculoskeletal: Nontender with normal range of motion in all extremities. No edema, cyanosis, or erythema of extremities. Neurologic: Normal speech and language. Face is symmetric. Moving all extremities. No gross focal neurologic deficits are appreciated. Skin: Skin is warm, dry and intact. No rash noted. Psychiatric: Mood  and affect are normal. Speech and behavior are normal.  ____________________________________________   LABS (all labs ordered are listed, but only abnormal results are displayed)  Labs Reviewed  COMPREHENSIVE METABOLIC PANEL - Abnormal; Notable for the following:       Result Value   Sodium 134 (*)    Chloride 99 (*)    Glucose, Bld 124 (*)    Calcium 8.5 (*)    ALT 15 (*)    All other components within normal limits  CBC WITH DIFFERENTIAL/PLATELET - Abnormal; Notable for the following:    WBC 13.1 (*)    RDW 15.7 (*)    Neutro Abs 12.1 (*)    Lymphs Abs 0.3 (*)    All other components within normal limits  CULTURE, BLOOD (ROUTINE X 2)  CULTURE, BLOOD (ROUTINE X 2)  URINE CULTURE  LACTIC ACID, PLASMA  PROTIME-INR  INFLUENZA PANEL BY PCR (TYPE A & B)  LACTIC ACID, PLASMA  URINALYSIS, COMPLETE (UACMP) WITH MICROSCOPIC  BRAIN NATRIURETIC PEPTIDE   ____________________________________________  EKG  ED ECG REPORT I, Nita Sickle, the attending physician, personally viewed and interpreted this ECG.  Sinus tachycardia, rate of 124, normal intervals, normal axis, ST depression on leads V5 and V6, no ST elevations. Unchanged from prior ____________________________________________  RADIOLOGY  CXR: No active cardiopulmonary  disease.____________________________________________   PROCEDURES  Procedure(s) performed:None Procedures Critical Care performed: yes  CRITICAL CARE Performed by: Nita Sickle  ?  Total critical care time: 35 min  Critical care time was exclusive of separately billable procedures and treating other patients.  Critical care was necessary to treat or prevent imminent or life-threatening deterioration.  Critical care was time spent personally by me on the following activities: development of treatment plan with patient and/or surrogate as well as nursing, discussions with consultants, evaluation of patient's response to treatment, examination of patient, obtaining history from patient or surrogate, ordering and performing treatments and interventions, ordering and review of laboratory studies, ordering and review of radiographic studies, pulse oximetry and re-evaluation of patient's condition.  ____________________________________________   INITIAL IMPRESSION / ASSESSMENT AND PLAN / ED COURSE  61 y.o. male with a history of CHF, COPD on 4 L nasal cannula, smoking, diabetes, CAD status post MI, hypertension who presents for evaluation of shortness of breath and fever. Patient meets sepsis criteria with a fever of 100.6 and tachycardic with heart rate of 124. He has mildly increased work of breathing but is satting well on 4 L nasal cannula with decreased air movement, no crackles or wheezing. We'll give DuoNeb and Solu-Medrol for COPD exacerbation. We'll give cefepime and azithromycin for pneumonia. We'll check for flu, chest x-ray, BNP, will give IV fluids with caution as patient has CHF  Clinical Course as of Sep 05 1528  Sun Sep 05, 2016  1529 Chest x-ray with no evidence of pneumonia however when patient was here the beginning of the month he also had a normal chest x-ray and was diagnosed with pneumonia with a CT scan of his chest. He is febrile, tachycardic, and coughing. His  white count is elevated at 13.1. Patient was started on cefepime and azithromycin. He'll be admitted to the hospitalist service.  [CV]    Clinical Course User Index [CV] Nita Sickle, MD    Pertinent labs & imaging results that were available during my care of the patient were reviewed by me and considered in my medical decision making (see chart for details).    ____________________________________________   FINAL CLINICAL IMPRESSION(S) /  ED DIAGNOSES  Final diagnoses:  COPD exacerbation (HCC)  Sepsis, due to unspecified organism [facility])      NEW MEDICATIONS STARTED DURING THIS VISIT:  New Prescriptions   No medications on file     Note:  This document was prepared using Dragon voice recognition software and may include unintentional dictation errors.    Nita Sickle, MD 09/05/16 1531

## 2016-09-06 LAB — CBC
HCT: 34.7 % — ABNORMAL LOW (ref 40.0–52.0)
Hemoglobin: 11.9 g/dL — ABNORMAL LOW (ref 13.0–18.0)
MCH: 28.8 pg (ref 26.0–34.0)
MCHC: 34.4 g/dL (ref 32.0–36.0)
MCV: 83.7 fL (ref 80.0–100.0)
Platelets: 301 10*3/uL (ref 150–440)
RBC: 4.14 MIL/uL — ABNORMAL LOW (ref 4.40–5.90)
RDW: 15.4 % — AB (ref 11.5–14.5)
WBC: 10.3 10*3/uL (ref 3.8–10.6)

## 2016-09-06 LAB — COMPREHENSIVE METABOLIC PANEL
ALT: 13 U/L — ABNORMAL LOW (ref 17–63)
ANION GAP: 7 (ref 5–15)
AST: 22 U/L (ref 15–41)
Albumin: 3.1 g/dL — ABNORMAL LOW (ref 3.5–5.0)
Alkaline Phosphatase: 80 U/L (ref 38–126)
BILIRUBIN TOTAL: 0.5 mg/dL (ref 0.3–1.2)
BUN: 10 mg/dL (ref 6–20)
CO2: 25 mmol/L (ref 22–32)
Calcium: 8.1 mg/dL — ABNORMAL LOW (ref 8.9–10.3)
Chloride: 106 mmol/L (ref 101–111)
Creatinine, Ser: 0.65 mg/dL (ref 0.61–1.24)
GFR calc Af Amer: >60 mL/min (ref >60)
Glucose, Bld: 179 mg/dL — ABNORMAL HIGH (ref 65–99)
POTASSIUM: 3.4 mmol/L — AB (ref 3.5–5.1)
Sodium: 138 mmol/L (ref 135–145)
TOTAL PROTEIN: 6 g/dL — AB (ref 6.5–8.1)

## 2016-09-06 LAB — URINE CULTURE: Culture: NO GROWTH

## 2016-09-06 LAB — TROPONIN I
Troponin I: <0.03 ng/mL (ref <0.03)
Troponin I: <0.03 ng/mL (ref <0.03)

## 2016-09-06 LAB — GLUCOSE, CAPILLARY
GLUCOSE-CAPILLARY: 205 mg/dL — AB (ref 65–99)
GLUCOSE-CAPILLARY: 162 mg/dL — AB (ref 65–99)
GLUCOSE-CAPILLARY: 136 mg/dL — AB (ref 65–99)
Glucose-Capillary: 168 mg/dL — ABNORMAL HIGH (ref 65–99)

## 2016-09-06 MED ORDER — DOXYCYCLINE HYCLATE 100 MG PO TABS
100.0000 mg | ORAL_TABLET | Freq: Two times a day (BID) | ORAL | Status: DC
Start: 2016-09-06 — End: 2016-09-07
  Administered 2016-09-06 – 2016-09-07 (×3): 100 mg via ORAL
  Filled 2016-09-06 (×4): qty 1

## 2016-09-06 MED ORDER — POTASSIUM CHLORIDE CRYS ER 20 MEQ PO TBCR
40.0000 meq | EXTENDED_RELEASE_TABLET | Freq: Once | ORAL | Status: AC
  Administered 2016-09-06: 40 meq via ORAL
  Filled 2016-09-06: qty 2

## 2016-09-06 MED ORDER — METHYLPREDNISOLONE SODIUM SUCC 40 MG IJ SOLR
40.0000 mg | Freq: Two times a day (BID) | INTRAMUSCULAR | Status: DC
  Administered 2016-09-06 – 2016-09-07 (×2): 40 mg via INTRAVENOUS
  Filled 2016-09-06 (×2): qty 1

## 2016-09-06 MED ORDER — DEXTROSE 5 % IV SOLN
2.0000 g | Freq: Three times a day (TID) | INTRAVENOUS | Status: DC
  Filled 2016-09-06: qty 2

## 2016-09-06 MED ORDER — INSULIN REGULAR HUMAN 100 UNIT/ML IJ SOLN: 3.0000 [IU] | Freq: Three times a day (TID) | INTRAMUSCULAR | Status: DC

## 2016-09-06 MED ORDER — INSULIN ASPART 100 UNIT/ML ~~LOC~~ SOLN
3.0000 [IU] | Freq: Three times a day (TID) | SUBCUTANEOUS | Status: DC
  Administered 2016-09-06 – 2016-09-07 (×3): 3 [IU] via SUBCUTANEOUS
  Filled 2016-09-06 (×3): qty 3

## 2016-09-06 NOTE — Progress Notes (Addendum)
Rn notified that patients lactic acid is 3.8. MD Hugelmeyer notified, Verbal order given to recheck lactic acid level. Order placed. Will continue to monitor.   Update 2200: Critical lactic acid of 2.6. MD Hugelemeyer paged. Awaiting page back.   Update 0000: Hosptialist paged numerous times throughout the night. No paged returned.   Update 0310: MD Pyreddy made aware of lactic acid. Per MD we will monitor patient since lactic is trending down. Will continue to monitor.

## 2016-09-06 NOTE — Progress Notes (Signed)
[employer] -  at [facility]   PATIENT NAME: Kristopher Powell    MR#:  [id number]  DATE OF BIRTH:  1956-05-30  SUBJECTIVE:   Patient here with SOB and feels better this am  REVIEW OF SYSTEMS:    Review of Systems  Constitutional: Negative.  Negative for chills, fever and malaise/fatigue.  HENT: Negative.  Negative for ear discharge, ear pain, hearing loss, nosebleeds and sore throat.   Eyes: Negative.  Negative for blurred vision and pain.  Respiratory: Positive for shortness of breath (better). Negative for cough, hemoptysis and wheezing.   Cardiovascular: Negative.  Negative for chest pain, palpitations and leg swelling.  Gastrointestinal: Negative.  Negative for abdominal pain, blood in stool, diarrhea, nausea and vomiting.  Genitourinary: Negative.  Negative for dysuria.  Musculoskeletal: Negative.  Negative for back pain.  Skin: Negative.   Neurological: Negative for dizziness, tremors, speech change, focal weakness, seizures and headaches.  Endo/Heme/Allergies: Negative.  Does not bruise/bleed easily.  Psychiatric/Behavioral: Negative.  Negative for depression, hallucinations and suicidal ideas.    Tolerating Diet: yes      DRUG ALLERGIES:  No Known Allergies  VITALS:  Blood pressure (!) 102/53, pulse 87, temperature 98.3 F (36.8 C), temperature source Oral, resp. rate 18, height 6' (1.829 m), weight 91.5 kg (201 lb 12.8 oz), SpO2 96 %.  PHYSICAL EXAMINATION:   Physical Exam    LABORATORY PANEL:   CBC  Recent Labs Lab 09/06/16 0518  WBC 10.3  HGB 11.9*  HCT 34.7*  PLT 301   ------------------------------------------------------------------------------------------------------------------  Chemistries   Recent Labs Lab 09/06/16 0518  NA 138  K 3.4*  CL 106  CO2 25  GLUCOSE 179*  BUN 10  CREATININE 0.65  CALCIUM 8.1*  AST 22  ALT 13*  ALKPHOS 80  BILITOT 0.5    ------------------------------------------------------------------------------------------------------------------  Cardiac Enzymes  Recent Labs Lab 09/05/16 1809 09/05/16 2352 09/06/16 0518  TROPONINI <0.03 <0.03 <0.03   ------------------------------------------------------------------------------------------------------------------  RADIOLOGY:  Dg Chest 2 View  Result Date: 09/05/2016 CLINICAL DATA:  Shortness of breath EXAM: CHEST  2 VIEW COMPARISON:  CT chest 08/17/2016 FINDINGS: The lungs are hyperinflated likely secondary to COPD. There is no focal parenchymal opacity. There is no pleural effusion or pneumothorax. The heart and mediastinal contours are unremarkable. The osseous structures are unremarkable. IMPRESSION: No active cardiopulmonary disease. Electronically Signed   By: Elige Ko   On: 09/05/2016 14:47     ASSESSMENT AND PLAN:   61 y.o. male has a past medical history significant for HTN, DM, COPD, and CAD now with acute worsening of SOB and cough.    1.  Acute on chronic respiratory failure with hypoxia: Patient weaned to baseline 4 liters of Oxygen   2.acute COPD exacerbation with improved symptoms Wean IV steroids Continue INHALERS and NEBS Stop cefepime and add doxy for URI/bronchitis   3.Essential HTN: Continue Metoprolol and norvasc   4. Diabetes: Dm consult Continue SSI  ADa diet  5. Hypokalemia: replete  CSW/CM  for dispo Management plans discussed with the patient and he is in agreement.  CODE STATUS: full  TOTAL TIME TAKING CARE OF THIS PATIENT: 30 minutes.     POSSIBLE D/C tomorrow, DEPENDING ON CLINICAL CONDITION.   Eydan Chianese M.D on 09/06/2016 at 11:50 AM  Between 7am to 6pm - Pager - [phone] After 6pm go to [email or web address] - Social research officer, government  [facility]  Office  [phone]  CC: Primary care physician; Candie Chroman, DO  Note:  This dictation was prepared with Dragon dictation along  with smaller phrase technology. Any transcriptional errors that result from this process are unintentional.

## 2016-09-06 NOTE — Progress Notes (Signed)
Patient having loose stools, has had 4 over the last two hours. MD Hugelmeyer made aware. Order given for C. Diff testing. Sample sent to lab, awaiting results. Enteric precautions initiated.

## 2016-09-06 NOTE — Evaluation (Signed)
Physical Therapy Evaluation Patient Details Name: Kristopher Powell MRN: [id number] DOB: 04-11-1956 Today's Date: 09/06/2016   History of Present Illness  Pt admitted for acute on chronic respiratory failure with hypoxia. Pt also COPD GOLD  member. Pt with history of CHF, CAD, HTN, DM, peripheral neuropathy, and anxiety. Pt with recent admission earlier this month for similar symptoms. Pt complains of SOB symptoms upon admission.   Clinical Impression  Pt is a pleasant 61 year old male who was admitted for acute on chronic respiratory failure and hypoxia. Pt performs bed mobility independently and transfers/ambulation with cga and RW. All mobility performed on 4L of O2 with O2 sats WNL. Pt demonstrates deficits with strength/mobility/endurance. Pt reports he feels slightly weaker than compared to normal. Would benefit from skilled PT to address above deficits and promote optimal return to PLOF. Recommend transition to HHPT upon discharge from acute hospitalization.       Follow Up Recommendations Home health PT    Equipment Recommendations  Rolling walker with 5" wheels    Recommendations for Other Services       Precautions / Restrictions Precautions Precautions: Fall Restrictions Weight Bearing Restrictions: No      Mobility  Bed Mobility Overal bed mobility: Independent             General bed mobility comments: safe technique with supine->EOB. No assist required  Transfers Overall transfer level: Needs assistance Equipment used: Rolling walker (2 wheeled) Transfers: Sit to/from Stand Sit to Stand: Min guard         General transfer comment: transfers performed with cga and upright posture. Heavy use of B UE on RW  Ambulation/Gait Ambulation/Gait assistance: Min guard Ambulation Distance (Feet): 40 Feet Assistive device: Rolling walker (2 wheeled) Gait Pattern/deviations: Step-through pattern;Wide base of support;Trunk flexed     General Gait Details:  ambulated using reciprocal gait pattern, however quickly fatigues and requests to sit down. O2 sats WNL, performed on 4L of O2. Further ambulation deferred secondary to fatigue  Stairs            Wheelchair Mobility    Modified Rankin (Stroke Patients Only)       Balance Overall balance assessment: Modified Independent                                           Pertinent Vitals/Pain Pain Assessment: No/denies pain    Home Living Family/patient expects to be discharged to:: Private residence Living Arrangements: Alone Available Help at Discharge: Family (son) Type of Home: House Home Access: Level entry     Home Layout: One level Home Equipment: Wheelchair - Leisure centre manager - standard      Prior Function Level of Independence: Independent with assistive device(s)         Comments: reports little walking and primarily uses motorized WC for mobility     Hand Dominance        Extremity/Trunk Assessment   Upper Extremity Assessment Upper Extremity Assessment: Generalized weakness (B UE grossly 4/5)    Lower Extremity Assessment Lower Extremity Assessment: Generalized weakness (B LE grossly 3+/5)       Communication   Communication: No difficulties  Cognition Arousal/Alertness: Awake/alert Behavior During Therapy: WFL for tasks assessed/performed Overall Cognitive Status: Within Functional Limits for tasks assessed  General Comments      Exercises Other Exercises Other Exercises: supine ther-ex performed on B LE including hip add squeezes, SLRs, and SAQ. All ther-ex performed x 10 on B LE with cga and cues for correct technique   Assessment/Plan    PT Assessment Patient needs continued PT services  PT Problem List Decreased activity tolerance;Decreased balance;Decreased mobility;Decreased safety awareness;Cardiopulmonary status limiting activity          PT Treatment Interventions DME  instruction;Gait training;Functional mobility training;Therapeutic activities;Therapeutic exercise;Balance training;Neuromuscular re-education;Patient/family education    PT Goals (Current goals can be found in the Care Plan section)  Acute Rehab PT Goals Patient Stated Goal: go home PT Goal Formulation: With patient Time For Goal Achievement: 09/20/16 Potential to Achieve Goals: Good    Frequency Min 2X/week   Barriers to discharge Decreased caregiver support home alone    Co-evaluation               End of Session Equipment Utilized During Treatment: Gait belt;Oxygen Activity Tolerance: Patient limited by fatigue Patient left: in bed;with call bell/phone within reach (son sleeping in chair; unable to transfer) Nurse Communication: Mobility status         Time: [id number] PT Time Calculation (min) (ACUTE ONLY): 18 min   Charges:   PT Evaluation $PT Eval Moderate Complexity: 1 Procedure PT Treatments $Therapeutic Exercise: 23-37 mins   PT G Codes:        Elfa Wooton 09/06/2016, 10:59 AM Elizabeth PalauStephanie Kimmie Berggren, PT, DPT [phone]

## 2016-09-06 NOTE — Progress Notes (Signed)
Inpatient Diabetes Program Recommendations  AACE/ADA: New Consensus Statement on Inpatient Glycemic Control (2015)  Target Ranges:  Prepandial:   less than 140 mg/dL      Peak postprandial:   less than 180 mg/dL (1-2 hours)      Critically ill patients:  140 - 180 mg/dL   Results for Kristopher Powell, Kristopher Powell (MRN [id number]) as of 09/06/2016 12:09  Ref. Range 09/06/2016 08:16 09/06/2016 11:58  Glucose-Capillary Latest Ref Range: 65 - 99 mg/dL 409168 (H) 811205 (H)    Admit with: SOB  History: DM, COPD  Home DM Meds: Metformin 500 mg BID  Current Insulin Orders: Novolog Sensitive Correction Scale/ SSI (0-9 units) TID AC      MD- Note patient currently getting Solumedrol 40 mg BID.  Elevated postprandial glucose levels today.  Please consider starting low dose Novolog Meal Coverage while patient getting steroids:  Novolog 3 units TID with meals (hold if pt eats <50% of meal)     --Will follow patient during hospitalization--  Ambrose FinlandJeannine Johnston Deyon Chizek RN, MSN, CDE Diabetes Coordinator Inpatient Glycemic Control Team Team Pager: [phone] (8a-5p)

## 2016-09-06 NOTE — Plan of Care (Signed)
Problem: Bowel/Gastric: Goal: Will not experience complications related to bowel motility Outcome: Not Progressing Patient having frequent loose stools.

## 2016-09-07 LAB — BASIC METABOLIC PANEL
ANION GAP: 4 — AB (ref 5–15)
BUN: 11 mg/dL (ref 6–20)
CO2: 26 mmol/L (ref 22–32)
Calcium: 8.3 mg/dL — ABNORMAL LOW (ref 8.9–10.3)
Chloride: 110 mmol/L (ref 101–111)
Creatinine, Ser: 0.65 mg/dL (ref 0.61–1.24)
GFR calc Af Amer: >60 mL/min (ref >60)
GFR calc non Af Amer: >60 mL/min (ref >60)
GLUCOSE: 115 mg/dL — AB (ref 65–99)
POTASSIUM: 3.8 mmol/L (ref 3.5–5.1)
Sodium: 140 mmol/L (ref 135–145)

## 2016-09-07 LAB — GLUCOSE, CAPILLARY
GLUCOSE-CAPILLARY: 119 mg/dL — AB (ref 65–99)
Glucose-Capillary: 125 mg/dL — ABNORMAL HIGH (ref 65–99)

## 2016-09-07 LAB — C DIFFICILE QUICK SCREEN W PCR REFLEX
C DIFFICILE (CDIFF) INTERP: NOT DETECTED
C Diff antigen: NEGATIVE
C Diff toxin: NEGATIVE

## 2016-09-07 MED ORDER — PREDNISONE 10 MG PO TABS: 10.0000 mg | ORAL_TABLET | Freq: Every day | ORAL | 0 refills | Status: DC

## 2016-09-07 MED ORDER — CALCIUM CARBONATE ANTACID 500 MG PO CHEW
1.0000 | CHEWABLE_TABLET | Freq: Once | ORAL | Status: AC
  Administered 2016-09-07: 200 mg via ORAL
  Filled 2016-09-07: qty 1

## 2016-09-07 MED ORDER — DIAZEPAM 10 MG PO TABS: 10.0000 mg | ORAL_TABLET | Freq: Three times a day (TID) | ORAL | 0 refills | Status: DC

## 2016-09-07 MED ORDER — DOXYCYCLINE HYCLATE 100 MG PO TABS: 100.0000 mg | ORAL_TABLET | Freq: Two times a day (BID) | ORAL | 0 refills | Status: DC

## 2016-09-07 NOTE — Progress Notes (Signed)
Pt c/o hyperacidity and requested if he could have TUMS as he was taking at home. Dr Dahlia BailiffHugelmyer paged and ordered to give one time dose of TUMS 1 tablet. Writer informed above MD as well that pt came back negative for C.diff. and ordered discontinuation of current enteric precautions. Will carry out above orders.

## 2016-09-07 NOTE — Discharge Summary (Addendum)
[facility] [place] [facility]   PATIENT NAME: Kristopher Powell    MR#:  [id number]  DATE OF BIRTH:  1956/07/07  DATE OF ADMISSION:  09/05/2016 ADMITTING PHYSICIAN: Marguarite Arbour, MD  DATE OF DISCHARGE: 09/07/2016  PRIMARY CARE PHYSICIAN: Candie Chroman, DO    ADMISSION DIAGNOSIS:  COPD exacerbation (HCC) [J44.1] Sepsis, due to unspecified organism (HCC) [A41.9]  DISCHARGE DIAGNOSIS:  Principal Problem:   Acute on chronic respiratory failure with hypoxia and hypercapnia (HCC) Active Problems:   COPD (chronic obstructive pulmonary disease) (HCC)   SIRS (systemic inflammatory response syndrome) (HCC)   Acute URI   Acute respiratory failure (HCC)   SECONDARY DIAGNOSIS:   Past Medical History:  Diagnosis Date  . CHF (congestive heart failure) (HCC)   . COPD (chronic obstructive pulmonary disease) (HCC)   . Diabetes mellitus without complication (HCC)   . Hypertension   . MI (myocardial infarction) 2006    HOSPITAL COURSE:   61 y.o.malehas a past medical history significant for HTN, DM, COPD, and CAD now with acute worsening of SOB and cough.    1. Acute on chronic respiratory failure with hypoxia: Patient Has been weaned to his baseline of 4 liters of Oxygen.   2.acute COPD exacerbation with acute bronchitis: Patient symptoms have improved. Patient will be discharged with prednisone taper and doxycycline. He will continue inhalers.   3.Essential HTN: Continue Metoprolol and norvasc   4. Diabetes: He will continuehome medications 5. Hypokalemia repleted  6. Tobacco dependence: Patient is encouraged to quit smoking. Counseling was provided for 4 minutes.   DISCHARGE CONDITIONS AND DIET:   Stable for discharge on diabetic diet  CONSULTS OBTAINED:    DRUG ALLERGIES:  No Known Allergies  DISCHARGE MEDICATIONS:   Current Discharge Medication List    START taking these medications   Details  doxycycline  (VIBRA-TABS) 100 MG tablet Take 1 tablet (100 mg total) by mouth every 12 (twelve) hours. Qty: 10 tablet, Refills: 0    predniSONE (DELTASONE) 10 MG tablet Take 1 tablet (10 mg total) by mouth daily with breakfast. 60 mg PO (ORAL) x 2 days 50 mg PO (ORAL)  x 2 days 40 mg PO (ORAL)  x 2 days 30 mg PO  (ORAL)  x 2 days 20 mg PO  (ORAL) x 2 days 10 mg PO  (ORAL) x 2 days then stop Qty: 42 tablet, Refills: 0      CONTINUE these medications which have CHANGED   Details  diazepam (VALIUM) 10 MG tablet Take 1 tablet (10 mg total) by mouth 3 (three) times daily. Qty: 90 tablet, Refills: 0      CONTINUE these medications which have NOT CHANGED   Details  albuterol (PROVENTIL HFA;VENTOLIN HFA) 108 (90 Base) MCG/ACT inhaler Inhale 2 puffs into the lungs every 6 (six) hours as needed for wheezing or shortness of breath.    amLODipine (NORVASC) 5 MG tablet Take 5 mg by mouth daily.    aspirin EC 325 MG tablet Take 325 mg by mouth daily.    esomeprazole (NEXIUM) 40 MG capsule Take 40 mg by mouth daily.    Fluticasone-Salmeterol (ADVAIR) 100-50 MCG/DOSE AEPB Inhale 1 puff into the lungs 2 (two) times daily.    furosemide (LASIX) 20 MG tablet Take 20 mg by mouth daily.    gabapentin (NEURONTIN) 300 MG capsule Take 1,200 mg by mouth 3 (three) times daily.     guaiFENesin (MUCINEX) 600 MG 12 hr tablet Take 1 tablet (  600 mg total) by mouth 2 (two) times daily. Qty: 20 tablet, Refills: 0    imipramine (TOFRANIL) 50 MG tablet Take 200 mg by mouth at bedtime.    ipratropium-albuterol (DUONEB) 0.5-2.5 (3) MG/3ML SOLN Take 3 mLs by nebulization every 6 (six) hours. Qty: 360 mL, Refills: 0    metFORMIN (GLUCOPHAGE-XR) 500 MG 24 hr tablet Take 500 mg by mouth 2 (two) times daily.    metoprolol succinate (TOPROL XL) 25 MG 24 hr tablet Take 25 mg by mouth daily.    mometasone-formoterol (DULERA) 200-5 MCG/ACT AERO Inhale 2 puffs into the lungs 2 (two) times daily. Qty: 1 Inhaler, Refills: 1     risperiDONE (RISPERDAL) 2 MG tablet Take 4 mg by mouth at bedtime.      STOP taking these medications     levofloxacin (LEVAQUIN) 500 MG tablet      predniSONE (STERAPRED UNI-PAK 21 TAB) 10 MG (21) TBPK tablet               Today   CHIEF COMPLAINT:   No acute events overnight. Patient is doing well. Patient would like to shower before he leaves the hospital.   VITAL SIGNS:  Blood pressure 127/71, pulse 95, temperature 97.7 F (36.5 C), temperature source Oral, resp. rate 20, height 6' (1.829 m), weight 91.2 kg (201 lb 1 oz), SpO2 94 %.   REVIEW OF SYSTEMS:  Review of Systems  Constitutional: Negative.  Negative for chills, fever and malaise/fatigue.  HENT: Negative.  Negative for ear discharge, ear pain, hearing loss, nosebleeds and sore throat.   Eyes: Negative.  Negative for blurred vision and pain.  Respiratory: Negative.  Negative for cough, hemoptysis, shortness of breath and wheezing.   Cardiovascular: Negative.  Negative for chest pain, palpitations and leg swelling.  Gastrointestinal: Negative.  Negative for abdominal pain, blood in stool, diarrhea, nausea and vomiting.  Genitourinary: Negative.  Negative for dysuria.  Musculoskeletal: Negative.  Negative for back pain.  Skin: Negative.   Neurological: Negative for dizziness, tremors, speech change, focal weakness, seizures and headaches.  Endo/Heme/Allergies: Negative.  Does not bruise/bleed easily.  Psychiatric/Behavioral: Negative.  Negative for depression, hallucinations and suicidal ideas.     PHYSICAL EXAMINATION:  GENERAL:  61 y.o.-year-old patient lying in the bed with no acute distress.  NECK:  Supple, no jugular venous distention. No thyroid enlargement, no tenderness.  LUNGS: Normal breath sounds bilaterally, no wheezing, rales,rhonchi  No use of accessory muscles of respiration.  CARDIOVASCULAR: S1, S2 normal. No murmurs, rubs, or gallops.  ABDOMEN: Soft, non-tender, non-distended. Bowel  sounds present. No organomegaly or mass.  EXTREMITIES: No pedal edema, cyanosis, or clubbing.  PSYCHIATRIC: The patient is alert and oriented x 3.  SKIN: No obvious rash, lesion, or ulcer.   DATA REVIEW:   CBC  Recent Labs Lab 09/06/16 0518  WBC 10.3  HGB 11.9*  HCT 34.7*  PLT 301    Chemistries   Recent Labs Lab 09/06/16 0518 09/07/16 0449  NA 138 140  K 3.4* 3.8  CL 106 110  CO2 25 26  GLUCOSE 179* 115*  BUN 10 11  CREATININE 0.65 0.65  CALCIUM 8.1* 8.3*  AST 22  --   ALT 13*  --   ALKPHOS 80  --   BILITOT 0.5  --     Cardiac Enzymes  Recent Labs Lab 09/05/16 1809 09/05/16 2352 09/06/16 0518  TROPONINI <0.03 <0.03 <0.03    Microbiology Results  @MICRORSLT48 @  RADIOLOGY:  Dg Chest 2 View  Result Date: 09/05/2016 CLINICAL DATA:  Shortness of breath EXAM: CHEST  2 VIEW COMPARISON:  CT chest 08/17/2016 FINDINGS: The lungs are hyperinflated likely secondary to COPD. There is no focal parenchymal opacity. There is no pleural effusion or pneumothorax. The heart and mediastinal contours are unremarkable. The osseous structures are unremarkable. IMPRESSION: No active cardiopulmonary disease. Electronically Signed   By: Elige Ko   On: 09/05/2016 14:47      Management plans discussed with the patient and he is in agreement. Stable for discharge home with [facility]  Patient should follow up with pcp  CODE STATUS:     Code Status Orders        Start     Ordered   09/05/16 1809  Full code  Continuous     09/05/16 1808    Code Status History    Date Active Date Inactive Code Status Order ID Comments User Context   08/14/2016 10:48 AM 08/19/2016  7:57 PM Full Code [id number]  Arnaldo Natal, MD Inpatient   08/06/2016 11:40 PM 08/07/2016  4:34 PM Full Code [id number]  Tonye Royalty, DO Inpatient   05/23/2015 10:14 PM 05/25/2015  2:59 PM Full Code [id number]  Altamese Dilling, MD Inpatient      TOTAL TIME TAKING CARE OF THIS PATIENT: 37 minutes.     Note: This dictation was prepared with Dragon dictation along with smaller phrase technology. Any transcriptional errors that result from this process are unintentional.  Jalyne Brodzinski M.D on 09/07/2016 at 12:25 PM  Between 7am to 6pm - Pager - [phone] After 6pm go to [email or web address] - Social research officer, government  [facility]  Office  [phone]  CC: Primary care physician; Candie Chroman, DO

## 2016-09-07 NOTE — Care Management (Signed)
Team: patient has no PT coverage under Medicaid and current diagnosis.  It will cover short term nursing, social work, and/or nurse aide if medically necessary.

## 2016-09-07 NOTE — Care Management Note (Signed)
Case Management Note  Patient Details  Name: Kristopher Powell MRN: [id number] Date of Birth: Oct 29, 1955  Subjective/Objective:                    Met with patient prior to discharge to home today.He will need EMS to home today-He does not have a portable O2 tank and taxi will not take patient home with portable tank for liability concerns. I confirmed that patient does have home O2 through [employer]. PT is recommending walker which has been requested from [facility].  Per CSW he has a physical address is stated as [place]. Patient refused home health services. Action/Plan:  EMS packet completed. Patient may discharge when walker is delivered and nurse arranges EMS.    Expected Discharge Date:  09/07/16               Expected Discharge Plan:     In-House Referral:  Clinical Social Work  Discharge planning Services  CM Consult  Post Acute Care Choice:  Durable Medical Equipment, Home Health Choice offered to:  Patient  DME Arranged:  Oxygen DME Agency:  [employer]  HH Arranged:  RN, Nurse's Aide, Social Work [employer] Agency:  [facility]  Status of Service:  In process, will continue to follow  If discussed at Long Length of Stay Meetings, dates discussed:    Additional Comments:  Marshell Garfinkel, RN 09/07/2016, 1:53 PM

## 2016-09-07 NOTE — Clinical Social Work Note (Addendum)
Clinical Social Work Assessment  Patient Details  Name: Kristopher Powell MRN: [id number] Date of Birth: 10-May-1956  Date of referral:  09/07/16               Reason for consult:  Discharge Planning                Permission sought to share information with:  Case Manager Permission granted to share information::  Yes, Verbal Permission Granted  Name::        Agency::   [facility]   Relationship::     Contact Information:     Housing/Transportation Living arrangements for the past 2 months:  [place] of Information:  Patient Patient Interpreter Needed:  None Criminal Activity/Legal Involvement Pertinent to Current Situation/Hospitalization:  No - Comment as needed Significant Relationships:  Adult Children Lives with:  Adult Children Do you feel safe going back to the place where you live?  Yes Need for family participation in patient care:  No (Coment)  Care giving concerns:  Patient lives at home in [place] with his son, Quillian Quince.    Social Worker assessment / plan:  Social work Theatre manager received social work consult for homelessness. PT is recommending home health. Social work Theatre manager met with patient at bedside. Patient was sitting up in bed and was oriented X4. Social work Theatre manager introduced herself and and explained role of social work department. Social work Theatre manager asked patient about his living conditions as there was a concern. Per patient, patient lives with his son Quillian Quince at his home in [place]. Patient confirmed the address [place] [address] [place] [address]. Patient explained that he drives and still provides for himself with the help of his son. Patient explained that the transmission on his truck went out but he can use his son's car if he needs to get places. Social work Theatre manager asked if patient as a preference of a Brewing technologist that he would like to come out. Patient responded by saying "I do not need an agency coming out to help take care  of me". Social work Theatre manager reconfirmed he did not want a home health agency to come out. Patient again said "no". Patient reported no needs or concerns at this time.    Plans are for patient to discharge back to his house.   Employment status:  Unemployed Forensic scientist:  Medicaid [place] PT Recommendations:  Home with [place] / Referral to community resources:     Patient/Family's Response to care:  Patient refused for a home health agency to come in for assistance.   Patient/Family's Understanding of and Emotional Response to Diagnosis, Current Treatment, and Prognosis:  Patient was pleasant and thanked social work Theatre manager for coming by.   Emotional Assessment Appearance:  Appears stated age Attitude/Demeanor/Rapport:    Affect (typically observed):  Accepting, Adaptable, Appropriate Orientation:  Oriented to Self, Oriented to Place, Oriented to  Time, Oriented to Situation Alcohol / Substance use:  Not Applicable Psych involvement (Current and /or in the community):  No (Comment)  Discharge Needs  Concerns to be addressed:  Basic Needs, Denies Needs/Concerns at this time Readmission within the last 30 days:  No Current discharge risk:  Chronically ill Barriers to Discharge:  Continued Medical Work up   [employer], [place] Work 09/07/2016, 11:39 AM

## 2016-09-07 NOTE — Progress Notes (Signed)
Patient transferred to room 148. Report given to [name]. Belongings packed at bedside with patient.Patient stable at time of transfer. VSS.  Attempted to call son, Kristopher Powell, but unable to reach him, and voicemail box is full. Will attempt again at a later time.   Mayra NeerNesbitt, Jakeya Gherardi M

## 2016-09-07 NOTE — Progress Notes (Signed)
Pt dc'd via ems at this time.

## 2016-09-07 NOTE — Progress Notes (Signed)
Per RN patient does not have a ride home today. Clinical Education officer, museum (CSW) met with patient to discuss transportation issue. Per patient his son will be home in 10-15 minutes and will be able to let him in the house. Patient reported that he has plenty of food in the home and access to his medications. Patient reported that he has running water, heating and air conditioning. Patient reported that his son does not have a car and has to secure rides from others. Patient will D/C home via EMS today. CSW explained to patient the insurance will not cover EMS and patient will have a bill. Patient verbalized his understanding. RN case manager completed EMS form. RN will call EMS. Please reconsult if future social work needs arise. CSW signing off.   [employer], LCSW [phone]

## 2016-09-07 NOTE — Progress Notes (Signed)
New Transfer Note:   Arrival Method: per bed from 2A Mental Orientation: alert and oriented X4 Telemetry: none ordered Assessment: Completed Skin: warm, dry, with scattered bruises noted on both arms. Pain: denies having any pain at this time Tubes: O2 inhalation at 4Lpm Safety Measures: Safety Fall Prevention Plan has been given and discussed Admission: Completed at 2A 1A Orientation: Patient has been oriented to the room, unit and staff.  Family: no family member around at bedside  Orders have been reviewed and implemented. Will continue to monitor patient. Call light has been placed within reach and bed alarm has been activated.   Janice NorrieAnessa Oceania Noori BSN, RN [facility] 1A

## 2016-09-10 LAB — CULTURE, BLOOD (ROUTINE X 2)
Culture: NO GROWTH
Culture: NO GROWTH

## 2017-01-07 ENCOUNTER — Inpatient Hospital Stay
Admission: EM | Admit: 2017-01-07 | Discharge: 2017-01-09 | DRG: 641 | Disposition: A | Discharge status: Home / Self Care | Payer: Medicaid Other | Attending: Internal Medicine | Admitting: Internal Medicine

## 2017-01-07 ENCOUNTER — Emergency Department: Payer: Medicaid Other

## 2017-01-07 ENCOUNTER — Encounter: Payer: Self-pay | Admitting: Medical Oncology

## 2017-01-07 DIAGNOSIS — E119 Type 2 diabetes mellitus without complications: Secondary | ICD-10-CM | POA: Diagnosis present

## 2017-01-07 DIAGNOSIS — J441 Chronic obstructive pulmonary disease with (acute) exacerbation: Secondary | ICD-10-CM | POA: Diagnosis present

## 2017-01-07 DIAGNOSIS — Z79899 Other long term (current) drug therapy: Secondary | ICD-10-CM | POA: Diagnosis not present

## 2017-01-07 DIAGNOSIS — Z7982 Long term (current) use of aspirin: Secondary | ICD-10-CM | POA: Diagnosis not present

## 2017-01-07 DIAGNOSIS — E871 Hypo-osmolality and hyponatremia: Secondary | ICD-10-CM | POA: Diagnosis present

## 2017-01-07 DIAGNOSIS — I509 Heart failure, unspecified: Secondary | ICD-10-CM | POA: Diagnosis present

## 2017-01-07 DIAGNOSIS — F17210 Nicotine dependence, cigarettes, uncomplicated: Secondary | ICD-10-CM | POA: Diagnosis present

## 2017-01-07 DIAGNOSIS — I110 Hypertensive heart disease with heart failure: Secondary | ICD-10-CM | POA: Diagnosis present

## 2017-01-07 DIAGNOSIS — Z955 Presence of coronary angioplasty implant and graft: Secondary | ICD-10-CM

## 2017-01-07 DIAGNOSIS — I252 Old myocardial infarction: Secondary | ICD-10-CM

## 2017-01-07 DIAGNOSIS — E860 Dehydration: Secondary | ICD-10-CM | POA: Diagnosis not present

## 2017-01-07 DIAGNOSIS — Z7984 Long term (current) use of oral hypoglycemic drugs: Secondary | ICD-10-CM

## 2017-01-07 LAB — TROPONIN I: Troponin I: <0.03 ng/mL (ref <0.03)

## 2017-01-07 LAB — CK: Total CK: 40 U/L — ABNORMAL LOW (ref 49–397)

## 2017-01-07 LAB — BETA-HYDROXYBUTYRIC ACID: BETA-HYDROXYBUTYRIC ACID: 0.61 mmol/L — AB (ref 0.05–0.27)

## 2017-01-07 LAB — BASIC METABOLIC PANEL
Anion gap: 11 (ref 5–15)
BUN: 5 mg/dL — ABNORMAL LOW (ref 6–20)
CHLORIDE: 85 mmol/L — AB (ref 101–111)
CO2: 21 mmol/L — ABNORMAL LOW (ref 22–32)
CREATININE: 0.62 mg/dL (ref 0.61–1.24)
Calcium: 9 mg/dL (ref 8.9–10.3)
Glucose, Bld: 117 mg/dL — ABNORMAL HIGH (ref 65–99)
POTASSIUM: 3.2 mmol/L — AB (ref 3.5–5.1)
SODIUM: 117 mmol/L — AB (ref 135–145)

## 2017-01-07 LAB — CBC WITH DIFFERENTIAL/PLATELET
BASOS ABS: 0.1 10*3/uL (ref 0–0.1)
BASOS PCT: 1 %
EOS ABS: 0.2 10*3/uL (ref 0–0.7)
Eosinophils Relative: 1 %
HEMATOCRIT: 40.2 % (ref 40.0–52.0)
HEMOGLOBIN: 13.9 g/dL (ref 13.0–18.0)
Lymphocytes Relative: 30 %
Lymphs Abs: 3.8 10*3/uL — ABNORMAL HIGH (ref 1.0–3.6)
MCH: 27.3 pg (ref 26.0–34.0)
MCHC: 34.7 g/dL (ref 32.0–36.0)
MCV: 78.6 fL — ABNORMAL LOW (ref 80.0–100.0)
MONOS PCT: 9 %
Monocytes Absolute: 1.2 10*3/uL — ABNORMAL HIGH (ref 0.2–1.0)
NEUTROS ABS: 7.3 10*3/uL — AB (ref 1.4–6.5)
NEUTROS PCT: 59 %
Platelets: 312 10*3/uL (ref 150–440)
RBC: 5.11 MIL/uL (ref 4.40–5.90)
RDW: 15.1 % — ABNORMAL HIGH (ref 11.5–14.5)
WBC: 12.6 10*3/uL — AB (ref 3.8–10.6)

## 2017-01-07 LAB — URINALYSIS, COMPLETE (UACMP) WITH MICROSCOPIC
BACTERIA UA: NONE SEEN
Bilirubin Urine: NEGATIVE
GLUCOSE, UA: NEGATIVE mg/dL
Hgb urine dipstick: NEGATIVE
KETONES UR: 5 mg/dL — AB
Leukocytes, UA: NEGATIVE
Nitrite: NEGATIVE
PH: 7 (ref 5.0–8.0)
Protein, ur: NEGATIVE mg/dL
RBC / HPF: NONE SEEN RBC/hpf (ref 0–5)
SPECIFIC GRAVITY, URINE: 1.002 — AB (ref 1.005–1.030)
SQUAMOUS EPITHELIAL / LPF: NONE SEEN
WBC, UA: NONE SEEN WBC/hpf (ref 0–5)

## 2017-01-07 LAB — HEPATIC FUNCTION PANEL
ALK PHOS: 115 U/L (ref 38–126)
ALT: 16 U/L — ABNORMAL LOW (ref 17–63)
AST: 24 U/L (ref 15–41)
Albumin: 4.4 g/dL (ref 3.5–5.0)
Bilirubin, Direct: 0.2 mg/dL (ref 0.1–0.5)
Indirect Bilirubin: 0.8 mg/dL (ref 0.3–0.9)
TOTAL PROTEIN: 7.2 g/dL (ref 6.5–8.1)
Total Bilirubin: 1 mg/dL (ref 0.3–1.2)

## 2017-01-07 LAB — BLOOD GAS, VENOUS
PATIENT TEMPERATURE: 37
PCO2 VEN: 28 mmHg — AB (ref 44.0–60.0)
PH VEN: 7.48 — AB (ref 7.250–7.430)
PO2 VEN: 47 mmHg — AB (ref 32.0–45.0)

## 2017-01-07 LAB — BRAIN NATRIURETIC PEPTIDE: B NATRIURETIC PEPTIDE 5: 15 pg/mL (ref 0.0–100.0)

## 2017-01-07 MED ORDER — IMIPRAMINE HCL 50 MG PO TABS
200.0000 mg | ORAL_TABLET | Freq: Every day | ORAL | Status: DC
  Administered 2017-01-07 – 2017-01-08 (×2): 200 mg via ORAL
  Filled 2017-01-07 (×3): qty 4

## 2017-01-07 MED ORDER — METFORMIN HCL ER 500 MG PO TB24
500.0000 mg | ORAL_TABLET | Freq: Two times a day (BID) | ORAL | Status: DC
  Administered 2017-01-08 – 2017-01-09 (×3): 500 mg via ORAL
  Filled 2017-01-07 (×4): qty 1

## 2017-01-07 MED ORDER — FUROSEMIDE 40 MG PO TABS: 20.0000 mg | ORAL_TABLET | Freq: Every day | ORAL | Status: DC

## 2017-01-07 MED ORDER — GUAIFENESIN ER 600 MG PO TB12
600.0000 mg | ORAL_TABLET | Freq: Two times a day (BID) | ORAL | Status: DC | PRN
  Administered 2017-01-07: 22:00:00 600 mg via ORAL
  Filled 2017-01-07: qty 1

## 2017-01-07 MED ORDER — GABAPENTIN 300 MG PO CAPS
1200.0000 mg | ORAL_CAPSULE | Freq: Three times a day (TID) | ORAL | Status: DC
  Administered 2017-01-07 – 2017-01-09 (×5): 1200 mg via ORAL
  Filled 2017-01-07 (×5): qty 4

## 2017-01-07 MED ORDER — DEXTROSE 5 % IV SOLN
500.0000 mg | Freq: Once | INTRAVENOUS | Status: AC
  Administered 2017-01-07: 500 mg via INTRAVENOUS
  Filled 2017-01-07: qty 500

## 2017-01-07 MED ORDER — METOPROLOL SUCCINATE ER 50 MG PO TB24
25.0000 mg | ORAL_TABLET | Freq: Every day | ORAL | Status: DC
  Administered 2017-01-08 – 2017-01-09 (×2): 25 mg via ORAL
  Filled 2017-01-07 (×3): qty 1

## 2017-01-07 MED ORDER — INSULIN ASPART 100 UNIT/ML ~~LOC~~ SOLN
0.0000 [IU] | Freq: Three times a day (TID) | SUBCUTANEOUS | Status: DC
Start: 2017-01-08 — End: 2017-01-09
  Administered 2017-01-08: 09:00:00 3 [IU] via SUBCUTANEOUS
  Filled 2017-01-07: qty 3

## 2017-01-07 MED ORDER — MOMETASONE FURO-FORMOTEROL FUM 100-5 MCG/ACT IN AERO
2.0000 | INHALATION_SPRAY | Freq: Two times a day (BID) | RESPIRATORY_TRACT | Status: DC
  Filled 2017-01-07: qty 8.8

## 2017-01-07 MED ORDER — IPRATROPIUM-ALBUTEROL 0.5-2.5 (3) MG/3ML IN SOLN: 3.0000 mL | RESPIRATORY_TRACT | Status: DC | PRN

## 2017-01-07 MED ORDER — PANTOPRAZOLE SODIUM 40 MG PO TBEC
40.0000 mg | DELAYED_RELEASE_TABLET | Freq: Every day | ORAL | Status: DC
  Administered 2017-01-07 – 2017-01-09 (×3): 40 mg via ORAL
  Filled 2017-01-07 (×3): qty 1

## 2017-01-07 MED ORDER — ASPIRIN EC 325 MG PO TBEC
325.0000 mg | DELAYED_RELEASE_TABLET | Freq: Every day | ORAL | Status: DC
  Administered 2017-01-08 – 2017-01-09 (×2): 325 mg via ORAL
  Filled 2017-01-07 (×2): qty 1

## 2017-01-07 MED ORDER — SODIUM CHLORIDE 0.9 % IV SOLN
INTRAVENOUS | Status: DC
  Administered 2017-01-07 – 2017-01-09 (×3): via INTRAVENOUS

## 2017-01-07 MED ORDER — DIAZEPAM 5 MG PO TABS
10.0000 mg | ORAL_TABLET | Freq: Three times a day (TID) | ORAL | Status: DC
  Administered 2017-01-07 – 2017-01-09 (×5): 10 mg via ORAL
  Filled 2017-01-07 (×5): qty 2

## 2017-01-07 MED ORDER — HEPARIN SODIUM (PORCINE) 5000 UNIT/ML IJ SOLN
5000.0000 [IU] | Freq: Three times a day (TID) | INTRAMUSCULAR | Status: DC
  Administered 2017-01-07 – 2017-01-09 (×4): 5000 [IU] via SUBCUTANEOUS
  Filled 2017-01-07 (×4): qty 1

## 2017-01-07 MED ORDER — MAGNESIUM SULFATE 2 GM/50ML IV SOLN
2.0000 g | Freq: Once | INTRAVENOUS | Status: AC
  Administered 2017-01-07: 2 g via INTRAVENOUS
  Filled 2017-01-07: qty 50

## 2017-01-07 MED ORDER — MOMETASONE FURO-FORMOTEROL FUM 200-5 MCG/ACT IN AERO
2.0000 | INHALATION_SPRAY | Freq: Two times a day (BID) | RESPIRATORY_TRACT | Status: DC
  Administered 2017-01-07 – 2017-01-09 (×4): 2 via RESPIRATORY_TRACT
  Filled 2017-01-07: qty 8.8

## 2017-01-07 MED ORDER — NICOTINE 21 MG/24HR TD PT24
21.0000 mg | MEDICATED_PATCH | Freq: Every day | TRANSDERMAL | Status: DC
  Administered 2017-01-07 – 2017-01-09 (×3): 21 mg via TRANSDERMAL
  Filled 2017-01-07 (×3): qty 1

## 2017-01-07 MED ORDER — SODIUM CHLORIDE 0.9 % IV BOLUS (SEPSIS)
1000.0000 mL | Freq: Once | INTRAVENOUS | Status: AC
  Administered 2017-01-07: 1000 mL via INTRAVENOUS

## 2017-01-07 MED ORDER — AMLODIPINE BESYLATE 5 MG PO TABS
5.0000 mg | ORAL_TABLET | Freq: Every day | ORAL | Status: DC
  Administered 2017-01-08 – 2017-01-09 (×2): 5 mg via ORAL
  Filled 2017-01-07 (×2): qty 1

## 2017-01-07 MED ORDER — RISPERIDONE 1 MG PO TABS
4.0000 mg | ORAL_TABLET | Freq: Every day | ORAL | Status: DC
  Administered 2017-01-07 – 2017-01-08 (×2): 4 mg via ORAL
  Filled 2017-01-07 (×2): qty 4

## 2017-01-07 MED ORDER — DOCUSATE SODIUM 100 MG PO CAPS: 100.0000 mg | ORAL_CAPSULE | Freq: Two times a day (BID) | ORAL | Status: DC | PRN

## 2017-01-07 NOTE — ED Notes (Signed)
Report from heather, rn.

## 2017-01-07 NOTE — ED Triage Notes (Signed)
Pt from home via ems with reports of worsening sob over past few days, hx of copd. Pt did albuterol treatment at home pta then ems administered 2 additional nebs and 125mg  solumedrol. Pt denies pain.

## 2017-01-07 NOTE — H&P (Signed)
[employer] [facility] - [place] at [facility]   PATIENT NAME: Kristopher Powell    MR#:  [id number]  DATE OF BIRTH:  08/18/1955  DATE OF ADMISSION:  01/07/2017  PRIMARY CARE PHYSICIAN: Candie Chroman, DO   REQUESTING/REFERRING PHYSICIAN: Rifenbark  CHIEF COMPLAINT:   Chief Complaint  Patient presents with  . Shortness of Breath    HISTORY OF PRESENT ILLNESS: Kristopher Powell  is a 61 y.o. male with a known history of COPD, CHF, diabetes, hypertension, coronary artery disease- for last 2-3 days feeling more weak and started draining shortness of breath today so decided to come to emergency room. He said he was not eating or drinking enough, but does not specify any clear reason for that. In ER he is noted to have hyponatremia and here visiting which responded to repeated nebulizer therapies. He also complained of generalized weakness and so ER physician suggested to admit to hospitalist service.  PAST MEDICAL HISTORY:   Past Medical History:  Diagnosis Date  . CHF (congestive heart failure) (HCC)   . COPD (chronic obstructive pulmonary disease) (HCC)   . Diabetes mellitus without complication (HCC)   . Hypertension   . MI (myocardial infarction) (HCC) 2006    PAST SURGICAL HISTORY: Past Surgical History:  Procedure Laterality Date  . APPENDECTOMY    . CHOLECYSTECTOMY    . CORONARY ANGIOPLASTY WITH STENT PLACEMENT      SOCIAL HISTORY:  Social History  Substance Use Topics  . Smoking status: Current Every Day Smoker    Packs/day: 1.00    Years: 40.00    Types: Cigarettes  . Smokeless tobacco: Never Used  . Alcohol use 7.2 - 14.4 oz/week    12 - 24 Cans of beer per week     Comment: occ    FAMILY HISTORY:  Family History  Problem Relation Age of Onset  . CAD Mother        deceased 33  . Stroke Father        deceased 56  . Heart attack Brother        deceased 86    DRUG ALLERGIES: No Known Allergies  REVIEW OF SYSTEMS:   CONSTITUTIONAL: No  fever,Positive for fatigue or weakness.  EYES: No blurred or double vision.  EARS, NOSE, AND THROAT: No tinnitus or ear pain.  RESPIRATORY: No cough, shortness of breath, some wheezing , no hemoptysis.  CARDIOVASCULAR: No chest pain, orthopnea, edema.  GASTROINTESTINAL: No nausea, vomiting, diarrhea or abdominal pain.  GENITOURINARY: No dysuria, hematuria.  ENDOCRINE: No polyuria, nocturia,  HEMATOLOGY: No anemia, easy bruising or bleeding SKIN: No rash or lesion. MUSCULOSKELETAL: No joint pain or arthritis.   NEUROLOGIC: No tingling, numbness, weakness.  PSYCHIATRY: No anxiety or depression.   MEDICATIONS AT HOME:  Prior to Admission medications   Medication Sig Start Date End Date Taking? Authorizing Provider  amLODipine (NORVASC) 5 MG tablet Take 5 mg by mouth daily.   Yes [provider]  aspirin EC 325 MG tablet Take 325 mg by mouth daily.   Yes [provider]  diazepam (VALIUM) 10 MG tablet Take 1 tablet (10 mg total) by mouth 3 (three) times daily. 09/07/16  Yes Mody, Patricia Pesa, MD  doxycycline (VIBRA-TABS) 100 MG tablet Take 1 tablet (100 mg total) by mouth every 12 (twelve) hours. 09/07/16  Yes Mody, Patricia Pesa, MD  esomeprazole (NEXIUM) 40 MG capsule Take 40 mg by mouth daily.   Yes [provider]  Fluticasone-Salmeterol (ADVAIR) 100-50 MCG/DOSE AEPB Inhale 1  puff into the lungs 2 (two) times daily.   Yes [provider]  furosemide (LASIX) 20 MG tablet Take 20 mg by mouth daily.   Yes [provider]  gabapentin (NEURONTIN) 300 MG capsule Take 1,200 mg by mouth 3 (three) times daily.    Yes [provider]  imipramine (TOFRANIL) 50 MG tablet Take 200 mg by mouth at bedtime.   Yes [provider]  metFORMIN (GLUCOPHAGE-XR) 500 MG 24 hr tablet Take 500 mg by mouth 2 (two) times daily. 09/05/15  Yes [provider]  mometasone-formoterol (DULERA) 200-5 MCG/ACT AERO Inhale 2 puffs into the lungs 2 (two) times daily.  08/19/16  Yes Enid BaasKalisetti, Radhika, MD  predniSONE (DELTASONE) 10 MG tablet Take 1 tablet (10 mg total) by mouth daily with breakfast. 60 mg PO (ORAL) x 2 days 50 mg PO (ORAL)  x 2 days 40 mg PO (ORAL)  x 2 days 30 mg PO  (ORAL)  x 2 days 20 mg PO  (ORAL) x 2 days 10 mg PO  (ORAL) x 2 days then stop 09/07/16  Yes Mody, Sital, MD  risperiDONE (RISPERDAL) 2 MG tablet Take 4 mg by mouth at bedtime.   Yes [provider]  albuterol (PROVENTIL HFA;VENTOLIN HFA) 108 (90 Base) MCG/ACT inhaler Inhale 2 puffs into the lungs every 6 (six) hours as needed for wheezing or shortness of breath.    [provider]  guaiFENesin (MUCINEX) 600 MG 12 hr tablet Take 1 tablet (600 mg total) by mouth 2 (two) times daily. 08/19/16   Enid BaasKalisetti, Radhika, MD  ipratropium-albuterol (DUONEB) 0.5-2.5 (3) MG/3ML SOLN Take 3 mLs by nebulization every 6 (six) hours. 08/19/16   Enid BaasKalisetti, Radhika, MD  metoprolol succinate (TOPROL XL) 25 MG 24 hr tablet Take 25 mg by mouth daily.    [provider]      PHYSICAL EXAMINATION:   VITAL SIGNS: Blood pressure (!) 151/94, pulse (!) 103, temperature 98.7 F (37.1 C), temperature source Oral, resp. rate (!) 27, height 6' (1.829 m), weight 72.6 kg (160 lb), SpO2 96 %.  GENERAL:  61 y.o.-year-old patient lying in the bed with no acute distress.  EYES: Pupils equal, round, reactive to light and accommodation. No scleral icterus. Extraocular muscles intact.  HEENT: Head atraumatic, normocephalic. Oropharynx and nasopharynx clear.  NECK:  Supple, no jugular venous distention. No thyroid enlargement, no tenderness.  LUNGS: Normal breath sounds bilaterally, Some wheezing, no crepitation. Positive use of accessory muscles of respiration.  CARDIOVASCULAR: S1, S2 normal. No murmurs, rubs, or gallops.  ABDOMEN: Soft, nontender, nondistended. Bowel sounds present. No organomegaly or mass.  EXTREMITIES: No pedal edema, cyanosis, or clubbing.  NEUROLOGIC: Cranial nerves II  through XII are intact. Muscle strength 4/5 in all extremities. Sensation intact. Gait not checked.  PSYCHIATRIC: The patient is alert and oriented x 3.  SKIN: No obvious rash, lesion, or ulcer.   LABORATORY PANEL:   CBC  Recent Labs Lab 01/07/17 1553  WBC 12.6*  HGB 13.9  HCT 40.2  PLT 312  MCV 78.6*  MCH 27.3  MCHC 34.7  RDW 15.1*  LYMPHSABS 3.8*  MONOABS 1.2*  EOSABS 0.2  BASOSABS 0.1   ------------------------------------------------------------------------------------------------------------------  Chemistries   Recent Labs Lab 01/07/17 1553  NA 117*  K 3.2*  CL 85*  CO2 21*  GLUCOSE 117*  BUN 5*  CREATININE 0.62  CALCIUM 9.0  AST 24  ALT 16*  ALKPHOS 115  BILITOT 1.0   ------------------------------------------------------------------------------------------------------------------ estimated creatinine clearance is 99.6  mL/min (by C-G formula based on SCr of 0.62 mg/dL). ------------------------------------------------------------------------------------------------------------------ No results for input(s): TSH, T4TOTAL, T3FREE, THYROIDAB in the last 72 hours.  Invalid input(s): FREET3   Coagulation profile No results for input(s): INR, PROTIME in the last 168 hours. ------------------------------------------------------------------------------------------------------------------- No results for input(s): DDIMER in the last 72 hours. -------------------------------------------------------------------------------------------------------------------  Cardiac Enzymes  Recent Labs Lab 01/07/17 1553  TROPONINI <0.03   ------------------------------------------------------------------------------------------------------------------ Invalid input(s): POCBNP  ---------------------------------------------------------------------------------------------------------------  Urinalysis    Component Value Date/Time   COLORURINE COLORLESS (A)  01/07/2017 1554   APPEARANCEUR CLEAR (A) 01/07/2017 1554   APPEARANCEUR Clear 05/09/2014 2046   LABSPEC 1.002 (L) 01/07/2017 1554   LABSPEC 1.001 05/09/2014 2046   PHURINE 7.0 01/07/2017 1554   GLUCOSEU NEGATIVE 01/07/2017 1554   GLUCOSEU Negative 05/09/2014 2046   HGBUR NEGATIVE 01/07/2017 1554   BILIRUBINUR NEGATIVE 01/07/2017 1554   BILIRUBINUR Negative 05/09/2014 2046   KETONESUR 5 (A) 01/07/2017 1554   PROTEINUR NEGATIVE 01/07/2017 1554   NITRITE NEGATIVE 01/07/2017 1554   LEUKOCYTESUR NEGATIVE 01/07/2017 1554   LEUKOCYTESUR Negative 05/09/2014 2046     RADIOLOGY: Dg Chest Port 1 View  Result Date: 01/07/2017 CLINICAL DATA:  Shortness of breath EXAM: PORTABLE CHEST 1 VIEW COMPARISON:  09/05/2016 FINDINGS: Chronic hyperinflation. Mild streaky atelectasis at both bases. There is no edema, consolidation, effusion, or pneumothorax. Normal heart size and stable aortic tortuosity. Remote left rib fractures. IMPRESSION: COPD with mild atelectasis at the bases. Electronically Signed   By: Marnee Spring M.D.   On: 01/07/2017 16:22    EKG: Orders placed or performed during the hospital encounter of 01/07/17  . ED EKG  . ED EKG    IMPRESSION AND PLAN:  * Hyponatremia   Likely secondary to dehydration   We will give IV fluid and monitor tomorrow.   We will discontinue his furosemide.  * COPD   He has some wheezing but does not appear to be in severe exacerbation so we will just give nebulizer therapy but no need for steroid at this time  * Diabetes   Continue medications for now and keep sliding scale coverage.  * Hypertension   Continue home medications.  *Smoking   Smoking cessation counseling was done for 4 minutes and offered nicotine patch to him.   All the records are reviewed and case discussed with ED provider. Management plans discussed with the patient, family and they are in agreement.  CODE STATUS: Full code. Code Status History    Date Active Date  Inactive Code Status Order ID Comments User Context   09/05/2016  6:08 PM 09/07/2016  7:37 PM Full Code [id number]  Marguarite Arbour, MD Inpatient   08/14/2016 10:48 AM 08/19/2016  7:57 PM Full Code [id number]  Arnaldo Natal, MD Inpatient   08/06/2016 11:40 PM 08/07/2016  4:34 PM Full Code [id number]  Tonye Royalty, DO Inpatient   05/23/2015 10:14 PM 05/25/2015  2:59 PM Full Code [id number]  Altamese Dilling, MD Inpatient       TOTAL TIME TAKING CARE OF THIS PATIENT: 45 minutes.    Altamese Dilling M.D on 01/07/2017   Between 7am to 6pm - Pager - [phone]  After 6pm go to [email or web address] - password [email or web address]  [facility]  Office  [phone]  CC: Primary care physician; Candie Chroman, DO   Note: This dictation was prepared with Dragon dictation along with smaller phrase technology. Any transcriptional errors that result from this process are unintentional.

## 2017-01-07 NOTE — ED Notes (Signed)
Pt reports that he has not had bath in over a week, pt is malodorous. Pt taken to shower with MD's okay. Pt was bathed and placed in clean hospital clothes.

## 2017-01-07 NOTE — ED Provider Notes (Signed)
[facility] Emergency Department Provider Note  ____________________________________________   First MD Initiated Contact with Patient 01/07/17 1548     (approximate)  I have reviewed the triage vital signs and the nursing notes.   HISTORY  Chief Complaint Shortness of Breath  History is challenging to obtain as the patient is a poor historian  HPI Kristopher Powell is a 61 y.o. male who comes to the emergency department via EMS with shortness of breath for the past 5 days. According to EMS the patient lives in poor conditions and does not have an air conditioning. HEENT the patient says he has been hot at home and has not moved much past 5 days except for drinking water and using his inhaler. He does report moderate shortness of breath worse with exertion and improved with rest. Prior to arrival EMS gave him 3 nebulization treatments along with 125 mg Solu-Medrol.   Past Medical History:  Diagnosis Date  . CHF (congestive heart failure) (HCC)   . COPD (chronic obstructive pulmonary disease) (HCC)   . Diabetes mellitus without complication (HCC)   . Hypertension   . MI (myocardial infarction) [facility]) 2006    Patient Active Problem List   Diagnosis Date Noted  . SIRS (systemic inflammatory response syndrome) (HCC) 09/05/2016  . Acute URI 09/05/2016  . Acute respiratory failure (HCC) 09/05/2016  . Acute on chronic respiratory failure with hypoxia and hypercapnia (HCC) 08/14/2016  . Chest pain 08/07/2016  . Dehydration with hyponatremia 08/06/2016  . Chest pain, rule out acute myocardial infarction 08/06/2016  . CHF (congestive heart failure) (HCC)   . Generalized anxiety disorder 08/14/2015  . Benzodiazepine abuse 08/14/2015  . Acute delirium 08/14/2015  . Diabetes (HCC) 08/14/2015  . COPD (chronic obstructive pulmonary disease) (HCC) 08/14/2015  . Suicidal ideation 08/14/2015  . Hyponatremia 05/23/2015  . Hypokalemia 05/23/2015    Past Surgical  History:  Procedure Laterality Date  . APPENDECTOMY    . CHOLECYSTECTOMY    . CORONARY ANGIOPLASTY WITH STENT PLACEMENT      Prior to Admission medications   Medication Sig Start Date End Date Taking? Authorizing Provider  amLODipine (NORVASC) 5 MG tablet Take 5 mg by mouth daily.   Yes [provider]  aspirin EC 325 MG tablet Take 325 mg by mouth daily.   Yes [provider]  diazepam (VALIUM) 10 MG tablet Take 1 tablet (10 mg total) by mouth 3 (three) times daily. 09/07/16  Yes Mody, Patricia Pesa, MD  doxycycline (VIBRA-TABS) 100 MG tablet Take 1 tablet (100 mg total) by mouth every 12 (twelve) hours. 09/07/16  Yes Mody, Patricia Pesa, MD  esomeprazole (NEXIUM) 40 MG capsule Take 40 mg by mouth daily.   Yes [provider]  Fluticasone-Salmeterol (ADVAIR) 100-50 MCG/DOSE AEPB Inhale 1 puff into the lungs 2 (two) times daily.   Yes [provider]  furosemide (LASIX) 20 MG tablet Take 20 mg by mouth daily.   Yes [provider]  gabapentin (NEURONTIN) 300 MG capsule Take 1,200 mg by mouth 3 (three) times daily.    Yes [provider]  imipramine (TOFRANIL) 50 MG tablet Take 200 mg by mouth at bedtime.   Yes [provider]  metFORMIN (GLUCOPHAGE-XR) 500 MG 24 hr tablet Take 500 mg by mouth 2 (two) times daily. 09/05/15  Yes [provider]  mometasone-formoterol (DULERA) 200-5 MCG/ACT AERO Inhale 2 puffs into the lungs 2 (two) times daily. 08/19/16  Yes Enid Baas, MD  predniSONE (DELTASONE) 10 MG  tablet Take 1 tablet (10 mg total) by mouth daily with breakfast. 60 mg PO (ORAL) x 2 days 50 mg PO (ORAL)  x 2 days 40 mg PO (ORAL)  x 2 days 30 mg PO  (ORAL)  x 2 days 20 mg PO  (ORAL) x 2 days 10 mg PO  (ORAL) x 2 days then stop 09/07/16  Yes Mody, Sital, MD  risperiDONE (RISPERDAL) 2 MG tablet Take 4 mg by mouth at bedtime.   Yes [provider]  albuterol (PROVENTIL HFA;VENTOLIN HFA) 108 (90 Base) MCG/ACT inhaler Inhale 2  puffs into the lungs every 6 (six) hours as needed for wheezing or shortness of breath.    [provider]  guaiFENesin (MUCINEX) 600 MG 12 hr tablet Take 1 tablet (600 mg total) by mouth 2 (two) times daily. 08/19/16   Enid Baas, MD  ipratropium-albuterol (DUONEB) 0.5-2.5 (3) MG/3ML SOLN Take 3 mLs by nebulization every 6 (six) hours. 08/19/16   Enid Baas, MD  metoprolol succinate (TOPROL XL) 25 MG 24 hr tablet Take 25 mg by mouth daily.    [provider]    Allergies Patient has no known allergies.  Family History  Problem Relation Age of Onset  . CAD Mother        deceased 2  . Stroke Father        deceased 45  . Heart attack Brother        deceased 43    Social History Social History  Substance Use Topics  . Smoking status: Current Every Day Smoker    Packs/day: 1.00    Years: 40.00    Types: Cigarettes  . Smokeless tobacco: Never Used  . Alcohol use 7.2 - 14.4 oz/week    12 - 24 Cans of beer per week     Comment: occ    Review of Systems Level V exemption history is limited by the patient's clinical condition  ____________________________________________   PHYSICAL EXAM:  VITAL SIGNS: ED Triage Vitals  Enc Vitals Group     BP      Pulse      Resp      Temp      Temp src      SpO2      Weight      Height      Head Circumference      Peak Flow      Pain Score      Pain Loc      Pain Edu?      Excl. in GC?     Constitutional:Appears uncomfortable and short of breath malodorous and disheveled Eyes: PERRL EOMI. Head: Atraumatic. Nose: No congestion/rhinnorhea. Mouth/Throat: No trismus dry sputum Neck: No stridor.   Cardiovascular: Tachycardic rate, regular rhythm. Grossly normal heart sounds.  Good peripheral circulation. Respiratory: Increased respiratory effort with diffuse wheezing mild accessory muscle use Gastrointestinal: Soft nontender Musculoskeletal: No lower extremity edema   Neurologic:  Normal speech  and language. No gross focal neurologic deficits are appreciated. Skin:  Skin is warm, dry and intact. No rash noted. Psychiatric: Strange affect    ____________________________________________   DIFFERENTIAL  COPD exacerbation, pneumonia, dehydration, rhabdomyolysis, pneumothorax, pulmonary embolism, acute coronary syndrome ____________________________________________   LABS (all labs ordered are listed, but only abnormal results are displayed)  Labs Reviewed  BASIC METABOLIC PANEL - Abnormal; Notable for the following:       Result Value   Sodium 117 (*)    Potassium 3.2 (*)  Chloride 85 (*)    CO2 21 (*)    Glucose, Bld 117 (*)    BUN 5 (*)    All other components within normal limits  HEPATIC FUNCTION PANEL - Abnormal; Notable for the following:    ALT 16 (*)    All other components within normal limits  CBC WITH DIFFERENTIAL/PLATELET - Abnormal; Notable for the following:    WBC 12.6 (*)    MCV 78.6 (*)    RDW 15.1 (*)    Neutro Abs 7.3 (*)    Lymphs Abs 3.8 (*)    Monocytes Absolute 1.2 (*)    All other components within normal limits  BLOOD GAS, VENOUS - Abnormal; Notable for the following:    pH, Ven 7.48 (*)    pCO2, Ven 28 (*)    pO2, Ven 47.0 (*)    All other components within normal limits  URINALYSIS, COMPLETE (UACMP) WITH MICROSCOPIC - Abnormal; Notable for the following:    Color, Urine COLORLESS (*)    APPearance CLEAR (*)    Specific Gravity, Urine 1.002 (*)    Ketones, ur 5 (*)    All other components within normal limits  CK - Abnormal; Notable for the following:    Total CK 40 (*)    All other components within normal limits  BETA-HYDROXYBUTYRIC ACID - Abnormal; Notable for the following:    Beta-Hydroxybutyric Acid 0.61 (*)    All other components within normal limits  BRAIN NATRIURETIC PEPTIDE  TROPONIN I    The patient has hypochloremic hyponatremia although has a low specific gravity on his urine. Unclear if he is dehydrated or if  this is secondary to polydipsia __________________________________________  EKG  ED ECG REPORT I, Merrily Brittle, the attending physician, personally viewed and interpreted this ECG.  Date: 01/07/2017 Rate: 107 Rhythm: Sinus tachycardia QRS Axis: normal Intervals: normal ST/T Wave abnormalities: Nonspecific ST changes Conduction Disturbances: none Narrative Interpretation: Wavy baseline difficult to interpret but no acute disease  ____________________________________________  RADIOLOGY  Chest x-ray shows mild bilateral atelectasis ____________________________________________   PROCEDURES  Procedure(s) performed: no  Procedures  Critical Care performed: yes  CRITICAL CARE Performed by: Merrily Brittle   Total critical care time: 35 minutes  Critical care time was exclusive of separately billable procedures and treating other patients.  Critical care was necessary to treat or prevent imminent or life-threatening deterioration.  Critical care was time spent personally by me on the following activities: development of treatment plan with patient and/or surrogate as well as nursing, discussions with consultants, evaluation of patient's response to treatment, examination of patient, obtaining history from patient or surrogate, ordering and performing treatments and interventions, ordering and review of laboratory studies, ordering and review of radiographic studies, pulse oximetry and re-evaluation of patient's condition.   Observation: no ____________________________________________   INITIAL IMPRESSION / ASSESSMENT AND PLAN / ED COURSE  Pertinent labs & imaging results that were available during my care of the patient were reviewed by me and considered in my medical decision making (see chart for details).  On arrival the patient is to [facility] and confused. Differential is broad but includes rhabdomyolysis dehydration and pneumonia. I obtained a quick chest x-ray  which was grossly normal prior to taking the patient to shower.  The patient remains slightly tachypnea, his lungs are now clear. He has profound hyponatremia of unclear etiology as does appear clinically dehydrated. I'll given him a single liter of fluid and he will require inpatient admission for further treatment  of his COPD and workup and treatment of his profound metabolic derangements.      ____________________________________________   FINAL CLINICAL IMPRESSION(S) / ED DIAGNOSES  Final diagnoses:  Dehydration  COPD exacerbation (HCC)  Hyponatremia      NEW MEDICATIONS STARTED DURING THIS VISIT:  New Prescriptions   No medications on file     Note:  This document was prepared using Dragon voice recognition software and may include unintentional dictation errors.     Merrily Brittleifenbark, Argusta Mcgann, MD 01/07/17 1827

## 2017-01-07 NOTE — ED Notes (Signed)
Date and time results received: 01/07/17   Test: Sodium Critical Value: 117

## 2017-01-08 LAB — OSMOLALITY, URINE: Osmolality, Ur: 52 mOsm/kg — ABNORMAL LOW (ref 300–900)

## 2017-01-08 LAB — BASIC METABOLIC PANEL
ANION GAP: 6 (ref 5–15)
BUN: 9 mg/dL (ref 6–20)
CO2: 24 mmol/L (ref 22–32)
Calcium: 9 mg/dL (ref 8.9–10.3)
Chloride: 99 mmol/L — ABNORMAL LOW (ref 101–111)
Creatinine, Ser: 0.67 mg/dL (ref 0.61–1.24)
GFR calc Af Amer: >60 mL/min (ref >60)
GFR calc non Af Amer: >60 mL/min (ref >60)
GLUCOSE: 159 mg/dL — AB (ref 65–99)
POTASSIUM: 3.6 mmol/L (ref 3.5–5.1)
Sodium: 129 mmol/L — ABNORMAL LOW (ref 135–145)

## 2017-01-08 LAB — CBC
HEMATOCRIT: 37.3 % — AB (ref 40.0–52.0)
HEMOGLOBIN: 13.2 g/dL (ref 13.0–18.0)
MCH: 27.9 pg (ref 26.0–34.0)
MCHC: 35.3 g/dL (ref 32.0–36.0)
MCV: 79.1 fL — AB (ref 80.0–100.0)
Platelets: 291 10*3/uL (ref 150–440)
RBC: 4.72 MIL/uL (ref 4.40–5.90)
RDW: 14.9 % — ABNORMAL HIGH (ref 11.5–14.5)
WBC: 7.8 10*3/uL (ref 3.8–10.6)

## 2017-01-08 LAB — GLUCOSE, CAPILLARY
GLUCOSE-CAPILLARY: 123 mg/dL — AB (ref 65–99)
GLUCOSE-CAPILLARY: 146 mg/dL — AB (ref 65–99)
Glucose-Capillary: 266 mg/dL — ABNORMAL HIGH (ref 65–99)
Glucose-Capillary: 227 mg/dL — ABNORMAL HIGH (ref 65–99)
Glucose-Capillary: 119 mg/dL — ABNORMAL HIGH (ref 65–99)

## 2017-01-08 LAB — OSMOLALITY: Osmolality: 267 mOsm/kg — ABNORMAL LOW (ref 275–295)

## 2017-01-08 NOTE — Progress Notes (Signed)
Dr. Allena KatzPatel notified patient has requested coffee (10+ cups) and water (2+ pitchers) multiple times this morning from multiple staff members. 1500ml fluid restriction order due to Na 129. Patient updated and educated.

## 2017-01-08 NOTE — Progress Notes (Signed)
Patient's room smells of strong cigarette smoke. Pt denied smoking in his room. Pt denied having cigarettes in his belongings and refused me looking in them. Patient educated he cannot smoke on the hospital campus. Patient acknowledged but unsure he 100% comprehends as he is forgetful.

## 2017-01-08 NOTE — Plan of Care (Signed)
Problem: Safety: Goal: Ability to remain free from injury will improve Outcome: Progressing Moderate fall risk. Safe room environment maintained. Patient instructed to call for assistance out of bed.  Problem: Health Behavior/Discharge Planning: Goal: Ability to manage health-related needs will improve Outcome: Progressing Patient appears disheveled. Patient states he lives at home, takes care of all daily needs, able to afford food & has water. Patient is constantly asking for food and drinks throughout the shift.   Dr. Posey Pronto aware- 1500 fluid restriction started this morning at 11am  Problem: Physical Regulation: Goal: Ability to maintain clinical measurements within normal limits will improve Outcome: Progressing Na 129. IVF infusing as ordered. Labs to be re-drawn in the morning.   Problem: Activity: Goal: Ability to tolerate increased activity will improve Outcome: Completed/Met Date Met: 01/08/17 No SOB noted when walking. Lungs WDL. Oxygen saturations WDL on room air.   Problem: Coping: Goal: Level of anxiety will decrease Outcome: Progressing Scheduled valium given as ordered with noted relief. Per patient he has taken it for 20+ years

## 2017-01-08 NOTE — Evaluation (Addendum)
Physical Therapy Evaluation Patient Details Name: Kristopher Powell MRN: [id number] DOB: [birth year] Today's Date: 01/08/2017   History of Present Illness  presented to ER secondary to SOB, weakness; admitted with hyponatremia related to dehydration (current NA 129)  Clinical Impression  Upon evaluation, patient alert and oriented to basic information; follows commands and demonstrates fair insight/safety awareness.  Bilat UE/LE strength and ROM grossly symmetrical and WFL for basic transfers and mobility.  Able to complete bed mobility indep; sit/stand, basic transfers and gait (220') with RW, distant sup/mod indep.  Fair/good cadence and gait speed, completing 10' distance in 7-8 seconds. Mild sway at times with dynamic gait components, but self-corrects without difficulty.  Patient reports functional performance is baseline for him; no acute change in ability reported or noted at this time. Vitals stable and WFL on RA at rest and with exertion; mild SOB, but manages with intermittent rest breaks. As patient appears at baseline level of ability, no acute PT needs identified at this time. Will complete initial order; please re-consult should needs change. Did encourage progressive mobility with nursing throughout remaining hospitalization to maintain strength and cardiopulmonary endurance; patient/nursing informed/aware.    Follow Up Recommendations No PT follow up    Equipment Recommendations  Rolling walker with 5" wheels (has RW in home environment)    Recommendations for Other Services       Precautions / Restrictions Precautions Precautions: Fall Restrictions Weight Bearing Restrictions: No      Mobility  Bed Mobility Overal bed mobility: Modified Independent                Transfers Overall transfer level: Modified independent Equipment used: None                Ambulation/Gait Ambulation/Gait assistance: Supervision;Modified independent (Device/Increase  time) Ambulation Distance (Feet): 220 Feet Assistive device: Rolling walker (2 wheeled)   Gait velocity: 10' walk time, 7-8 seconds   General Gait Details: reciprocal stepping pattern with fair cadence/gait speed; no buckling or LOB.  Mild sway with head turns and direction changes, but able to self-correct without external assist.  Stairs            Wheelchair Mobility    Modified Rankin (Stroke Patients Only)       Balance Overall balance assessment: Needs assistance Sitting-balance support: No upper extremity supported;Feet supported Sitting balance-Leahy Scale: Good     Standing balance support: Bilateral upper extremity supported Standing balance-Leahy Scale: Fair                               Pertinent Vitals/Pain Pain Assessment: No/denies pain    Home Living Family/patient expects to be discharged to:: Private residence Living Arrangements: Alone Available Help at Discharge: Family (son stays with patient at times) Type of Home: House Home Access: Level entry     Home Layout: One level        Prior Function Level of Independence: Independent with assistive device(s)         Comments: Mod indep with RW for ADLs, household mobility; able to drive but does not due to car trouble.  Son assists with errands and community needs. Denies fall history.     Hand Dominance        Extremity/Trunk Assessment   Upper Extremity Assessment Upper Extremity Assessment: Overall WFL for tasks assessed    Lower Extremity Assessment Lower Extremity Assessment: Overall WFL for tasks assessed  Communication      Cognition Arousal/Alertness: Awake/alert Behavior During Therapy: WFL for tasks assessed/performed Overall Cognitive Status: Within Functional Limits for tasks assessed                                        General Comments      Exercises     Assessment/Plan    PT Assessment Patent does not need any  further PT services  PT Problem List         PT Treatment Interventions      PT Goals (Current goals can be found in the Care Plan section)  Acute Rehab PT Goals Patient Stated Goal: to return home PT Goal Formulation: All assessment and education complete, DC therapy    Frequency     Barriers to discharge        Co-evaluation               AM-PAC PT "6 Clicks" Daily Activity  Outcome Measure Difficulty turning over in bed (including adjusting bedclothes, sheets and blankets)?: None Difficulty moving from lying on back to sitting on the side of the bed? : None Difficulty sitting down on and standing up from a chair with arms (e.g., wheelchair, bedside commode, etc,.)?: None Help needed moving to and from a bed to chair (including a wheelchair)?: None Help needed walking in hospital room?: None Help needed climbing 3-5 steps with a railing? : A Little 6 Click Score: 23    End of Session Equipment Utilized During Treatment: Gait belt Activity Tolerance: Patient tolerated treatment well Patient left: in chair;with call bell/phone within reach;with chair alarm set Nurse Communication: Mobility status PT Visit Diagnosis: Muscle weakness (generalized) (M62.81);Difficulty in walking, not elsewhere classified (R26.2)    Time: [id number] PT Time Calculation (min) (ACUTE ONLY): 13 min   Charges:   PT Evaluation $PT Eval Low Complexity: 1 Procedure     PT G Codes:        Lakenzie Mcclafferty H. Manson PasseyBrown, PT, DPT, NCS 01/08/17, 9:50 AM [phone]

## 2017-01-08 NOTE — Progress Notes (Signed)
[facility] - [place] at [facility]                                                                                                                                                                                  Patient Demographics   Kristopher Powell, is a 61 y.o. male, DOB - 1956-05-24, WUJ:[id number]  Admit date - 01/07/2017   Admitting Physician Altamese Dilling, MD  Outpatient Primary MD for the patient is Candie Chroman, DO   LOS - 1  Subjective: Patient admitted with hyponatremia and some shortness of breath  sodium is improved patient is drinking a lot of water and drinking a lot of coffee according to the nursing staff When I went to see him he kept wanting more to water to drink Says shortness of breath is improved   Review of Systems:   CONSTITUTIONAL: No documented fever. No fatigue, weakness. No weight gain, no weight loss.  EYES: No blurry or double vision.  ENT: No tinnitus. No postnasal drip. No redness of the oropharynx.  RESPIRATORY: No cough, no wheeze, no hemoptysis. No dyspnea.  CARDIOVASCULAR: No chest pain. No orthopnea. No palpitations. No syncope.  GASTROINTESTINAL: No nausea, no vomiting or diarrhea. No abdominal pain. No melena or hematochezia.  GENITOURINARY: No dysuria or hematuria.  ENDOCRINE: No polyuria or nocturia. No heat or cold intolerance.  HEMATOLOGY: No anemia. No bruising. No bleeding.  INTEGUMENTARY: No rashes. No lesions.  MUSCULOSKELETAL: No arthritis. No swelling. No gout.  NEUROLOGIC: No numbness, tingling, or ataxia. No seizure-type activity.  PSYCHIATRIC: No anxiety. No insomnia. No ADD.    Vitals:   Vitals:   01/07/17 2028 01/07/17 2100 01/08/17 0451 01/08/17 0842  BP: 140/75  135/70 140/62  Pulse: (!) 117  95 82  Resp: 20  20 18   Temp:  98 F (36.7 C) 98.1 F (36.7 C) 98.3 F (36.8 C)  TempSrc:   Oral Oral  SpO2: 95%  97% 95%  Weight: 170 lb 1.6 oz (77.2 kg)     Height: 6' (1.829 m)       Wt  Readings from Last 3 Encounters:  01/07/17 170 lb 1.6 oz (77.2 kg)  09/07/16 201 lb 1 oz (91.2 kg)  08/19/16 203 lb 4.8 oz (92.2 kg)     Intake/Output Summary (Last 24 hours) at 01/08/17 1254 Last data filed at 01/08/17 1239  Gross per 24 hour  Intake          2248.33 ml  Output             2375 ml  Net          -126.67 ml    Physical Exam:  GENERAL: Pleasant-appearing in no apparent distress.  HEAD, EYES, EARS, NOSE AND THROAT: Atraumatic, normocephalic. Extraocular muscles are intact. Pupils equal and reactive to light. Sclerae anicteric. No conjunctival injection. No oro-pharyngeal erythema.  NECK: Supple. There is no jugular venous distention. No bruits, no lymphadenopathy, no thyromegaly.  HEART: Regular rate and rhythm,. No murmurs, no rubs, no clicks.  LUNGS: Clear to auscultation bilaterally. No rales or rhonchi. No wheezes.  ABDOMEN: Soft, flat, nontender, nondistended. Has good bowel sounds. No hepatosplenomegaly appreciated.  EXTREMITIES: No evidence of any cyanosis, clubbing, or peripheral edema.  +2 pedal and radial pulses bilaterally.  NEUROLOGIC: The patient is alert, awake, and oriented x3 with no focal motor or sensory deficits appreciated bilaterally.  SKIN: Moist and warm with no rashes appreciated.  Psych: Not anxious, depressed LN: No inguinal LN enlargement    Antibiotics   Anti-infectives    Start     Dose/Rate Route Frequency Ordered Stop   01/07/17 1645  azithromycin (ZITHROMAX) 500 mg in dextrose 5 % 250 mL IVPB     500 mg 250 mL/hr over 60 Minutes Intravenous  Once 01/07/17 1640 01/07/17 1946      Medications   Scheduled Meds: . amLODipine  5 mg Oral Daily  . aspirin EC  325 mg Oral Daily  . diazepam  10 mg Oral TID  . gabapentin  1,200 mg Oral TID  . heparin  5,000 Units Subcutaneous Q8H  . imipramine  200 mg Oral QHS  . insulin aspart  0-9 Units Subcutaneous TID WC  . metFORMIN  500 mg Oral BID WC  . metoprolol succinate  25 mg Oral  Daily  . mometasone-formoterol  2 puff Inhalation BID  . nicotine  21 mg Transdermal Daily  . pantoprazole  40 mg Oral Daily  . risperiDONE  4 mg Oral QHS   Continuous Infusions: . sodium chloride 50 mL/hr at 01/08/17 1105   PRN Meds:.docusate sodium, guaiFENesin, ipratropium-albuterol   Data Review:   Micro Results No results found for this or any previous visit (from the past 240 hour(s)).  Radiology Reports Dg Chest Port 1 View  Result Date: 01/07/2017 CLINICAL DATA:  Shortness of breath EXAM: PORTABLE CHEST 1 VIEW COMPARISON:  09/05/2016 FINDINGS: Chronic hyperinflation. Mild streaky atelectasis at both bases. There is no edema, consolidation, effusion, or pneumothorax. Normal heart size and stable aortic tortuosity. Remote left rib fractures. IMPRESSION: COPD with mild atelectasis at the bases. Electronically Signed   By: Marnee Spring M.D.   On: 01/07/2017 16:22     CBC  Recent Labs Lab 01/07/17 1553 01/08/17 0437  WBC 12.6* 7.8  HGB 13.9 13.2  HCT 40.2 37.3*  PLT 312 291  MCV 78.6* 79.1*  MCH 27.3 27.9  MCHC 34.7 35.3  RDW 15.1* 14.9*  LYMPHSABS 3.8*  --   MONOABS 1.2*  --   EOSABS 0.2  --   BASOSABS 0.1  --     Chemistries   Recent Labs Lab 01/07/17 1553 01/08/17 0437  NA 117* 129*  K 3.2* 3.6  CL 85* 99*  CO2 21* 24  GLUCOSE 117* 159*  BUN 5* 9  CREATININE 0.62 0.67  CALCIUM 9.0 9.0  AST 24  --   ALT 16*  --   ALKPHOS 115  --   BILITOT 1.0  --    ------------------------------------------------------------------------------------------------------------------ estimated creatinine clearance is 105.9 mL/min (by C-G formula based on SCr of 0.67 mg/dL). ------------------------------------------------------------------------------------------------------------------ No results for input(s): HGBA1C in the last 72 hours. ------------------------------------------------------------------------------------------------------------------ No  results  for input(s): CHOL, HDL, LDLCALC, TRIG, CHOLHDL, LDLDIRECT in the last 72 hours. ------------------------------------------------------------------------------------------------------------------ No results for input(s): TSH, T4TOTAL, T3FREE, THYROIDAB in the last 72 hours.  Invalid input(s): FREET3 ------------------------------------------------------------------------------------------------------------------ No results for input(s): VITAMINB12, FOLATE, FERRITIN, TIBC, IRON, RETICCTPCT in the last 72 hours.  Coagulation profile No results for input(s): INR, PROTIME in the last 168 hours.  No results for input(s): DDIMER in the last 72 hours.  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   We will place 1500 cc fluid restriction and repeat sodium in the morning   Lasix has been discontinued   I'll decrease his normal saline rate  * COPD  no evidence of exasperation currently stable, continue when necessary nebulizer  * Diabetes Blood glucose labile but will continue his current regimen  * Hypertension   Continue home medications.  *Smoking   Smoking cessation counseling was done for 4 minutes and offered nicotine patch to him.       Code Status Orders        Start     Ordered   01/07/17 1949  Full code  Continuous     01/07/17 1948    Code Status History    Date Active Date Inactive Code Status Order ID Comments User Context   09/05/2016  6:08 PM 09/07/2016  7:37 PM Full Code [id number]  Marguarite ArbourSparks, Jeffrey D, MD Inpatient   08/14/2016 10:48 AM 08/19/2016  7:57 PM Full Code [id number]  Arnaldo Nataliamond, Michael S, MD Inpatient    08/06/2016 11:40 PM 08/07/2016  4:34 PM Full Code [id number]  Tonye RoyaltyHugelmeyer, Alexis, DO Inpatient   05/23/2015 10:14 PM 05/25/2015  2:59 PM Full Code [id number]  Altamese DillingVachhani, Vaibhavkumar, MD Inpatient           Consults  none  DVT Prophylaxis  Lovenox   Lab Results  Component Value Date   PLT 291 01/08/2017     Time Spent in minutes  35min  Greater than 50% of time spent in care coordination and counseling patient regarding the condition and plan of care.   Auburn BilberryPATEL, Marjan Rosman M.D on 01/08/2017 at 12:54 PM  Between 7am to 6pm - Pager - [phone]  After 6pm go to [email or web address] - password [email or web address] [facility]  [facility]   Office  [phone]

## 2017-01-09 LAB — BASIC METABOLIC PANEL
ANION GAP: 7 (ref 5–15)
BUN: 8 mg/dL (ref 6–20)
CHLORIDE: 105 mmol/L (ref 101–111)
CO2: 25 mmol/L (ref 22–32)
Calcium: 8.3 mg/dL — ABNORMAL LOW (ref 8.9–10.3)
Creatinine, Ser: 0.42 mg/dL — ABNORMAL LOW (ref 0.61–1.24)
GFR calc Af Amer: >60 mL/min (ref >60)
GLUCOSE: 110 mg/dL — AB (ref 65–99)
POTASSIUM: 3.5 mmol/L (ref 3.5–5.1)
Sodium: 137 mmol/L (ref 135–145)

## 2017-01-09 LAB — GLUCOSE, CAPILLARY
Glucose-Capillary: 104 mg/dL — ABNORMAL HIGH (ref 65–99)
Glucose-Capillary: 82 mg/dL (ref 65–99)

## 2017-01-09 LAB — HIV ANTIBODY (ROUTINE TESTING W REFLEX): HIV Screen 4th Generation wRfx: NONREACTIVE

## 2017-01-09 NOTE — Discharge Instructions (Signed)
[facility] - [place] at [facility]  DIET:  Diabetic diet  DISCHARGE CONDITION:  Stable  ACTIVITY:  Use roling walker to ambulate at home  OXYGEN:  Home Oxygen: No.   Oxygen Delivery: room air  DISCHARGE LOCATION:  home   ADDITIONAL DISCHARGE INSTRUCTION: please limit water intake to 1800 cc per day   If you experience worsening of your admission symptoms, develop shortness of breath, life threatening emergency, suicidal or homicidal thoughts you must seek medical attention immediately by calling 911 or calling your MD immediately  if symptoms less severe.  You Must read complete instructions/literature along with all the possible adverse reactions/side effects for all the Medicines you take and that have been prescribed to you. Take any new Medicines after you have completely understood and accpet all the possible adverse reactions/side effects.   Please note  You were cared for by a hospitalist during your hospital stay. If you have any questions about your discharge medications or the care you received while you were in the hospital after you are discharged, you can call the unit and asked to speak with the hospitalist on call if the hospitalist that took care of you is not available. Once you are discharged, your primary care physician will handle any further medical issues. Please note that NO REFILLS for any discharge medications will be authorized once you are discharged, as it is imperative that you return to your primary care physician (or establish a relationship with a primary care physician if you do not have one) for your aftercare needs so that they can reassess your need for medications and monitor your lab values.

## 2017-01-09 NOTE — Discharge Summary (Signed)
[facility] - [place] at [facility], 61 y.o., DOB December 14, 1955, MRN [id number]. Admission date: 01/07/2017 Discharge Date 01/09/2017 Primary MD Candie Chroman, DO Admitting Physician Altamese Dilling, MD  Admission Diagnosis  Dehydration [E86.0] Hyponatremia [E87.1] COPD exacerbation [facility]) [J44.1]  Discharge Diagnosis   Principal Problem:   Hyponatremia   COPD with mild exasperation on admission   Diabetes2  Essential hypertension  Nicotine abuse     Hospital Course Kristopher Powell  is a 61 y.o. male with a known history of COPD, CHF, diabetes, hypertension, coronary artery disease- for last 2-3 days feeling more weak and started draining shortness of breath today so decided to come to emergency room. He said he was not eating or drinking enough. He was on lasix that was stopped, he was given NS. Patient during hospitalization was noted to be drinking a lot of fluid. He was recommended to fluid restrict. However not certain if if he will follow this recommendation. His sodium is back to normal. He did have some breathing trouble on admission. And received some nebulizer therapy his breathing is back to baseline. He was seen by physical therapy who recommended him to use a walker which I recommended for him he states that he has one at home. Currently has stable for discharge.       Dg Chest Port 1 View  Result Date: 01/07/2017 CLINICAL DATA:  Shortness of breath EXAM: PORTABLE CHEST 1 VIEW COMPARISON:  09/05/2016 FINDINGS: Chronic hyperinflation. Mild streaky atelectasis at both bases. There is no edema, consolidation, effusion, or pneumothorax. Normal heart size and stable aortic tortuosity. Remote left rib fractures. IMPRESSION: COPD with mild atelectasis at the bases. Electronically Signed   By: Marnee Spring M.D.   On: 01/07/2017 16:22       Today   Subjective:   Kristopher Powell feels okay denies any complaints   Objective:   Blood  pressure 136/85, pulse (!) 103, temperature 98.2 F (36.8 C), temperature source Oral, resp. rate 18, height 6' (1.829 m), weight 170 lb 1.6 oz (77.2 kg), SpO2 99 %.  .  Intake/Output Summary (Last 24 hours) at 01/09/17 1217 Last data filed at 01/09/17 1121  Gross per 24 hour  Intake             1560 ml  Output             2800 ml  Net            -1240 ml    Exam VITAL SIGNS: Blood pressure 136/85, pulse (!) 103, temperature 98.2 F (36.8 C), temperature source Oral, resp. rate 18, height 6' (1.829 m), weight 170 lb 1.6 oz (77.2 kg), SpO2 99 %.  GENERAL:  61 y.o.-year-old patient lying in the bed with no acute distress.  EYES: Pupils equal, round, reactive to light and accommodation. No scleral icterus. Extraocular muscles intact.  HEENT: Head atraumatic, normocephalic. Oropharynx and nasopharynx clear.  NECK:  Supple, no jugular venous distention. No thyroid enlargement, no tenderness.  LUNGS: Normal breath sounds bilaterally, no wheezing, rales,rhonchi or crepitation. No use of accessory muscles of respiration.  CARDIOVASCULAR: S1, S2 normal. No murmurs, rubs, or gallops.  ABDOMEN: Soft, nontender, nondistended. Bowel sounds present. No organomegaly or mass.  EXTREMITIES: No pedal edema, cyanosis, or clubbing.  NEUROLOGIC: Cranial nerves II through XII are intact. Muscle strength 5/5 in all extremities. Sensation intact. Gait not checked.  PSYCHIATRIC: The patient is alert and oriented x 3.  SKIN: No obvious  rash, lesion, or ulcer.   Data Review     CBC w Diff: Lab Results  Component Value Date   WBC 7.8 01/08/2017   HGB 13.2 01/08/2017   HGB 11.8 (L) 05/11/2014   HCT 37.3 (L) 01/08/2017   HCT 36.3 (L) 05/11/2014   PLT 291 01/08/2017   PLT 361 05/11/2014   LYMPHOPCT 30 01/07/2017   LYMPHOPCT 15.5 05/11/2014   MONOPCT 9 01/07/2017   MONOPCT 7.4 05/11/2014   EOSPCT 1 01/07/2017   EOSPCT 0.1 05/11/2014   BASOPCT 1 01/07/2017   BASOPCT 0.1 05/11/2014   CMP: Lab Results   Component Value Date   NA 137 01/09/2017   NA 139 05/11/2014   K 3.5 01/09/2017   K 3.8 05/11/2014   CL 105 01/09/2017   CL 105 05/11/2014   CO2 25 01/09/2017   CO2 29 05/11/2014   BUN 8 01/09/2017   BUN 8 05/11/2014   CREATININE 0.42 (L) 01/09/2017   CREATININE 0.69 05/11/2014   PROT 7.2 01/07/2017   PROT 6.2 (L) 05/11/2014   ALBUMIN 4.4 01/07/2017   ALBUMIN 2.7 (L) 05/11/2014   BILITOT 1.0 01/07/2017   BILITOT < 0.1 (L) 05/11/2014   ALKPHOS 115 01/07/2017   ALKPHOS 107 05/11/2014   AST 24 01/07/2017   AST 12 (L) 05/11/2014   ALT 16 (L) 01/07/2017   ALT 12 (L) 05/11/2014  .  Micro Results No results found for this or any previous visit (from the past 240 hour(s)).      Code Status Orders        Start     Ordered   01/07/17 1949  Full code  Continuous     01/07/17 1948    Code Status History    Date Active Date Inactive Code Status Order ID Comments User Context   09/05/2016  6:08 PM 09/07/2016  7:37 PM Full Code [id number]  Marguarite Arbour, MD Inpatient   08/14/2016 10:48 AM 08/19/2016  7:57 PM Full Code [id number]  Arnaldo Natal, MD Inpatient   08/06/2016 11:40 PM 08/07/2016  4:34 PM Full Code [id number]  Tonye Royalty, DO Inpatient   05/23/2015 10:14 PM 05/25/2015  2:59 PM Full Code [id number]  Altamese Dilling, MD Inpatient          Follow-up Information    Candie Chroman, DO Follow up in 1 week(s).   Specialty:  Family Medicine Contact information: [id number] [facility] [place] [address] [phone]           Discharge Medications   Allergies as of 01/09/2017   No Known Allergies     Medication List    STOP taking these medications   doxycycline 100 MG tablet Commonly known as:  VIBRA-TABS   furosemide 20 MG tablet Commonly known as:  LASIX   predniSONE 10 MG tablet Commonly known as:  DELTASONE     TAKE these medications   albuterol 108 (90 Base) MCG/ACT inhaler Commonly  known as:  PROVENTIL HFA;VENTOLIN HFA Inhale 2 puffs into the lungs every 6 (six) hours as needed for wheezing or shortness of breath.   amLODipine 5 MG tablet Commonly known as:  NORVASC Take 5 mg by mouth daily.   aspirin EC 325 MG tablet Take 325 mg by mouth daily.   diazepam 10 MG tablet Commonly known as:  VALIUM Take 1 tablet (10 mg total) by mouth 3 (three) times daily.   Fluticasone-Salmeterol 100-50 MCG/DOSE Aepb Commonly known as:  ADVAIR Inhale 1 puff into the lungs 2 (two) times daily.   gabapentin 300 MG capsule Commonly known as:  NEURONTIN Take 1,200 mg by mouth 3 (three) times daily.   guaiFENesin 600 MG 12 hr tablet Commonly known as:  MUCINEX Take 1 tablet (600 mg total) by mouth 2 (two) times daily.   ipratropium-albuterol 0.5-2.5 (3) MG/3ML Soln Commonly known as:  DUONEB Take 3 mLs by nebulization every 6 (six) hours.   metFORMIN 500 MG 24 hr tablet Commonly known as:  GLUCOPHAGE-XR Take 500 mg by mouth 2 (two) times daily.   mometasone-formoterol 200-5 MCG/ACT Aero Commonly known as:  DULERA Inhale 2 puffs into the lungs 2 (two) times daily.   NEXIUM 40 MG capsule Generic drug:  esomeprazole Take 40 mg by mouth daily.   risperiDONE 2 MG tablet Commonly known as:  RISPERDAL Take 4 mg by mouth at bedtime.   TOFRANIL 50 MG tablet Generic drug:  imipramine Take 200 mg by mouth at bedtime.   TOPROL XL 25 MG 24 hr tablet Generic drug:  metoprolol succinate Take 25 mg by mouth daily.          Total Time in preparing paper work, data evaluation and todays exam - 35 minutes  Auburn BilberryPATEL, Mery Guadalupe M.D on 01/09/2017 at 12:17 PM  [facility]   Office  [phone]

## 2017-01-09 NOTE — Progress Notes (Signed)
Pt admitted for dehydration, hyponatremia and COPD exacerbation.  Dehydration and hyponatremia resolved.  Na now 137.  Pt rec'd IV fluids and has had good appetite.  He is back to baseline with regard to COPD.  Pt is a heavy smoker and was found smoking in the BR last night.  He has been instructed several times and informed about the free, smoking cessation program at the hospital.  Pt has been drinking fluids excessively and was actually put on 1500 ml fluid restriction.  IV removed by nurse tech.  Pt has no one to pick him up.  We were able to obtain taxi voucher.  D/c instructions reviewed.

## 2017-01-09 NOTE — Clinical Social Work Note (Signed)
CSW received a request for a taxi voucher for this patient. [place] outside [place], the CSW is not authorized to meet this request. The CSW directed the RN to the Nurse Supervisor for this need. CSW is signing off.  Argentina PonderKaren Martha Ralpheal Zappone, MSW, Theresia MajorsLCSWA [phone]

## 2017-01-14 ENCOUNTER — Encounter: Payer: Self-pay | Admitting: *Deleted

## 2017-01-14 ENCOUNTER — Inpatient Hospital Stay
Admission: EM | Admit: 2017-01-14 | Discharge: 2017-01-16 | DRG: 641 | Disposition: A | Discharge status: Home / Self Care | Payer: Medicaid Other | Attending: Internal Medicine | Admitting: Internal Medicine

## 2017-01-14 ENCOUNTER — Emergency Department: Payer: Medicaid Other

## 2017-01-14 DIAGNOSIS — E119 Type 2 diabetes mellitus without complications: Secondary | ICD-10-CM | POA: Diagnosis present

## 2017-01-14 DIAGNOSIS — Z7984 Long term (current) use of oral hypoglycemic drugs: Secondary | ICD-10-CM | POA: Diagnosis not present

## 2017-01-14 DIAGNOSIS — Z7951 Long term (current) use of inhaled steroids: Secondary | ICD-10-CM | POA: Diagnosis not present

## 2017-01-14 DIAGNOSIS — I509 Heart failure, unspecified: Secondary | ICD-10-CM | POA: Diagnosis present

## 2017-01-14 DIAGNOSIS — Z9049 Acquired absence of other specified parts of digestive tract: Secondary | ICD-10-CM | POA: Diagnosis not present

## 2017-01-14 DIAGNOSIS — Z7982 Long term (current) use of aspirin: Secondary | ICD-10-CM | POA: Diagnosis not present

## 2017-01-14 DIAGNOSIS — E876 Hypokalemia: Secondary | ICD-10-CM | POA: Diagnosis not present

## 2017-01-14 DIAGNOSIS — E871 Hypo-osmolality and hyponatremia: Secondary | ICD-10-CM | POA: Diagnosis present

## 2017-01-14 DIAGNOSIS — I252 Old myocardial infarction: Secondary | ICD-10-CM

## 2017-01-14 DIAGNOSIS — Z955 Presence of coronary angioplasty implant and graft: Secondary | ICD-10-CM

## 2017-01-14 DIAGNOSIS — Z8249 Family history of ischemic heart disease and other diseases of the circulatory system: Secondary | ICD-10-CM | POA: Diagnosis not present

## 2017-01-14 DIAGNOSIS — F17210 Nicotine dependence, cigarettes, uncomplicated: Secondary | ICD-10-CM | POA: Diagnosis present

## 2017-01-14 DIAGNOSIS — I2510 Atherosclerotic heart disease of native coronary artery without angina pectoris: Secondary | ICD-10-CM | POA: Diagnosis present

## 2017-01-14 DIAGNOSIS — E860 Dehydration: Principal | ICD-10-CM | POA: Diagnosis present

## 2017-01-14 DIAGNOSIS — Z823 Family history of stroke: Secondary | ICD-10-CM | POA: Diagnosis not present

## 2017-01-14 DIAGNOSIS — F411 Generalized anxiety disorder: Secondary | ICD-10-CM | POA: Diagnosis present

## 2017-01-14 DIAGNOSIS — R0600 Dyspnea, unspecified: Secondary | ICD-10-CM

## 2017-01-14 DIAGNOSIS — J449 Chronic obstructive pulmonary disease, unspecified: Secondary | ICD-10-CM | POA: Diagnosis present

## 2017-01-14 DIAGNOSIS — I110 Hypertensive heart disease with heart failure: Secondary | ICD-10-CM | POA: Diagnosis present

## 2017-01-14 LAB — CBC WITH DIFFERENTIAL/PLATELET
Basophils Absolute: 0 10*3/uL (ref 0–0.1)
Basophils Relative: 0 %
EOS ABS: 0 10*3/uL (ref 0–0.7)
Eosinophils Relative: 0 %
HCT: 34.9 % — ABNORMAL LOW (ref 40.0–52.0)
HEMOGLOBIN: 12.5 g/dL — AB (ref 13.0–18.0)
LYMPHS ABS: 0.8 10*3/uL — AB (ref 1.0–3.6)
Lymphocytes Relative: 4 %
MCH: 27.4 pg (ref 26.0–34.0)
MCHC: 35.8 g/dL (ref 32.0–36.0)
MCV: 76.7 fL — AB (ref 80.0–100.0)
MONOS PCT: 10 %
Monocytes Absolute: 1.9 10*3/uL — ABNORMAL HIGH (ref 0.2–1.0)
NEUTROS PCT: 86 %
Neutro Abs: 16.9 10*3/uL — ABNORMAL HIGH (ref 1.4–6.5)
Platelets: 354 10*3/uL (ref 150–440)
RBC: 4.54 MIL/uL (ref 4.40–5.90)
RDW: 14.8 % — ABNORMAL HIGH (ref 11.5–14.5)
WBC: 19.7 10*3/uL — ABNORMAL HIGH (ref 3.8–10.6)

## 2017-01-14 LAB — TROPONIN I: Troponin I: 0.04 ng/mL (ref <0.03)

## 2017-01-14 LAB — BLOOD GAS, VENOUS
ACID-BASE DEFICIT: 2.1 mmol/L — AB (ref 0.0–2.0)
BICARBONATE: 19.8 mmol/L — AB (ref 20.0–28.0)
O2 SAT: 80 %
PATIENT TEMPERATURE: 37
PCO2 VEN: 26 mmHg — AB (ref 44.0–60.0)
PO2 VEN: 40 mmHg (ref 32.0–45.0)
pH, Ven: 7.49 — ABNORMAL HIGH (ref 7.250–7.430)

## 2017-01-14 LAB — BASIC METABOLIC PANEL
Anion gap: 11 (ref 5–15)
CHLORIDE: 80 mmol/L — AB (ref 101–111)
CO2: 19 mmol/L — ABNORMAL LOW (ref 22–32)
CREATININE: 0.73 mg/dL (ref 0.61–1.24)
Calcium: 8.6 mg/dL — ABNORMAL LOW (ref 8.9–10.3)
GFR calc Af Amer: >60 mL/min (ref >60)
GFR calc non Af Amer: >60 mL/min (ref >60)
GLUCOSE: 177 mg/dL — AB (ref 65–99)
POTASSIUM: 2.3 mmol/L — AB (ref 3.5–5.1)
Sodium: 110 mmol/L — CL (ref 135–145)

## 2017-01-14 LAB — URINALYSIS, COMPLETE (UACMP) WITH MICROSCOPIC
BILIRUBIN URINE: NEGATIVE
Bacteria, UA: NONE SEEN
Glucose, UA: >=500 mg/dL — AB
HGB URINE DIPSTICK: NEGATIVE
KETONES UR: 20 mg/dL — AB
LEUKOCYTES UA: NEGATIVE
Nitrite: NEGATIVE
PROTEIN: NEGATIVE mg/dL
RBC / HPF: NONE SEEN RBC/hpf (ref 0–5)
Specific Gravity, Urine: 1.006 (ref 1.005–1.030)
Squamous Epithelial / HPF: NONE SEEN
pH: 6 (ref 5.0–8.0)

## 2017-01-14 LAB — BRAIN NATRIURETIC PEPTIDE: B Natriuretic Peptide: 23 pg/mL (ref 0.0–100.0)

## 2017-01-14 MED ORDER — LORAZEPAM 2 MG/ML IJ SOLN
1.0000 mg | Freq: Once | INTRAMUSCULAR | Status: AC
  Administered 2017-01-14: 1 mg via INTRAVENOUS
  Filled 2017-01-14: qty 1

## 2017-01-14 MED ORDER — POTASSIUM CHLORIDE CRYS ER 20 MEQ PO TBCR
40.0000 meq | EXTENDED_RELEASE_TABLET | Freq: Once | ORAL | Status: AC
  Administered 2017-01-14: 40 meq via ORAL
  Filled 2017-01-14: qty 2

## 2017-01-14 MED ORDER — LEVOFLOXACIN IN D5W 750 MG/150ML IV SOLN
750.0000 mg | Freq: Once | INTRAVENOUS | Status: AC
  Administered 2017-01-14: 750 mg via INTRAVENOUS
  Filled 2017-01-14: qty 150

## 2017-01-14 MED ORDER — POTASSIUM CHLORIDE IN NACL 20-0.9 MEQ/L-% IV SOLN
Freq: Once | INTRAVENOUS | Status: AC
  Administered 2017-01-14: 20:00:00 via INTRAVENOUS
  Filled 2017-01-14: qty 1000

## 2017-01-14 MED ORDER — ONDANSETRON HCL 4 MG/2ML IJ SOLN
4.0000 mg | Freq: Once | INTRAMUSCULAR | Status: AC
  Administered 2017-01-14: 4 mg via INTRAVENOUS
  Filled 2017-01-14: qty 2

## 2017-01-14 NOTE — H&P (Signed)
[facility] - [place] at [facility]   PATIENT NAME: Kristopher Powell    MR#:  [id number]  DATE OF BIRTH:  06-Nov-1955  DATE OF ADMISSION:  01/14/2017  PRIMARY CARE PHYSICIAN: Candie Chroman, DO   REQUESTING/REFERRING PHYSICIAN: Mayford Knife, MD  CHIEF COMPLAINT:   Chief Complaint  Patient presents with  . Shortness of Breath    HISTORY OF PRESENT ILLNESS:  Kristopher Powell  is a 61 y.o. male who presents with Weakness and shortness of breath. Patient was just recently here for COPD exacerbation and hyponatremia. He states that he had symptoms just like this when he came in last time. Sodium today was 110, potassium was 2.3. Hospitalists were called for admission  PAST MEDICAL HISTORY:   Past Medical History:  Diagnosis Date  . CHF (congestive heart failure) (HCC)   . COPD (chronic obstructive pulmonary disease) (HCC)   . Diabetes mellitus without complication (HCC)   . Hypertension   . MI (myocardial infarction) (HCC) 2006    PAST SURGICAL HISTORY:   Past Surgical History:  Procedure Laterality Date  . APPENDECTOMY    . CHOLECYSTECTOMY    . CORONARY ANGIOPLASTY WITH STENT PLACEMENT      SOCIAL HISTORY:   Social History  Substance Use Topics  . Smoking status: Current Every Day Smoker    Packs/day: 1.00    Years: 40.00    Types: Cigarettes  . Smokeless tobacco: Never Used  . Alcohol use 7.2 - 14.4 oz/week    12 - 24 Cans of beer per week     Comment: occ    FAMILY HISTORY:   Family History  Problem Relation Age of Onset  . CAD Mother        deceased 40  . Stroke Father        deceased 32  . Heart attack Brother        deceased 33    DRUG ALLERGIES:  No Known Allergies  MEDICATIONS AT HOME:   Prior to Admission medications   Medication Sig Start Date End Date Taking? Authorizing Provider  amLODipine (NORVASC) 5 MG tablet Take 5 mg by mouth daily.   Yes [provider]  aspirin EC 325 MG tablet Take 325 mg by mouth  daily.   Yes [provider]  esomeprazole (NEXIUM) 40 MG capsule Take 40 mg by mouth daily.   Yes [provider]  Fluticasone-Salmeterol (ADVAIR) 100-50 MCG/DOSE AEPB Inhale 1 puff into the lungs 2 (two) times daily.   Yes [provider]  gabapentin (NEURONTIN) 300 MG capsule Take 1,200 mg by mouth 3 (three) times daily.    Yes [provider]  metFORMIN (GLUCOPHAGE-XR) 500 MG 24 hr tablet Take 500 mg by mouth 2 (two) times daily. 09/05/15  Yes [provider]  metoprolol succinate (TOPROL XL) 25 MG 24 hr tablet Take 25 mg by mouth daily.   Yes [provider]  risperiDONE (RISPERDAL) 2 MG tablet Take 4 mg by mouth at bedtime.   Yes [provider]  albuterol (PROVENTIL HFA;VENTOLIN HFA) 108 (90 Base) MCG/ACT inhaler Inhale 2 puffs into the lungs every 6 (six) hours as needed for wheezing or shortness of breath.    [provider]  diazepam (VALIUM) 10 MG tablet Take 1 tablet (10 mg total) by mouth 3 (three) times daily. 09/07/16   Adrian Saran, MD  guaiFENesin (MUCINEX) 600 MG 12 hr tablet Take 1 tablet (600 mg total) by mouth 2 (two) times daily. 08/19/16  Enid Baas, MD  imipramine (TOFRANIL) 50 MG tablet Take 200 mg by mouth at bedtime.    [provider]  ipratropium-albuterol (DUONEB) 0.5-2.5 (3) MG/3ML SOLN Take 3 mLs by nebulization every 6 (six) hours. 08/19/16   Enid Baas, MD  mometasone-formoterol (DULERA) 200-5 MCG/ACT AERO Inhale 2 puffs into the lungs 2 (two) times daily. 08/19/16   Enid Baas, MD    REVIEW OF SYSTEMS:  Review of Systems  Constitutional: Negative for chills, fever, malaise/fatigue and weight loss.  HENT: Negative for ear pain, hearing loss and tinnitus.   Eyes: Negative for blurred vision, double vision, pain and redness.  Respiratory: Positive for shortness of breath. Negative for cough and hemoptysis.   Cardiovascular: Negative for chest pain, palpitations,  orthopnea and leg swelling.  Gastrointestinal: Negative for abdominal pain, constipation, diarrhea, nausea and vomiting.  Genitourinary: Negative for dysuria, frequency and hematuria.  Musculoskeletal: Negative for back pain, joint pain and neck pain.  Skin:       No acne, rash, or lesions  Neurological: Positive for weakness. Negative for dizziness, tremors and focal weakness.  Endo/Heme/Allergies: Negative for polydipsia. Does not bruise/bleed easily.  Psychiatric/Behavioral: Negative for depression. The patient is not nervous/anxious and does not have insomnia.      VITAL SIGNS:   Vitals:   01/14/17 2045 01/14/17 2100 01/14/17 2130 01/14/17 2145  BP:  133/89 104/79   Pulse: 95 92 92 98  Resp: (!) 24 20 17  (!) 25  Temp:      TempSrc:      SpO2: 99% 100% 98% 100%  Weight:      Height:       Wt Readings from Last 3 Encounters:  01/14/17 77.1 kg (170 lb)  01/07/17 77.2 kg (170 lb 1.6 oz)  09/07/16 91.2 kg (201 lb 1 oz)    PHYSICAL EXAMINATION:  Physical Exam  Vitals reviewed. Constitutional: He is oriented to person, place, and time. He appears well-developed and well-nourished. No distress.  HENT:  Head: Normocephalic and atraumatic.  Dry mucous membranes  Eyes: Conjunctivae and EOM are normal. Pupils are equal, round, and reactive to light. No scleral icterus.  Neck: Normal range of motion. Neck supple. No JVD present. No thyromegaly present.  Cardiovascular: Normal rate, regular rhythm and intact distal pulses.  Exam reveals no gallop and no friction rub.   No murmur heard. Respiratory: Effort normal and breath sounds normal. No respiratory distress. He has no wheezes. He has no rales.  GI: Soft. Bowel sounds are normal. He exhibits no distension. There is no tenderness.  Musculoskeletal: Normal range of motion. He exhibits no edema.  No arthritis, no gout  Lymphadenopathy:    He has no cervical adenopathy.  Neurological: He is alert and oriented to person, place, and  time. No cranial nerve deficit.  No dysarthria, no aphasia  Skin: Skin is warm and dry. No rash noted. No erythema.  Psychiatric: He has a normal mood and affect. His behavior is normal. Judgment and thought content normal.    LABORATORY PANEL:   CBC  Recent Labs Lab 01/14/17 1901  WBC 19.7*  HGB 12.5*  HCT 34.9*  PLT 354   ------------------------------------------------------------------------------------------------------------------  Chemistries   Recent Labs Lab 01/14/17 1901  NA 110*  K 2.3*  CL 80*  CO2 19*  GLUCOSE 177*  BUN <5*  CREATININE 0.73  CALCIUM 8.6*   ------------------------------------------------------------------------------------------------------------------  Cardiac Enzymes  Recent Labs Lab 01/14/17 1901  TROPONINI 0.04*   ------------------------------------------------------------------------------------------------------------------  RADIOLOGY:  Dg  Chest Port 1 View  Result Date: 01/14/2017 CLINICAL DATA:  Shortness of Breath EXAM: PORTABLE CHEST 1 VIEW COMPARISON:  01/07/2017 FINDINGS: Cardiac shadow is within normal limits. The lungs are hyperaerated bilaterally. No focal infiltrate or sizable is seen. No bony abnormality is. IMPRESSION: COPD without acute abnormality. Electronically Signed   By: Alcide CleverMark  Lukens M.D.   On: 01/14/2017 19:07    EKG:   Orders placed or performed during the hospital encounter of 01/14/17  . ED EKG  . ED EKG  . EKG 12-Lead  . EKG 12-Lead    IMPRESSION AND PLAN:  Principal Problem:   Dehydration with hyponatremia - patient states that he has poor appetite and mostly just drinks a lot of water. We will hydrate him with IV fluids, monitor sodium closely for expected improvement Active Problems:   Hypokalemia - replace and monitor   COPD (chronic obstructive pulmonary disease) (HCC) - continue home inhalers, not currently in exacerbation. Patient does have a leukocytosis, suspect this is from steroid  use from his last exacerbation, which just finished the last day or so. We will keep him on some azithromycin for now.   Generalized anxiety disorder - continue home meds   Diabetes (HCC) - sliding scale insulin with corresponding glucose checks  All the records are reviewed and case discussed with ED provider. Management plans discussed with the patient and/or family.  DVT PROPHYLAXIS: SubQ lovenox  GI PROPHYLAXIS: PPI  ADMISSION STATUS: Inpatient  CODE STATUS: Full Code Status History    Date Active Date Inactive Code Status Order ID Comments User Context   01/07/2017  7:48 PM 01/09/2017  6:41 PM Full Code [id number]  Altamese DillingVachhani, Vaibhavkumar, MD ED   09/05/2016  6:08 PM 09/07/2016  7:37 PM Full Code [id number]  Marguarite ArbourSparks, Jeffrey D, MD Inpatient   08/14/2016 10:48 AM 08/19/2016  7:57 PM Full Code [id number]  Arnaldo Nataliamond, Michael S, MD Inpatient   08/06/2016 11:40 PM 08/07/2016  4:34 PM Full Code [id number]  Tonye RoyaltyHugelmeyer, Alexis, DO Inpatient   05/23/2015 10:14 PM 05/25/2015  2:59 PM Full Code [id number]  Altamese DillingVachhani, Vaibhavkumar, MD Inpatient      TOTAL TIME TAKING CARE OF THIS PATIENT: 45 minutes.   Zetha Kuhar FIELDING 01/14/2017, 10:40 PM  [facility]  Office  [phone]  CC: Primary care physician; Candie ChromanBarzin, Amir Homayoun, DO  Note:  This document was prepared using Dragon voice recognition software and may include unintentional dictation errors.

## 2017-01-14 NOTE — ED Notes (Signed)
Report given to April RN

## 2017-01-14 NOTE — ED Notes (Signed)
Critical troponin of 0.04, sodium of 110 and potassium of 2.3 called from lab. Dr. Mayford KnifeWilliams notified, new orders for ivf and levaquin received.

## 2017-01-14 NOTE — ED Notes (Signed)
Redraw of BMP complete. Pt assisted up to urinate in urinal. Monitoring equipment replaced.

## 2017-01-14 NOTE — ED Notes (Signed)
Per Barbara CowerJason (Pharmacy) ok to run Levofloxacin and NS with 20 meq/L simultaneously.

## 2017-01-14 NOTE — ED Provider Notes (Signed)
[facility] Emergency Department Provider Note       Time seen: ----------------------------------------- 6:05 PM on 01/14/2017 -----------------------------------------     I have reviewed the triage vital signs and the nursing notes.   HISTORY   Chief Complaint No chief complaint on file.    HPI Kristopher Powell is a 61 y.o. male who presents to the ED for shortness of breath and nausea. Patient was walking to a friend's house when he became short of breath. He was reportedly diaphoretic, tachypnea can tachycardic on EMS arrival. Once the patient was in air conditioning his symptoms improved. He complains of nausea and vomiting but denies other complaints at this time.   Past Medical History:  Diagnosis Date  . CHF (congestive heart failure) (HCC)   . COPD (chronic obstructive pulmonary disease) (HCC)   . Diabetes mellitus without complication (HCC)   . Hypertension   . MI (myocardial infarction) [facility](HCC) 2006    Patient Active Problem List   Diagnosis Date Noted  . SIRS (systemic inflammatory response syndrome) (HCC) 09/05/2016  . Acute URI 09/05/2016  . Acute respiratory failure (HCC) 09/05/2016  . Acute on chronic respiratory failure with hypoxia and hypercapnia (HCC) 08/14/2016  . Chest pain 08/07/2016  . Dehydration with hyponatremia 08/06/2016  . Chest pain, rule out acute myocardial infarction 08/06/2016  . CHF (congestive heart failure) (HCC)   . Generalized anxiety disorder 08/14/2015  . Benzodiazepine abuse 08/14/2015  . Acute delirium 08/14/2015  . Diabetes (HCC) 08/14/2015  . COPD (chronic obstructive pulmonary disease) (HCC) 08/14/2015  . Suicidal ideation 08/14/2015  . Hyponatremia 05/23/2015  . Hypokalemia 05/23/2015    Past Surgical History:  Procedure Laterality Date  . APPENDECTOMY    . CHOLECYSTECTOMY    . CORONARY ANGIOPLASTY WITH STENT PLACEMENT      Allergies Patient has no known allergies.  Social  History Social History  Substance Use Topics  . Smoking status: Current Every Day Smoker    Packs/day: 1.00    Years: 40.00    Types: Cigarettes  . Smokeless tobacco: Never Used  . Alcohol use 7.2 - 14.4 oz/week    12 - 24 Cans of beer per week     Comment: occ    Review of Systems Constitutional: Negative for fever. Eyes: Negative for vision changes ENT:  Negative for congestion, sore throat Cardiovascular: Negative for chest pain. Respiratory: Positive for recent shortness of breath Gastrointestinal: Negative for abdominal pain, positive for nausea and vomiting Genitourinary: Negative for dysuria. Musculoskeletal: Negative for back pain. Skin: Positive for recent diaphoresis Neurological: Negative for headaches, focal weakness or numbness.  All systems negative/normal/unremarkable except as stated in the HPI  ____________________________________________   PHYSICAL EXAM:  VITAL SIGNS: ED Triage Vitals  Enc Vitals Group     BP      Pulse      Resp      Temp      Temp src      SpO2      Weight      Height      Head Circumference      Peak Flow      Pain Score      Pain Loc      Pain Edu?      Excl. in GC?     Constitutional: Alert and oriented.Anxious, mild distress Eyes: Conjunctivae are normal. Normal extraocular movements. ENT   Head: Normocephalic and atraumatic.   Nose: No congestion/rhinnorhea.   Mouth/Throat: Mucous membranes are  moist.   Neck: No stridor. Cardiovascular: Normal rate, regular rhythm. No murmurs, rubs, or gallops. Respiratory: Tachypnea with diminished breath sounds, mostly clear Gastrointestinal: Soft and nontender. Normal bowel sounds Musculoskeletal: Nontender with normal range of motion in extremities. No lower extremity tenderness nor edema. Neurologic:  Normal speech and language. No gross focal neurologic deficits are appreciated.  Skin:  Skin is warm, dry and intact. No rash noted. Psychiatric: Mood and affect  are normal. Speech and behavior are normal.   ____________________________________________  ED COURSE:  Pertinent labs & imaging results that were available during my care of the patient were reviewed by me and considered in my medical decision making (see chart for details). Patient presents for shortness of breath and nausea with possible heat-related illness, we will assess with labs and imaging as indicated.   Procedures ____________________________________________   EKG: Interpreted by me, sinus rhythm baseline artifact, left bundle branch block, long QT  LABS (pertinent positives/negatives)  Labs Reviewed  CBC WITH DIFFERENTIAL/PLATELET - Abnormal; Notable for the following:       Result Value   WBC 19.7 (*)    Hemoglobin 12.5 (*)    HCT 34.9 (*)    MCV 76.7 (*)    RDW 14.8 (*)    Neutro Abs 16.9 (*)    Lymphs Abs 0.8 (*)    Monocytes Absolute 1.9 (*)    All other components within normal limits  BASIC METABOLIC PANEL - Abnormal; Notable for the following:    Sodium 110 (*)    Potassium 2.3 (*)    Chloride 80 (*)    CO2 19 (*)    Glucose, Bld 177 (*)    BUN <5 (*)    Calcium 8.6 (*)    All other components within normal limits  TROPONIN I - Abnormal; Notable for the following:    Troponin I 0.04 (*)    All other components within normal limits  BRAIN NATRIURETIC PEPTIDE  BLOOD GAS, VENOUS    RADIOLOGY Images were viewed by me  Chest x-ray IMPRESSION: COPD without acute abnormality. ____________________________________________  FINAL ASSESSMENT AND PLAN  Dyspnea, nauseaHyponatremia, hypokalemia, leukocytosis  Plan: Patient's labs and imaging were dictated above. Patient had presented for multiple complaints including shortness of breath and vomiting. His sodium and potassium were noted be markedly abnormal. White blood cell count was elevated which is probably partially related to recent steroid use. We will presumptively cover with antibiotics. I will  discuss with the hospitalist for admission.   Emily Filbert, MD   Note: This note was generated in part or whole with voice recognition software. Voice recognition is usually quite accurate but there are transcription errors that can and very often do occur. I apologize for any typographical errors that were not detected and corrected.     Emily Filbert, MD 01/14/17 [phone]

## 2017-01-14 NOTE — ED Notes (Signed)
Another tick removed from pt's scalp. Tick shown to dr. Anne HahnWillis.

## 2017-01-14 NOTE — ED Notes (Signed)
Two ticks removed from pt. Neither were imbedded, both moving on pt.

## 2017-01-14 NOTE — ED Notes (Signed)
Pt sleeping. 

## 2017-01-14 NOTE — ED Triage Notes (Signed)
Per EMS report, patient was walking to a friend's house and became short of breath. Patient was diaphoretic, tachypneic and tachycardic in the 140's upon EMS arrival. Once the patient was in air conditioning, heart rate came down to 110 and shortness of breath resolved. Patient states he took two duoneb treatments prior to walking to his friend's house. Patient arrived in the same clothes that he left here 6 days ago and was taken to the decon shower after seeing Dr. Mayford KnifeWilliams.

## 2017-01-14 NOTE — ED Notes (Signed)
Pt provided with meal tray and po fluids.  

## 2017-01-14 NOTE — ED Notes (Signed)
Pt continues to sleep.

## 2017-01-14 NOTE — ED Notes (Signed)
Patient is back from [place] showers.

## 2017-01-15 LAB — BASIC METABOLIC PANEL
Anion gap: 10 (ref 5–15)
BUN: 5 mg/dL — AB (ref 6–20)
CO2: 23 mmol/L (ref 22–32)
CREATININE: 0.42 mg/dL — AB (ref 0.61–1.24)
Calcium: 8.6 mg/dL — ABNORMAL LOW (ref 8.9–10.3)
Chloride: 87 mmol/L — ABNORMAL LOW (ref 101–111)
GFR calc Af Amer: >60 mL/min (ref >60)
GFR calc non Af Amer: >60 mL/min (ref >60)
Glucose, Bld: 96 mg/dL (ref 65–99)
POTASSIUM: 3 mmol/L — AB (ref 3.5–5.1)
SODIUM: 120 mmol/L — AB (ref 135–145)

## 2017-01-15 LAB — CBC
HCT: 33.3 % — ABNORMAL LOW (ref 40.0–52.0)
Hemoglobin: 11.8 g/dL — ABNORMAL LOW (ref 13.0–18.0)
MCH: 27.5 pg (ref 26.0–34.0)
MCHC: 35.5 g/dL (ref 32.0–36.0)
MCV: 77.6 fL — ABNORMAL LOW (ref 80.0–100.0)
PLATELETS: 341 10*3/uL (ref 150–440)
RBC: 4.3 MIL/uL — ABNORMAL LOW (ref 4.40–5.90)
RDW: 14.9 % — AB (ref 11.5–14.5)
WBC: 15.3 10*3/uL — AB (ref 3.8–10.6)

## 2017-01-15 LAB — GLUCOSE, CAPILLARY
Glucose-Capillary: 104 mg/dL — ABNORMAL HIGH (ref 65–99)
Glucose-Capillary: 108 mg/dL — ABNORMAL HIGH (ref 65–99)
Glucose-Capillary: 109 mg/dL — ABNORMAL HIGH (ref 65–99)
Glucose-Capillary: 131 mg/dL — ABNORMAL HIGH (ref 65–99)
Glucose-Capillary: 150 mg/dL — ABNORMAL HIGH (ref 65–99)

## 2017-01-15 LAB — SODIUM
SODIUM: 126 mmol/L — AB (ref 135–145)
SODIUM: 130 mmol/L — AB (ref 135–145)
Sodium: 126 mmol/L — ABNORMAL LOW (ref 135–145)

## 2017-01-15 LAB — MAGNESIUM: Magnesium: 1.9 mg/dL (ref 1.7–2.4)

## 2017-01-15 MED ORDER — ACETAMINOPHEN 650 MG RE SUPP
650.0000 mg | Freq: Four times a day (QID) | RECTAL | Status: DC | PRN
Start: 2017-01-15 — End: 2017-01-16

## 2017-01-15 MED ORDER — INSULIN ASPART 100 UNIT/ML ~~LOC~~ SOLN
0.0000 [IU] | Freq: Three times a day (TID) | SUBCUTANEOUS | Status: DC
  Administered 2017-01-15: 1 [IU] via SUBCUTANEOUS
  Filled 2017-01-15: qty 1

## 2017-01-15 MED ORDER — ONDANSETRON HCL 4 MG PO TABS: 4.0000 mg | ORAL_TABLET | Freq: Four times a day (QID) | ORAL | Status: DC | PRN

## 2017-01-15 MED ORDER — POTASSIUM CHLORIDE 10 MEQ/100ML IV SOLN
10.0000 meq | INTRAVENOUS | Status: AC
  Administered 2017-01-15 (×4): 10 meq via INTRAVENOUS
  Filled 2017-01-15 (×4): qty 100

## 2017-01-15 MED ORDER — ACETAMINOPHEN 325 MG PO TABS: 650.0000 mg | ORAL_TABLET | Freq: Four times a day (QID) | ORAL | Status: DC | PRN

## 2017-01-15 MED ORDER — ASPIRIN EC 325 MG PO TBEC
325.0000 mg | DELAYED_RELEASE_TABLET | Freq: Every day | ORAL | Status: DC
  Administered 2017-01-15 – 2017-01-16 (×2): 325 mg via ORAL
  Filled 2017-01-15 (×2): qty 1

## 2017-01-15 MED ORDER — ENOXAPARIN SODIUM 40 MG/0.4ML ~~LOC~~ SOLN
40.0000 mg | SUBCUTANEOUS | Status: DC
  Administered 2017-01-15: 40 mg via SUBCUTANEOUS
  Filled 2017-01-15: qty 0.4

## 2017-01-15 MED ORDER — INSULIN ASPART 100 UNIT/ML ~~LOC~~ SOLN: 0.0000 [IU] | Freq: Every day | SUBCUTANEOUS | Status: DC

## 2017-01-15 MED ORDER — GABAPENTIN 400 MG PO CAPS
1200.0000 mg | ORAL_CAPSULE | Freq: Three times a day (TID) | ORAL | Status: DC
  Administered 2017-01-15 – 2017-01-16 (×4): 1200 mg via ORAL
  Filled 2017-01-15 (×4): qty 3

## 2017-01-15 MED ORDER — ONDANSETRON HCL 4 MG/2ML IJ SOLN: 4.0000 mg | Freq: Four times a day (QID) | INTRAMUSCULAR | Status: DC | PRN

## 2017-01-15 MED ORDER — IMIPRAMINE HCL 50 MG PO TABS
200.0000 mg | ORAL_TABLET | Freq: Every day | ORAL | Status: DC
  Administered 2017-01-15 (×2): 200 mg via ORAL
  Filled 2017-01-15 (×3): qty 4

## 2017-01-15 MED ORDER — AZITHROMYCIN 250 MG PO TABS
500.0000 mg | ORAL_TABLET | Freq: Every day | ORAL | Status: DC
  Administered 2017-01-15: 500 mg via ORAL
  Filled 2017-01-15: qty 2

## 2017-01-15 MED ORDER — POTASSIUM CHLORIDE CRYS ER 20 MEQ PO TBCR
40.0000 meq | EXTENDED_RELEASE_TABLET | Freq: Two times a day (BID) | ORAL | Status: DC
  Filled 2017-01-15: qty 2

## 2017-01-15 MED ORDER — METOPROLOL SUCCINATE ER 25 MG PO TB24
25.0000 mg | ORAL_TABLET | Freq: Every day | ORAL | Status: DC
  Administered 2017-01-15 – 2017-01-16 (×2): 25 mg via ORAL
  Filled 2017-01-15 (×2): qty 1

## 2017-01-15 MED ORDER — MOMETASONE FURO-FORMOTEROL FUM 200-5 MCG/ACT IN AERO
2.0000 | INHALATION_SPRAY | Freq: Two times a day (BID) | RESPIRATORY_TRACT | Status: DC
  Administered 2017-01-15 – 2017-01-16 (×4): 2 via RESPIRATORY_TRACT
  Filled 2017-01-15: qty 8.8

## 2017-01-15 MED ORDER — DIAZEPAM 5 MG PO TABS
10.0000 mg | ORAL_TABLET | Freq: Three times a day (TID) | ORAL | Status: DC
  Administered 2017-01-15 – 2017-01-16 (×4): 10 mg via ORAL
  Filled 2017-01-15 (×4): qty 2

## 2017-01-15 MED ORDER — RISPERIDONE 1 MG PO TABS
4.0000 mg | ORAL_TABLET | Freq: Every day | ORAL | Status: DC
  Administered 2017-01-15 (×2): 4 mg via ORAL
  Filled 2017-01-15 (×2): qty 4

## 2017-01-15 MED ORDER — NICOTINE 21 MG/24HR TD PT24
21.0000 mg | MEDICATED_PATCH | Freq: Every day | TRANSDERMAL | Status: DC
  Administered 2017-01-15: 21 mg via TRANSDERMAL
  Filled 2017-01-15: qty 1

## 2017-01-15 MED ORDER — PANTOPRAZOLE SODIUM 40 MG PO TBEC
40.0000 mg | DELAYED_RELEASE_TABLET | Freq: Every day | ORAL | Status: DC
  Administered 2017-01-15 – 2017-01-16 (×2): 40 mg via ORAL
  Filled 2017-01-15 (×2): qty 1

## 2017-01-15 MED ORDER — SODIUM CHLORIDE 0.9 % IV SOLN
INTRAVENOUS | Status: AC
  Administered 2017-01-15: 01:00:00 via INTRAVENOUS

## 2017-01-15 MED ORDER — AMLODIPINE BESYLATE 5 MG PO TABS
5.0000 mg | ORAL_TABLET | Freq: Every day | ORAL | Status: DC
  Administered 2017-01-15 – 2017-01-16 (×2): 5 mg via ORAL
  Filled 2017-01-15 (×2): qty 1

## 2017-01-15 NOTE — Progress Notes (Addendum)
[facility] - [place] at [facility]   PATIENT NAME: Kristopher Powell    MR#:  [id number]  DATE OF BIRTH:  06/19/1956  SUBJECTIVE:  CHIEF COMPLAINT:   Chief Complaint  Patient presents with  . Shortness of Breath   The patient was lethargic this morning. REVIEW OF SYSTEMS:  Review of Systems  Constitutional: Positive for malaise/fatigue. Negative for chills and fever.  HENT: Negative for congestion.   Eyes: Negative for blurred vision and double vision.  Respiratory: Negative for cough, shortness of breath, wheezing and stridor.   Cardiovascular: Negative for chest pain, orthopnea and leg swelling.  Gastrointestinal: Negative for abdominal pain, blood in stool, melena, nausea and vomiting.  Genitourinary: Negative for dysuria and hematuria.  Musculoskeletal: Negative for back pain.  Skin: Negative for rash.  Neurological: Negative for dizziness, focal weakness, loss of consciousness, weakness and headaches.  Psychiatric/Behavioral: Negative for depression. The patient is not nervous/anxious.     DRUG ALLERGIES:  No Known Allergies VITALS:  Blood pressure 100/62, pulse 88, temperature 97.7 F (36.5 C), temperature source Oral, resp. rate 18, height 6' (1.829 m), weight 170 lb (77.1 kg), SpO2 100 %. PHYSICAL EXAMINATION:  Physical Exam  Constitutional: He is oriented to person, place, and time and well-developed, well-nourished, and in no distress.  HENT:  Mouth/Throat: Oropharynx is clear and moist. No oropharyngeal exudate.  Eyes: Conjunctivae and EOM are normal. No scleral icterus.  Neck: Normal range of motion. Neck supple. No JVD present. No tracheal deviation present.  Cardiovascular: Normal rate, regular rhythm and normal heart sounds.  Exam reveals no gallop.   No murmur heard. Pulmonary/Chest: Effort normal and breath sounds normal. No respiratory distress. He has no wheezes. He has no rales.  Abdominal: Soft. Bowel sounds are normal. He exhibits no  distension. There is no tenderness.  Musculoskeletal: Normal range of motion. He exhibits no edema or tenderness.  Neurological: He is oriented to person, place, and time. No cranial nerve deficit.  Skin: No rash noted. No erythema.  Psychiatric: Affect normal.   LABORATORY PANEL:  Male CBC  Recent Labs Lab 01/15/17 0055  WBC 15.3*  HGB 11.8*  HCT 33.3*  PLT 341   ------------------------------------------------------------------------------------------------------------------ Chemistries   Recent Labs Lab 01/15/17 0055  01/15/17 1216  NA 120*  < > 126*  K 3.0*  --   --   CL 87*  --   --   CO2 23  --   --   GLUCOSE 96  --   --   BUN 5*  --   --   CREATININE 0.42*  --   --   CALCIUM 8.6*  --   --   MG  --   --  1.9  < > = values in this interval not displayed. RADIOLOGY:  Dg Chest Port 1 View  Result Date: 01/14/2017 CLINICAL DATA:  Shortness of Breath EXAM: PORTABLE CHEST 1 VIEW COMPARISON:  01/07/2017 FINDINGS: Cardiac shadow is within normal limits. The lungs are hyperaerated bilaterally. No focal infiltrate or sizable is seen. No bony abnormality is. IMPRESSION: COPD without acute abnormality. Electronically Signed   By: Alcide CleverMark  Lukens M.D.   On: 01/14/2017 19:07   ASSESSMENT AND PLAN:   Dehydration with hyponatremia - patient states that he has poor appetite and mostly just drinks a lot of water. Improving with normal saline IV. Fluid restriction. Follow-up sodium level.    Hypokalemia - replaced with potassium and follow-up BMP.    COPD (chronic obstructive pulmonary  disease) (HCC) - continue home inhalers, not currently in exacerbation. Patient does have a leukocytosis, suspect this is from steroid use from his last exacerbation, which just finished the last day or so. We will keep him on some azithromycin for now.   Generalized anxiety disorder - continue home meds   Diabetes (HCC) - sliding scale insulin with corresponding glucose checks  Tobacco abuse. Smoking  cessation was counseled for 4 minutes.  All the records are reviewed and case discussed with Care Management/Social Worker. Management plans discussed with the patient, family and they are in agreement.  CODE STATUS: Full Code  TOTAL TIME TAKING CARE OF THIS PATIENT: 38 minutes.   More than 50% of the time was spent in counseling/coordination of care: YES  POSSIBLE D/C IN 2 DAYS, DEPENDING ON CLINICAL CONDITION.   Shaune Pollack M.D on 01/15/2017 at 2:52 PM  Between 7am to 6pm - Pager - [phone]  After 6pm go to [email or web address] - Social research officer, government  [facility] [place] [facility]  Office  [phone]  CC: Primary care physician; Candie Chroman, DO  Note: This dictation was prepared with Dragon dictation along with smaller phrase technology. Any transcriptional errors that result from this process are unintentional.

## 2017-01-16 LAB — SODIUM
Sodium: 133 mmol/L — ABNORMAL LOW (ref 135–145)
Sodium: 133 mmol/L — ABNORMAL LOW (ref 135–145)

## 2017-01-16 LAB — GLUCOSE, CAPILLARY: Glucose-Capillary: 99 mg/dL (ref 65–99)

## 2017-01-16 LAB — POTASSIUM: POTASSIUM: 3.7 mmol/L (ref 3.5–5.1)

## 2017-01-16 MED ORDER — FLUTICASONE-SALMETEROL 100-50 MCG/DOSE IN AEPB: 1.0000 | INHALATION_SPRAY | Freq: Two times a day (BID) | RESPIRATORY_TRACT | 2 refills | Status: AC

## 2017-01-16 NOTE — Progress Notes (Signed)
01/16/2017 12:37 PM  Emeterio ReeveHarmon L Chizmar to be D/C'd Home per MD order.  Discussed prescriptions and follow up appointments with the patient. Prescriptions given to patient, medication list explained in detail. Pt verbalized understanding.  Allergies as of 01/16/2017   No Known Allergies     Medication List    STOP taking these medications   guaiFENesin 600 MG 12 hr tablet Commonly known as:  MUCINEX   mometasone-formoterol 200-5 MCG/ACT Aero Commonly known as:  DULERA     TAKE these medications   albuterol 108 (90 Base) MCG/ACT inhaler Commonly known as:  PROVENTIL HFA;VENTOLIN HFA Inhale 2 puffs into the lungs every 6 (six) hours as needed for wheezing or shortness of breath.   amLODipine 5 MG tablet Commonly known as:  NORVASC Take 5 mg by mouth daily.   aspirin EC 325 MG tablet Take 325 mg by mouth daily.   diazepam 10 MG tablet Commonly known as:  VALIUM Take 1 tablet (10 mg total) by mouth 3 (three) times daily.   Fluticasone-Salmeterol 100-50 MCG/DOSE Aepb Commonly known as:  ADVAIR Inhale 1 puff into the lungs 2 (two) times daily. What changed:  when to take this   gabapentin 300 MG capsule Commonly known as:  NEURONTIN Take 1,200 mg by mouth 3 (three) times daily.   ipratropium-albuterol 0.5-2.5 (3) MG/3ML Soln Commonly known as:  DUONEB Take 3 mLs by nebulization every 6 (six) hours.   metFORMIN 500 MG 24 hr tablet Commonly known as:  GLUCOPHAGE-XR Take 500 mg by mouth 2 (two) times daily.   NEXIUM 40 MG capsule Generic drug:  esomeprazole Take 40 mg by mouth daily.   risperiDONE 2 MG tablet Commonly known as:  RISPERDAL Take 4 mg by mouth at bedtime.   TOFRANIL 50 MG tablet Generic drug:  imipramine Take 200 mg by mouth at bedtime.   TOPROL XL 25 MG 24 hr tablet Generic drug:  metoprolol succinate Take 25 mg by mouth daily.       Vitals:   01/15/17 2058 01/16/17 0509  BP: (!) 101/58 (!) 103/57  Pulse: 78 78  Resp: 18 18  Temp: 98.2 F  (36.8 C) 98.4 F (36.9 C)    Skin clean, dry and intact without evidence of skin break down, no evidence of skin tears noted. IV catheter discontinued intact. Site without signs and symptoms of complications. Dressing and pressure applied. Pt denies pain at this time. No complaints noted.  An After Visit Summary was printed and given to the patient. Patient escorted via WC, and D/C home via private auto.  Bradly Chrisougherty, Isaiahs Chancy E

## 2017-01-16 NOTE — Discharge Summary (Addendum)
[facility] [place] [facility]   PATIENT NAME: Kristopher Powell    MR#:  [id number]  DATE OF BIRTH:  [birth year]  DATE OF ADMISSION:  01/14/2017   ADMITTING PHYSICIAN: Oralia Manis, MD  DATE OF DISCHARGE: 01/16/2017  PRIMARY CARE PHYSICIAN: Candie Chroman, DO   ADMISSION DIAGNOSIS:  Hypokalemia [E87.6] Hyponatremia [E87.1] Dyspnea, unspecified type [R06.00] DISCHARGE DIAGNOSIS:  Principal Problem:   Dehydration with hyponatremia Active Problems:   Hypokalemia   Generalized anxiety disorder   Diabetes (HCC)   COPD (chronic obstructive pulmonary disease) (HCC)  SECONDARY DIAGNOSIS:   Past Medical History:  Diagnosis Date  . CHF (congestive heart failure) (HCC)   . COPD (chronic obstructive pulmonary disease) (HCC)   . Diabetes mellitus without complication (HCC)   . Hypertension   . MI (myocardial infarction) [facility]) 2006   HOSPITAL COURSE:   Dehydration with hyponatremia - patient states that he has poor appetite and mostly just drinks a lot of water. Improved with normal saline IV. Fluid restriction. Na 133.   Hypokalemia - replaced with potassium and improved.  COPD (chronic obstructive pulmonary disease) (HCC) - continue home inhalers, not currently in exacerbation. Patient does have a leukocytosis, suspect this is from steroid use from his last exacerbation, which just finished the last day or so. discontinue azithromycin. Generalized anxiety disorder - continue home meds Diabetes (HCC) - sliding scale insulin with corresponding glucose checks  Tobacco abuse. Smoking cessation was counseled for 4 minutes.  DISCHARGE CONDITIONS:  Stable, discharge to home today. CONSULTS OBTAINED:   DRUG ALLERGIES:  No Known Allergies DISCHARGE MEDICATIONS:   Allergies as of 01/16/2017   No Known Allergies     Medication List    STOP taking these medications   guaiFENesin 600 MG 12 hr tablet Commonly known as:  MUCINEX     mometasone-formoterol 200-5 MCG/ACT Aero Commonly known as:  DULERA     TAKE these medications   albuterol 108 (90 Base) MCG/ACT inhaler Commonly known as:  PROVENTIL HFA;VENTOLIN HFA Inhale 2 puffs into the lungs every 6 (six) hours as needed for wheezing or shortness of breath.   amLODipine 5 MG tablet Commonly known as:  NORVASC Take 5 mg by mouth daily.   aspirin EC 325 MG tablet Take 325 mg by mouth daily.   diazepam 10 MG tablet Commonly known as:  VALIUM Take 1 tablet (10 mg total) by mouth 3 (three) times daily.   Fluticasone-Salmeterol 100-50 MCG/DOSE Aepb Commonly known as:  ADVAIR Inhale 1 puff into the lungs 2 (two) times daily. What changed:  when to take this   gabapentin 300 MG capsule Commonly known as:  NEURONTIN Take 1,200 mg by mouth 3 (three) times daily.   ipratropium-albuterol 0.5-2.5 (3) MG/3ML Soln Commonly known as:  DUONEB Take 3 mLs by nebulization every 6 (six) hours.   metFORMIN 500 MG 24 hr tablet Commonly known as:  GLUCOPHAGE-XR Take 500 mg by mouth 2 (two) times daily.   NEXIUM 40 MG capsule Generic drug:  esomeprazole Take 40 mg by mouth daily.   risperiDONE 2 MG tablet Commonly known as:  RISPERDAL Take 4 mg by mouth at bedtime.   TOFRANIL 50 MG tablet Generic drug:  imipramine Take 200 mg by mouth at bedtime.   TOPROL XL 25 MG 24 hr tablet Generic drug:  metoprolol succinate Take 25 mg by mouth daily.        DISCHARGE INSTRUCTIONS:  See AVS  If you experience worsening of your  admission symptoms, develop shortness of breath, life threatening emergency, suicidal or homicidal thoughts you must seek medical attention immediately by calling 911 or calling your MD immediately  if symptoms less severe.  You Must read complete instructions/literature along with all the possible adverse reactions/side effects for all the Medicines you take and that have been prescribed to you. Take any new Medicines after you have completely  understood and accpet all the possible adverse reactions/side effects.   Please note  You were cared for by a hospitalist during your hospital stay. If you have any questions about your discharge medications or the care you received while you were in the hospital after you are discharged, you can call the unit and asked to speak with the hospitalist on call if the hospitalist that took care of you is not available. Once you are discharged, your primary care physician will handle any further medical issues. Please note that NO REFILLS for any discharge medications will be authorized once you are discharged, as it is imperative that you return to your primary care physician (or establish a relationship with a primary care physician if you do not have one) for your aftercare needs so that they can reassess your need for medications and monitor your lab values.    On the day of Discharge:  VITAL SIGNS:  Blood pressure (!) 103/57, pulse 78, temperature 98.4 F (36.9 C), temperature source Oral, resp. rate 18, height 6' (1.829 m), weight 170 lb (77.1 kg), SpO2 96 %. PHYSICAL EXAMINATION:  GENERAL:  61 y.o.-year-old patient lying in the bed with no acute distress.  EYES: Pupils equal, round, reactive to light and accommodation. No scleral icterus. Extraocular muscles intact.  HEENT: Head atraumatic, normocephalic. Oropharynx and nasopharynx clear.  NECK:  Supple, no jugular venous distention. No thyroid enlargement, no tenderness.  LUNGS: Normal breath sounds bilaterally, no wheezing, rales,rhonchi or crepitation. No use of accessory muscles of respiration.  CARDIOVASCULAR: S1, S2 normal. No murmurs, rubs, or gallops.  ABDOMEN: Soft, non-tender, non-distended. Bowel sounds present. No organomegaly or mass.  EXTREMITIES: No pedal edema, cyanosis, or clubbing.  NEUROLOGIC: Cranial nerves II through XII are intact. Muscle strength 5/5 in all extremities. Sensation intact. Gait not checked.  PSYCHIATRIC:  The patient is alert and oriented x 3.  SKIN: No obvious rash, lesion, or ulcer.  DATA REVIEW:   CBC  Recent Labs Lab 01/15/17 0055  WBC 15.3*  HGB 11.8*  HCT 33.3*  PLT 341    Chemistries   Recent Labs Lab 01/15/17 0055  01/15/17 1216  01/16/17 0120 01/16/17 0645  NA 120*  < > 126*  < > 133* 133*  K 3.0*  --   --   --  3.7  --   CL 87*  --   --   --   --   --   CO2 23  --   --   --   --   --   GLUCOSE 96  --   --   --   --   --   BUN 5*  --   --   --   --   --   CREATININE 0.42*  --   --   --   --   --   CALCIUM 8.6*  --   --   --   --   --   MG  --   --  1.9  --   --   --   < > =  values in this interval not displayed.   Microbiology Results  Results for orders placed or performed during the hospital encounter of 09/05/16  Culture, blood (Routine x 2)     Status: None   Collection Time: 09/05/16  1:12 PM  Result Value Ref Range Status   Specimen Description BLOOD  RIGHT HAND  Final   Special Requests   Final    BOTTLES DRAWN AEROBIC AND ANAEROBIC  AER 7 ML ANA 3 ML   Culture NO GROWTH 5 DAYS  Final   Report Status 09/10/2016 FINAL  Final  Culture, blood (Routine x 2)     Status: None   Collection Time: 09/05/16  1:12 PM  Result Value Ref Range Status   Specimen Description BLOOD  RIGHT AC  Final   Special Requests   Final    BOTTLES DRAWN AEROBIC AND ANAEROBIC  AER 8 ML ANA 9 ML   Culture NO GROWTH 5 DAYS  Final   Report Status 09/10/2016 FINAL  Final  Urine culture     Status: None   Collection Time: 09/05/16  1:12 PM  Result Value Ref Range Status   Specimen Description URINE, RANDOM  Final   Special Requests NONE  Final   Culture   Final    NO GROWTH Performed at [facility], [address] [place], [place] [address]    Report Status 09/06/2016 FINAL  Final  C difficile quick scan w PCR reflex     Status: None   Collection Time: 09/06/16 11:24 PM  Result Value Ref Range Status   C Diff antigen NEGATIVE NEGATIVE Final   C Diff toxin  NEGATIVE NEGATIVE Final   C Diff interpretation No C. difficile detected.  Final    RADIOLOGY:  No results found.   Management plans discussed with the patient, family and they are in agreement.  CODE STATUS: Full Code   TOTAL TIME TAKING CARE OF THIS PATIENT: 33 minutes.    Shaune Pollack M.D on 01/16/2017 at 11:21 AM  Between 7am to 6pm - Pager - [phone]  After 6pm go to [email or web address] - [employer] research officer, government  [facility]  Office  [phone]  CC: Primary care physician; Candie Chroman, DO   Note: This dictation was prepared with Dragon dictation along with smaller phrase technology. Any transcriptional errors that result from this process are unintentional.

## 2017-01-16 NOTE — Discharge Instructions (Signed)
Smoking cessation. ADA diet.

## 2017-01-24 ENCOUNTER — Observation Stay
Admission: EM | Admit: 2017-01-24 | Discharge: 2017-01-25 | Disposition: A | Discharge status: Home / Self Care | Payer: Medicaid Other | Attending: Internal Medicine | Admitting: Internal Medicine

## 2017-01-24 ENCOUNTER — Emergency Department: Payer: Medicaid Other

## 2017-01-24 ENCOUNTER — Encounter: Payer: Self-pay | Admitting: Intensive Care

## 2017-01-24 DIAGNOSIS — K219 Gastro-esophageal reflux disease without esophagitis: Secondary | ICD-10-CM | POA: Diagnosis not present

## 2017-01-24 DIAGNOSIS — Z7982 Long term (current) use of aspirin: Secondary | ICD-10-CM | POA: Diagnosis not present

## 2017-01-24 DIAGNOSIS — Z8249 Family history of ischemic heart disease and other diseases of the circulatory system: Secondary | ICD-10-CM | POA: Diagnosis not present

## 2017-01-24 DIAGNOSIS — F419 Anxiety disorder, unspecified: Secondary | ICD-10-CM | POA: Diagnosis not present

## 2017-01-24 DIAGNOSIS — J449 Chronic obstructive pulmonary disease, unspecified: Secondary | ICD-10-CM | POA: Insufficient documentation

## 2017-01-24 DIAGNOSIS — G2401 Drug induced subacute dyskinesia: Secondary | ICD-10-CM

## 2017-01-24 DIAGNOSIS — E876 Hypokalemia: Secondary | ICD-10-CM | POA: Diagnosis not present

## 2017-01-24 DIAGNOSIS — I110 Hypertensive heart disease with heart failure: Secondary | ICD-10-CM | POA: Insufficient documentation

## 2017-01-24 DIAGNOSIS — E119 Type 2 diabetes mellitus without complications: Secondary | ICD-10-CM | POA: Insufficient documentation

## 2017-01-24 DIAGNOSIS — Z9981 Dependence on supplemental oxygen: Secondary | ICD-10-CM | POA: Diagnosis not present

## 2017-01-24 DIAGNOSIS — I509 Heart failure, unspecified: Secondary | ICD-10-CM | POA: Insufficient documentation

## 2017-01-24 DIAGNOSIS — I10 Essential (primary) hypertension: Secondary | ICD-10-CM | POA: Diagnosis not present

## 2017-01-24 DIAGNOSIS — Z955 Presence of coronary angioplasty implant and graft: Secondary | ICD-10-CM | POA: Diagnosis not present

## 2017-01-24 DIAGNOSIS — R631 Polydipsia: Secondary | ICD-10-CM | POA: Diagnosis not present

## 2017-01-24 DIAGNOSIS — F17210 Nicotine dependence, cigarettes, uncomplicated: Secondary | ICD-10-CM | POA: Insufficient documentation

## 2017-01-24 DIAGNOSIS — F54 Psychological and behavioral factors associated with disorders or diseases classified elsewhere: Secondary | ICD-10-CM

## 2017-01-24 DIAGNOSIS — I252 Old myocardial infarction: Secondary | ICD-10-CM | POA: Insufficient documentation

## 2017-01-24 DIAGNOSIS — F329 Major depressive disorder, single episode, unspecified: Secondary | ICD-10-CM | POA: Diagnosis not present

## 2017-01-24 DIAGNOSIS — E871 Hypo-osmolality and hyponatremia: Principal | ICD-10-CM | POA: Diagnosis present

## 2017-01-24 LAB — BASIC METABOLIC PANEL
ANION GAP: 9 (ref 5–15)
ANION GAP: 9 (ref 5–15)
ANION GAP: 9 (ref 5–15)
BUN: 6 mg/dL (ref 6–20)
BUN: 9 mg/dL (ref 6–20)
BUN: 11 mg/dL (ref 6–20)
CALCIUM: 8.4 mg/dL — AB (ref 8.9–10.3)
CHLORIDE: 91 mmol/L — AB (ref 101–111)
CO2: 22 mmol/L (ref 22–32)
CO2: 24 mmol/L (ref 22–32)
CO2: 24 mmol/L (ref 22–32)
Calcium: 8.9 mg/dL (ref 8.9–10.3)
Calcium: 8.4 mg/dL — ABNORMAL LOW (ref 8.9–10.3)
Chloride: 96 mmol/L — ABNORMAL LOW (ref 101–111)
Chloride: 97 mmol/L — ABNORMAL LOW (ref 101–111)
Creatinine, Ser: 0.62 mg/dL (ref 0.61–1.24)
Creatinine, Ser: 0.75 mg/dL (ref 0.61–1.24)
Creatinine, Ser: 0.68 mg/dL (ref 0.61–1.24)
GFR calc Af Amer: >60 mL/min (ref >60)
GFR calc non Af Amer: >60 mL/min (ref >60)
GLUCOSE: 106 mg/dL — AB (ref 65–99)
Glucose, Bld: 86 mg/dL (ref 65–99)
Glucose, Bld: 97 mg/dL (ref 65–99)
Potassium: 3.1 mmol/L — ABNORMAL LOW (ref 3.5–5.1)
Potassium: 3.5 mmol/L (ref 3.5–5.1)
Potassium: 3.3 mmol/L — ABNORMAL LOW (ref 3.5–5.1)
SODIUM: 130 mmol/L — AB (ref 135–145)
Sodium: 122 mmol/L — ABNORMAL LOW (ref 135–145)
Sodium: 129 mmol/L — ABNORMAL LOW (ref 135–145)

## 2017-01-24 LAB — CBC WITH DIFFERENTIAL/PLATELET
Basophils Absolute: 0.1 10*3/uL (ref 0–0.1)
Basophils Relative: 1 %
Eosinophils Absolute: 0.1 10*3/uL (ref 0–0.7)
Eosinophils Relative: 1 %
HCT: 35.9 % — ABNORMAL LOW (ref 40.0–52.0)
HEMOGLOBIN: 12.6 g/dL — AB (ref 13.0–18.0)
LYMPHS PCT: 25 %
Lymphs Abs: 2.8 10*3/uL (ref 1.0–3.6)
MCH: 27.6 pg (ref 26.0–34.0)
MCHC: 35 g/dL (ref 32.0–36.0)
MCV: 78.7 fL — AB (ref 80.0–100.0)
MONOS PCT: 10 %
Monocytes Absolute: 1.2 10*3/uL — ABNORMAL HIGH (ref 0.2–1.0)
NEUTROS PCT: 63 %
Neutro Abs: 7.3 10*3/uL — ABNORMAL HIGH (ref 1.4–6.5)
Platelets: 470 10*3/uL — ABNORMAL HIGH (ref 150–440)
RBC: 4.56 MIL/uL (ref 4.40–5.90)
RDW: 15.7 % — ABNORMAL HIGH (ref 11.5–14.5)
WBC: 11.4 10*3/uL — AB (ref 3.8–10.6)

## 2017-01-24 LAB — HEPATIC FUNCTION PANEL
ALK PHOS: 94 U/L (ref 38–126)
ALT: 12 U/L — AB (ref 17–63)
AST: 19 U/L (ref 15–41)
Albumin: 4.1 g/dL (ref 3.5–5.0)
BILIRUBIN DIRECT: 0.2 mg/dL (ref 0.1–0.5)
BILIRUBIN INDIRECT: 0.9 mg/dL (ref 0.3–0.9)
TOTAL PROTEIN: 6.9 g/dL (ref 6.5–8.1)
Total Bilirubin: 1.1 mg/dL (ref 0.3–1.2)

## 2017-01-24 LAB — OSMOLALITY, URINE: OSMOLALITY UR: 55 mosm/kg — AB (ref 300–900)

## 2017-01-24 LAB — CK: Total CK: 36 U/L — ABNORMAL LOW (ref 49–397)

## 2017-01-24 LAB — URINALYSIS, COMPLETE (UACMP) WITH MICROSCOPIC
BACTERIA UA: NONE SEEN
Bilirubin Urine: NEGATIVE
Glucose, UA: NEGATIVE mg/dL
Hgb urine dipstick: NEGATIVE
KETONES UR: 5 mg/dL — AB
LEUKOCYTES UA: NEGATIVE
Nitrite: NEGATIVE
PROTEIN: NEGATIVE mg/dL
RBC / HPF: NONE SEEN RBC/hpf (ref 0–5)
SQUAMOUS EPITHELIAL / LPF: NONE SEEN
Specific Gravity, Urine: 1.001 — ABNORMAL LOW (ref 1.005–1.030)
WBC UA: NONE SEEN WBC/hpf (ref 0–5)
pH: 7 (ref 5.0–8.0)

## 2017-01-24 LAB — SODIUM, URINE, RANDOM

## 2017-01-24 LAB — CREATININE, URINE, RANDOM: CREATININE, URINE: 14 mg/dL

## 2017-01-24 LAB — TROPONIN I: Troponin I: <0.03 ng/mL (ref <0.03)

## 2017-01-24 LAB — BRAIN NATRIURETIC PEPTIDE: B Natriuretic Peptide: 11 pg/mL (ref 0.0–100.0)

## 2017-01-24 MED ORDER — ONDANSETRON HCL 4 MG/2ML IJ SOLN: 4.0000 mg | Freq: Four times a day (QID) | INTRAMUSCULAR | Status: DC | PRN

## 2017-01-24 MED ORDER — METOPROLOL SUCCINATE ER 25 MG PO TB24
25.0000 mg | ORAL_TABLET | Freq: Every day | ORAL | Status: DC
  Filled 2017-01-24: qty 1

## 2017-01-24 MED ORDER — ALBUTEROL SULFATE (2.5 MG/3ML) 0.083% IN NEBU: 2.5000 mg | INHALATION_SOLUTION | Freq: Four times a day (QID) | RESPIRATORY_TRACT | Status: DC | PRN

## 2017-01-24 MED ORDER — ASPIRIN EC 325 MG PO TBEC
325.0000 mg | DELAYED_RELEASE_TABLET | Freq: Every day | ORAL | Status: DC
  Administered 2017-01-25: 325 mg via ORAL
  Filled 2017-01-24: qty 1

## 2017-01-24 MED ORDER — ACETAMINOPHEN 325 MG PO TABS
650.0000 mg | ORAL_TABLET | Freq: Four times a day (QID) | ORAL | Status: DC | PRN
Start: 2017-01-24 — End: 2017-01-25

## 2017-01-24 MED ORDER — DIAZEPAM 5 MG PO TABS
10.0000 mg | ORAL_TABLET | Freq: Three times a day (TID) | ORAL | Status: DC | PRN
  Administered 2017-01-24: 10 mg via ORAL
  Filled 2017-01-24: qty 2

## 2017-01-24 MED ORDER — ENOXAPARIN SODIUM 40 MG/0.4ML ~~LOC~~ SOLN
40.0000 mg | SUBCUTANEOUS | Status: DC
  Administered 2017-01-24: 40 mg via SUBCUTANEOUS
  Filled 2017-01-24: qty 0.4

## 2017-01-24 MED ORDER — PANTOPRAZOLE SODIUM 40 MG PO TBEC
40.0000 mg | DELAYED_RELEASE_TABLET | Freq: Every day | ORAL | Status: DC
  Administered 2017-01-25: 40 mg via ORAL
  Filled 2017-01-24: qty 1

## 2017-01-24 MED ORDER — IPRATROPIUM-ALBUTEROL 0.5-2.5 (3) MG/3ML IN SOLN
3.0000 mL | Freq: Once | RESPIRATORY_TRACT | Status: AC
  Administered 2017-01-24: 3 mL via RESPIRATORY_TRACT
  Filled 2017-01-24: qty 3

## 2017-01-24 MED ORDER — RISPERIDONE 1 MG PO TABS
4.0000 mg | ORAL_TABLET | Freq: Every day | ORAL | Status: DC
  Administered 2017-01-24: 4 mg via ORAL
  Filled 2017-01-24: qty 4

## 2017-01-24 MED ORDER — IPRATROPIUM-ALBUTEROL 0.5-2.5 (3) MG/3ML IN SOLN
3.0000 mL | Freq: Four times a day (QID) | RESPIRATORY_TRACT | Status: DC
  Administered 2017-01-24 – 2017-01-25 (×4): 3 mL via RESPIRATORY_TRACT
  Filled 2017-01-24 (×4): qty 3

## 2017-01-24 MED ORDER — MOMETASONE FURO-FORMOTEROL FUM 100-5 MCG/ACT IN AERO
2.0000 | INHALATION_SPRAY | Freq: Two times a day (BID) | RESPIRATORY_TRACT | Status: DC
  Administered 2017-01-25: 2 via RESPIRATORY_TRACT
  Filled 2017-01-24: qty 8.8

## 2017-01-24 MED ORDER — ACETAMINOPHEN 650 MG RE SUPP: 650.0000 mg | Freq: Four times a day (QID) | RECTAL | Status: DC | PRN

## 2017-01-24 MED ORDER — POTASSIUM CHLORIDE IN NACL 20-0.9 MEQ/L-% IV SOLN
INTRAVENOUS | Status: DC
  Administered 2017-01-24: 13:00:00 via INTRAVENOUS
  Filled 2017-01-24 (×2): qty 1000

## 2017-01-24 MED ORDER — AMLODIPINE BESYLATE 5 MG PO TABS
5.0000 mg | ORAL_TABLET | Freq: Every day | ORAL | Status: DC
  Filled 2017-01-24: qty 1

## 2017-01-24 MED ORDER — MAGNESIUM SULFATE 2 GM/50ML IV SOLN
2.0000 g | Freq: Once | INTRAVENOUS | Status: AC
  Administered 2017-01-24: 2 g via INTRAVENOUS
  Filled 2017-01-24: qty 50

## 2017-01-24 MED ORDER — IMIPRAMINE HCL 50 MG PO TABS
200.0000 mg | ORAL_TABLET | Freq: Every day | ORAL | Status: DC
  Administered 2017-01-24: 200 mg via ORAL
  Filled 2017-01-24: qty 4

## 2017-01-24 MED ORDER — ONDANSETRON HCL 4 MG PO TABS
4.0000 mg | ORAL_TABLET | Freq: Four times a day (QID) | ORAL | Status: DC | PRN
Start: 2017-01-24 — End: 2017-01-25

## 2017-01-24 MED ORDER — GABAPENTIN 400 MG PO CAPS
1200.0000 mg | ORAL_CAPSULE | Freq: Three times a day (TID) | ORAL | Status: DC
  Administered 2017-01-24 – 2017-01-25 (×3): 1200 mg via ORAL
  Filled 2017-01-24 (×3): qty 3

## 2017-01-24 MED ORDER — ALBUTEROL SULFATE HFA 108 (90 BASE) MCG/ACT IN AERS: 2.0000 | INHALATION_SPRAY | Freq: Four times a day (QID) | RESPIRATORY_TRACT | Status: DC | PRN

## 2017-01-24 MED ORDER — DIAZEPAM 5 MG PO TABS: 10.0000 mg | ORAL_TABLET | Freq: Three times a day (TID) | ORAL | Status: DC

## 2017-01-24 NOTE — ED Notes (Addendum)
Admitting MD Sona at bedside

## 2017-01-24 NOTE — ED Triage Notes (Signed)
Pt called EMS from home for difficulty breathing and chest pains. Patient reports wearing 4 1/2 L O2 continuously. Patient oxygen is 96% on RA in ER. Currently everyday smoker. EMS vitals 159/107 b/p, 100 O2, 99HR. Patient had 324 aspirin prior to arrival. A&O x4

## 2017-01-24 NOTE — ED Notes (Signed)
Patient given meal tray.

## 2017-01-24 NOTE — Consult Note (Signed)
[place] [facility] Consult   Reason for Consult:  Consult for 61 year old man with a history of mental health issues now presenting with recurrent hyponatremia Referring Physician:  Posey Pronto Patient Identification: EWEN VARNELL MRN:  [id number] Principal Diagnosis: Psychogenic polydipsia Diagnosis:   Patient Active Problem List   Diagnosis Date Noted  . 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 Tardive dyskinesia [G24.01] 01/24/2017  . SIRS (systemic inflammatory response syndrome) ([place]) [R65.10] 09/05/2016  . Acute URI [J06.9] 09/05/2016  . Acute respiratory failure ([place]) [J96.00] 09/05/2016  . Acute on chronic respiratory failure with hypoxia and hypercapnia (HCC) [R48.54, J96.22] 08/14/2016  . Chest pain [R07.9] 08/07/2016  . Dehydration with hyponatremia [E87.1] 08/06/2016  . Chest pain, rule out acute myocardial infarction [R07.9] 08/06/2016  . CHF (congestive heart failure) ([place]) [I50.9]   . Generalized anxiety disorder [F41.1] 08/14/2015  . Benzodiazepine abuse [F13.10] 08/14/2015  . Acute delirium [R41.0] 08/14/2015  . Diabetes ([place]) [E11.9] 08/14/2015  . COPD (chronic obstructive pulmonary disease) ([place]) [J44.9] 08/14/2015  . Suicidal ideation [R45.851] 08/14/2015  . Hyponatremia [E87.1] 05/23/2015  . Hypokalemia [E87.6] 05/23/2015    Total Time spent with patient: 1 hour  Subjective:   MELQUAN ERNSBERGER is a 61 y.o. male patient admitted with "I just couldn't breathe".  HPI:  Patient interviewed chart reviewed. This is a 61 year old man with a history of chronic mental health issues and a past history of alcohol abuse who is now presenting to the hospital with severe hyponatremia. He has presented to the hospital several times over the last couple months it seems with similar symptoms. Patient tells me that he is aware that he drinks a large amount of water at home. In terms of his psychiatric symptoms he says he is feeling pretty good. He denies feeling sad  or depressed. He says that he sleeps fine. Denies auditory or visual hallucinations. Denies any suicidal or homicidal thoughts. He denies that he has been drinking for the last several years or using any other drugs excessively. He still takes psychiatric medicine prescribed by Dr. Leonides Schanz who has been his doctor for years.  Medical history: Recurrent hyponatremia related to polydipsia. Patient also has clear tardive dyskinesia  Substance abuse history: Long-standing history of alcohol abuse and abuse of other drugs especially benzodiazepines. When we used to see the patient regularly for psychiatric consult it was usually because of substance abuse issues. He has been off of alcohol by his report for the last couple years.  Social history: [place] His son is his closest relative. He gets all of his errands completed and his doctors appointments done with the assistance of his son. Patient himself does not feel that his living situation is excessively run down or dirty.  Past Psychiatric History: Patient has a long history of alcohol abuse and probably abuse of prescription drugs. Years ago we used to see this gentleman regularly for concerns about his drinking. Doesn't look like he's had a psych consult in probably 3 years though at this point. He has seen Dr. Leonides Schanz for years for medication management. He has been on the same combination of imipramine, Valium and Risperdal for years. The exact indication is a little unclear. I can't find any indication that at our hospital we ever saw him as having a psychotic disorder. On examination today it's clear that he now has tardive dyskinesia.  Risk to Self: Is patient at risk for suicide?: No Risk to Others:   Prior  Inpatient Therapy:   Prior Outpatient Therapy:    Past Medical History:  Past Medical History:  Diagnosis Date  . CHF (congestive heart failure) ([place])   . COPD (chronic obstructive pulmonary disease) ([place])    . Diabetes mellitus without complication ([place])   . Hypertension   . MI (myocardial infarction) ([place]) 2006    Past Surgical History:  Procedure Laterality Date  . APPENDECTOMY    . CHOLECYSTECTOMY    . CORONARY ANGIOPLASTY WITH STENT PLACEMENT     Family History:  Family History  Problem Relation Age of Onset  . CAD Mother        deceased 14  . Stroke Father        deceased 7  . Heart attack Brother        deceased 25   Family Psychiatric  History: None known except alcohol abuse  Social History:  History  Alcohol Use  . 7.2 - 14.4 oz/week  . 12 - 24 Cans of beer per week    Comment: occ     History  Drug Use No    Social History   Social History  . Marital status: Single    Spouse name: N/A  . Number of children: N/A  . Years of education: N/A   Social History Main Topics  . Smoking status: Current Every Day Smoker    Packs/day: 1.00    Years: 40.00    Types: Cigarettes  . Smokeless tobacco: Never Used  . Alcohol use 7.2 - 14.4 oz/week    12 - 24 Cans of beer per week     Comment: occ  . Drug use: No  . Sexual activity: Not Asked   Other Topics Concern  . None   Social History Narrative  . None   Additional Social History:    Allergies:  No Known Allergies  Labs:  Results for orders placed or performed during the hospital encounter of 01/24/17 (from the past 48 hour(s))  Blood gas, venous     Status: Abnormal (Preliminary result)   Collection Time: 01/24/17 10:44 AM  Result Value Ref Range   pH, Ven 7.47 (H) 7.250 - 7.430   pCO2, Ven 32 (L) 44.0 - 60.0 mmHg   pO2, Ven <31.0 (LL) 32.0 - 45.0 mmHg    Comment: CRITICAL RESULT CALLED TO, READ BACK BY AND VERIFIED WITH: AMBER JONES RN AT 1105 ON [id number]    Bicarbonate 23.3 20.0 - 28.0 mmol/L   Acid-Base Excess 0.3 0.0 - 2.0 mmol/L   O2 Saturation PENDING %   Patient temperature 37.0    Collection site VEIN    Sample type VEIN   Basic metabolic panel     Status: Abnormal   Collection Time:  01/24/17 10:44 AM  Result Value Ref Range   Sodium 122 (L) 135 - 145 mmol/L   Potassium 3.1 (L) 3.5 - 5.1 mmol/L   Chloride 91 (L) 101 - 111 mmol/L   CO2 22 22 - 32 mmol/L   Glucose, Bld 106 (H) 65 - 99 mg/dL   BUN 6 6 - 20 mg/dL   Creatinine, Ser 0.62 0.61 - 1.24 mg/dL   Calcium 8.9 8.9 - 10.3 mg/dL   GFR calc non Af Amer >60 >60 mL/min   GFR calc Af Amer >60 >60 mL/min    Comment: (NOTE) The eGFR has been calculated using the CKD EPI equation. This calculation has not been validated in all clinical situations. eGFR's persistently <60 mL/min signify possible Chronic  Kidney Disease.    Anion gap 9 5 - 15  Hepatic function panel     Status: Abnormal   Collection Time: 01/24/17 10:44 AM  Result Value Ref Range   Total Protein 6.9 6.5 - 8.1 g/dL   Albumin 4.1 3.5 - 5.0 g/dL   AST 19 15 - 41 U/L   ALT 12 (L) 17 - 63 U/L   Alkaline Phosphatase 94 38 - 126 U/L   Total Bilirubin 1.1 0.3 - 1.2 mg/dL   Bilirubin, Direct 0.2 0.1 - 0.5 mg/dL   Indirect Bilirubin 0.9 0.3 - 0.9 mg/dL  Troponin I     Status: None   Collection Time: 01/24/17 10:44 AM  Result Value Ref Range   Troponin I <0.03 <0.03 ng/mL  Brain natriuretic peptide     Status: None   Collection Time: 01/24/17 10:44 AM  Result Value Ref Range   B Natriuretic Peptide 11.0 0.0 - 100.0 pg/mL  CBC with Differential     Status: Abnormal   Collection Time: 01/24/17 10:44 AM  Result Value Ref Range   WBC 11.4 (H) 3.8 - 10.6 K/uL   RBC 4.56 4.40 - 5.90 MIL/uL   Hemoglobin 12.6 (L) 13.0 - 18.0 g/dL   HCT 35.9 (L) 40.0 - 52.0 %   MCV 78.7 (L) 80.0 - 100.0 fL   MCH 27.6 26.0 - 34.0 pg   MCHC 35.0 32.0 - 36.0 g/dL   RDW 15.7 (H) 11.5 - 14.5 %   Platelets 470 (H) 150 - 440 K/uL   Neutrophils Relative % 63 %   Neutro Abs 7.3 (H) 1.4 - 6.5 K/uL   Lymphocytes Relative 25 %   Lymphs Abs 2.8 1.0 - 3.6 K/uL   Monocytes Relative 10 %   Monocytes Absolute 1.2 (H) 0.2 - 1.0 K/uL   Eosinophils Relative 1 %   Eosinophils Absolute 0.1  0 - 0.7 K/uL   Basophils Relative 1 %   Basophils Absolute 0.1 0 - 0.1 K/uL  CK     Status: Abnormal   Collection Time: 01/24/17 10:44 AM  Result Value Ref Range   Total CK 36 (L) 49 - 397 U/L  Urinalysis, Complete w Microscopic     Status: Abnormal   Collection Time: 01/24/17 10:44 AM  Result Value Ref Range   Color, Urine COLORLESS (A) YELLOW   APPearance CLEAR (A) CLEAR   Specific Gravity, Urine 1.001 (L) 1.005 - 1.030   pH 7.0 5.0 - 8.0   Glucose, UA NEGATIVE NEGATIVE mg/dL   Hgb urine dipstick NEGATIVE NEGATIVE   Bilirubin Urine NEGATIVE NEGATIVE   Ketones, ur 5 (A) NEGATIVE mg/dL   Protein, ur NEGATIVE NEGATIVE mg/dL   Nitrite NEGATIVE NEGATIVE   Leukocytes, UA NEGATIVE NEGATIVE   RBC / HPF NONE SEEN 0 - 5 RBC/hpf   WBC, UA NONE SEEN 0 - 5 WBC/hpf   Bacteria, UA NONE SEEN NONE SEEN   Squamous Epithelial / LPF NONE SEEN NONE SEEN  Sodium, urine, random     Status: None   Collection Time: 01/24/17 10:44 AM  Result Value Ref Range   Sodium, Ur <10 mmol/L  Creatinine, urine, random     Status: None   Collection Time: 01/24/17 10:44 AM  Result Value Ref Range   Creatinine, Urine 14 mg/dL  Osmolality, urine     Status: Abnormal   Collection Time: 01/24/17 10:44 AM  Result Value Ref Range   Osmolality, Ur 55 (L) 300 - 900 mOsm/kg  Basic metabolic  panel     Status: Abnormal   Collection Time: 01/24/17  6:20 PM  Result Value Ref Range   Sodium 129 (L) 135 - 145 mmol/L   Potassium 3.5 3.5 - 5.1 mmol/L   Chloride 96 (L) 101 - 111 mmol/L   CO2 24 22 - 32 mmol/L   Glucose, Bld 86 65 - 99 mg/dL   BUN 9 6 - 20 mg/dL   Creatinine, Ser 0.75 0.61 - 1.24 mg/dL   Calcium 8.4 (L) 8.9 - 10.3 mg/dL   GFR calc non Af Amer >60 >60 mL/min   GFR calc Af Amer >60 >60 mL/min    Comment: (NOTE) The eGFR has been calculated using the CKD EPI equation. This calculation has not been validated in all clinical situations. eGFR's persistently <60 mL/min signify possible Chronic  Kidney Disease.    Anion gap 9 5 - 15    Current Facility-Administered Medications  Medication Dose Route Frequency Provider Last Rate Last Dose  . acetaminophen (TYLENOL) tablet 650 mg  650 mg Oral Q6H PRN Fritzi Mandes, MD       Or  . acetaminophen (TYLENOL) suppository 650 mg  650 mg Rectal Q6H PRN Fritzi Mandes, MD      . albuterol (PROVENTIL) (2.5 MG/3ML) 0.083% nebulizer solution 2.5 mg  2.5 mg Nebulization Q6H PRN Fritzi Mandes, MD      . Derrill Memo ON 01/25/2017] amLODipine (NORVASC) tablet 5 mg  5 mg Oral Daily Fritzi Mandes, MD      . Derrill Memo ON 01/25/2017] aspirin EC tablet 325 mg  325 mg Oral Daily Fritzi Mandes, MD      . diazepam (VALIUM) tablet 10 mg  10 mg Oral TID PRN Fritzi Mandes, MD   10 mg at 01/24/17 1656  . enoxaparin (LOVENOX) injection 40 mg  40 mg Subcutaneous Q24H Fritzi Mandes, MD      . gabapentin (NEURONTIN) capsule 1,200 mg  1,200 mg Oral TID Fritzi Mandes, MD   1,200 mg at 01/24/17 1510  . imipramine (TOFRANIL) tablet 200 mg  200 mg Oral QHS Fritzi Mandes, MD      . ipratropium-albuterol (DUONEB) 0.5-2.5 (3) MG/3ML nebulizer solution 3 mL  3 mL Nebulization Q6H Fritzi Mandes, MD   3 mL at 01/24/17 1942  . [START ON 01/25/2017] metoprolol succinate (TOPROL-XL) 24 hr tablet 25 mg  25 mg Oral Daily Fritzi Mandes, MD      . mometasone-formoterol (DULERA) 100-5 MCG/ACT inhaler 2 puff  2 puff Inhalation BID Fritzi Mandes, MD      . ondansetron [facility]) tablet 4 mg  4 mg Oral Q6H PRN Fritzi Mandes, MD       Or  . ondansetron [facility]) injection 4 mg  4 mg Intravenous Q6H PRN Fritzi Mandes, MD      . Derrill Memo ON 01/25/2017] pantoprazole (PROTONIX) EC tablet 40 mg  40 mg Oral Daily Fritzi Mandes, MD      . risperiDONE (RISPERDAL) tablet 4 mg  4 mg Oral QHS Fritzi Mandes, MD        Musculoskeletal: Strength & Muscle Tone: decreased Gait & Station: ataxic Patient leans: N/A  Psychiatric Specialty Exam: Physical Exam  Nursing note and vitals reviewed. Constitutional: He appears well-developed.  HENT:   Head: Normocephalic and atraumatic.  Eyes: Conjunctivae are normal. Pupils are equal, round, and reactive to light.  Neck: Normal range of motion.  Cardiovascular: Regular rhythm and normal heart sounds.   Respiratory: Effort normal.  GI: Soft.  Musculoskeletal: Normal range of motion.  Neurological: He is alert.  Patient is tardive dyskinesia very clearly his face  Skin: Skin is warm and dry.  Psychiatric: Judgment normal. His affect is blunt. His speech is delayed. He is slowed. Thought content is not paranoid. Cognition and memory are normal. He expresses no homicidal and no suicidal ideation.    Review of Systems  Constitutional: Negative.   HENT: Negative.   Eyes: Negative.   Respiratory: Negative.   Cardiovascular: Negative.   Gastrointestinal: Negative.   Musculoskeletal: Negative.   Skin: Negative.   Neurological: Negative.   Psychiatric/Behavioral: Negative for depression, hallucinations, memory loss, substance abuse and suicidal ideas. The patient is not nervous/anxious and does not have insomnia.     Blood pressure 112/64, pulse 90, temperature 97.8 F (36.6 C), temperature source Oral, resp. rate 18, height 6' (1.829 m), weight 77.1 kg (170 lb), SpO2 96 %.Body mass index is 23.06 kg/m.  General Appearance: Disheveled  Eye Contact:  Fair  Speech:  Slow  Volume:  Decreased  Mood:  Euthymic  Affect:  Constricted  Thought Process:  Goal Directed  Orientation:  Full (Time, Place, and Person)  Thought Content:  Logical  Suicidal Thoughts:  No  Homicidal Thoughts:  No  Memory:  Immediate;   Good Recent;   Fair Remote;   Fair  Judgement:  Fair  Insight:  Fair  Psychomotor Activity:  Decreased and TD  Concentration:  Concentration: Fair  Recall:  [employer] of Knowledge:  Fair  Language:  Fair  Akathisia:  No  Handed:  Right  AIMS (if indicated):     Assets:  Housing Social Support  ADL's:  Impaired  Cognition:  Impaired,  Mild  Sleep:        Treatment  Plan Summary: Plan This is a 61 year old man with chronic use of psychiatric medicine and a past history of alcohol abuse. Seems to of recently developed a problem with polydipsia. Patient does have some awareness of it. I talked with him about trying to consciously limit the amount of water he consumed. I showed him the large plastic cup that is kept in the rooms which is just a little bit less than a liter and suggested that about 2 of those a day should be as much fluid as he drinks. Right now he does not meet commitment criteria does not need inpatient psychiatric hospitalization. He has been on the same medications for so long I am a little hesitant to change anything about them. The imipramine certainly could be causing a dry mouth although he is probably use to it by now. Nonetheless that could be one of the things driving that polydipsia. If I were going to change his medicine I might cut down on that. The risperidone clearly has caused him to have tardive dyskinesia. At this point cutting down on it would probably just make the symptoms worse. Follow-up with his outpatient psychiatrist about all of this. I will continue to check on him as needed. As far as his living situation the patient denies that there is anything filthy or inappropriate about it. Since he says that his son checks up on him daily and helps him with all of his social needs I think that would probably be the person to go to with any concerns about the safety of his living situation.  Disposition: Patient does not meet criteria for psychiatric inpatient admission. Supportive therapy provided about ongoing stressors.  Alethia Berthold, MD 01/24/2017 8:09 PM

## 2017-01-24 NOTE — H&P (Signed)
[facility] - [place] at [facility]   PATIENT NAME: Kristopher Powell    MR#:  [id number]  DATE OF BIRTH:  1956-07-18  DATE OF ADMISSION:  01/24/2017  PRIMARY CARE PHYSICIAN: Candie Chroman, DO   REQUESTING/REFERRING PHYSICIAN: dr Lamont Snowball  CHIEF COMPLAINT:  Shortness of breath and found to have low sodium of 122  HISTORY OF PRESENT ILLNESS:  [name]  is a 61 y.o. male with a known history of Depression, COPD with ongoing tobacco abuse on chronic home oxygen (patient reports 4-1/2 L) comes to the emergency room with increasing shortness of breath although his sats are more than 92% on room air. Patient was found in filthy living condition at home. He has history of drinking about 2 2-1/2 gallons of water on a daily basis. He was recently admitted with hyponatremia and today sodium also is 122. No family in the ER. Patient is not the best historian. He tells me his primary care physician is at [facility]  PAST MEDICAL HISTORY:   Past Medical History:  Diagnosis Date  . CHF (congestive heart failure) (HCC)   . COPD (chronic obstructive pulmonary disease) (H[facility])   . Diabetes mellitus without complication (HCC)   . Hypertension   . MI (myocardial infarction) ([facility]) 2006    PAST SURGICAL HISTOIRY:   Past Surgical History:  Procedure Laterality Date  . APPENDECTOMY    . CHOLECYSTECTOMY    . CORONARY ANGIOPLASTY WITH STENT PLACEMENT      SOCIAL HISTORY:   Social History  Substance Use Topics  . Smoking status: Current Every Day Smoker    Packs/day: 1.00    Years: 40.00    Types: Cigarettes  . Smokeless tobacco: Never Used  . Alcohol use 7.2 - 14.4 oz/week    12 - 24 Cans of beer per week     Comment: occ    FAMILY HISTORY:   Family History  Problem Relation Age of Onset  . CAD Mother        deceased 25  . Stroke Father        deceased 72  . Heart attack Brother        deceased 48    DRUG ALLERGIES:  No Known Allergies  REVIEW OF  SYSTEMS:  Review of Systems  Constitutional: Negative for chills, fever and weight loss.  HENT: Negative for ear discharge, ear pain and nosebleeds.   Eyes: Negative for blurred vision, pain and discharge.  Respiratory: Negative for sputum production, shortness of breath, wheezing and stridor.   Cardiovascular: Negative for chest pain, palpitations, orthopnea and PND.  Gastrointestinal: Negative for abdominal pain, diarrhea, nausea and vomiting.  Genitourinary: Negative for frequency and urgency.  Musculoskeletal: Negative for back pain and joint pain.  Neurological: Positive for weakness. Negative for sensory change, speech change and focal weakness.  Endo/Heme/Allergies: Positive for polydipsia.  Psychiatric/Behavioral: Negative for depression and hallucinations. The patient is not nervous/anxious.      MEDICATIONS AT HOME:   Prior to Admission medications   Medication Sig Start Date End Date Taking? Authorizing Provider  albuterol (PROVENTIL HFA;VENTOLIN HFA) 108 (90 Base) MCG/ACT inhaler Inhale 2 puffs into the lungs every 6 (six) hours as needed for wheezing or shortness of breath.   Yes [provider]  amLODipine (NORVASC) 5 MG tablet Take 5 mg by mouth daily.   Yes [provider]  aspirin EC 325 MG tablet Take 325 mg by mouth daily.   Yes [provider]  esomeprazole (  NEXIUM) 40 MG capsule Take 40 mg by mouth daily.   Yes [provider]  Fluticasone-Salmeterol (ADVAIR) 100-50 MCG/DOSE AEPB Inhale 1 puff into the lungs 2 (two) times daily. 01/16/17  Yes Shaune Pollack, MD  gabapentin (NEURONTIN) 300 MG capsule Take 1,200 mg by mouth 3 (three) times daily.    Yes [provider]  imipramine (TOFRANIL) 50 MG tablet Take 200 mg by mouth at bedtime.   Yes [provider]  metoprolol succinate (TOPROL XL) 25 MG 24 hr tablet Take 25 mg by mouth daily.   Yes [provider]  risperiDONE (RISPERDAL) 2 MG tablet Take 4 mg by mouth  at bedtime.   Yes [provider]  diazepam (VALIUM) 10 MG tablet Take 1 tablet (10 mg total) by mouth 3 (three) times daily. 09/07/16   Adrian Saran, MD  ipratropium-albuterol (DUONEB) 0.5-2.5 (3) MG/3ML SOLN Take 3 mLs by nebulization every 6 (six) hours. 08/19/16   Enid Baas, MD      VITAL SIGNS:  Blood pressure 103/65, pulse 83, temperature 97.8 F (36.6 C), temperature source Oral, resp. rate 20, height 6' (1.829 m), weight 77.1 kg (170 lb), SpO2 97 %.  PHYSICAL EXAMINATION:  GENERAL:  61 y.o.-year-old patient lying in the bed with no acute distress.  EYES: Pupils equal, round, reactive to light and accommodation. No scleral icterus. Extraocular muscles intact.  HEENT: Head atraumatic, normocephalic. Oropharynx and nasopharynx clear. Poor dentition  NECK:  Supple, no jugular venous distention. No thyroid enlargement, no tenderness.  LUNGS: Normal breath sounds bilaterally, no wheezing, rales,rhonchi or crepitation. No use of accessory muscles of respiration.  CARDIOVASCULAR: S1, S2 normal. No murmurs, rubs, or gallops.  ABDOMEN: Soft, nontender, nondistended. Bowel sounds present. No organomegaly or mass.  EXTREMITIES: No pedal edema, cyanosis, or clubbing.  NEUROLOGIC: Cranial nerves II through XII are intact. Muscle strength 5/5 in all extremities. Sensation intact. Gait not checked.  PSYCHIATRIC: The patient is alert .  SKIN: No obvious rash, lesion, or ulcer.   LABORATORY PANEL:   CBC  Recent Labs Lab 01/24/17 1044  WBC 11.4*  HGB 12.6*  HCT 35.9*  PLT 470*   ------------------------------------------------------------------------------------------------------------------  Chemistries   Recent Labs Lab 01/24/17 1044  NA 122*  K 3.1*  CL 91*  CO2 22  GLUCOSE 106*  BUN 6  CREATININE 0.62  CALCIUM 8.9  AST 19  ALT 12*  ALKPHOS 94  BILITOT 1.1    ------------------------------------------------------------------------------------------------------------------  Cardiac Enzymes  Recent Labs Lab 01/24/17 1044  TROPONINI <0.03   ------------------------------------------------------------------------------------------------------------------  RADIOLOGY:  Dg Chest 2 View  Result Date: 01/24/2017 CLINICAL DATA:  Shortness of breath and mid chest tightness since this morning. History of CHF, coronary artery disease with stent placement, COPD, current smoker. EXAM: CHEST  2 VIEW COMPARISON:  Chest x-ray of January 14, 2017 FINDINGS: The lungs are hyperinflated with hemidiaphragm flattening. The heart and pulmonary vascularity are normal. The mediastinum is normal in width. There is no pleural effusion. There is calcification in the wall of the aortic arch. There are old deformities of the lateral aspects of the left fifth and sixth ribs. IMPRESSION: COPD. No pneumonia, CHF, nor other acute cardiopulmonary abnormality. Thoracic aortic atherosclerosis. Electronically Signed   By: David  [place] M.D.   On: 01/24/2017 11:35    EKG:    IMPRESSION AND PLAN:   Cleatus Gabriel  is a 61 y.o. male with a known history of Depression, COPD with ongoing tobacco abuse on chronic home oxygen (patient reports 4-1/2  L) comes to the emergency room with increasing shortness of breath although his sats are more than 92% on room air. Patient was found in filthy living condition at home. He has history of drinking about 2 2-1/2 gallons of water on a daily basis.  1. 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 He has history of depression. Patient denies any alcohol abuse. -Admit to medical floor -IV normal saline with potassium -Follow sodium every 4 hours -Nephrology consultation [facility]-We'll consult psychiatry for history of depression and repeated admission for psychogenic polydipsia to see if he is  competent to make decision.   2. COPD with ongoing tobacco abuse -Continue nebs and inhalers -Patient reports using 4-1/2 L of oxygen. His sats are more than 91% on room air he currently is not wheezing -We will use oxygen as needed  3. Hypertension continue home meds  4. Hypokalemia replace IV KCl  5. Social worker for discharge planning. Patient apparently was found in a very filthy living condition. APS has been initiated in the ER  6. DVT prophylaxis subcutaneous Lovenox   All the records are reviewed and case discussed with ED provider. Management plans discussed with the patient, family and they are in agreement.  CODE STATUS: Full   TOTAL TIME TAKING CARE OF THIS PATIENT: 40es.    Todd Jelinski M.D on 01/24/2017 at 1:46 PM  Between 7am to 6pm - Pager - [phone]  After 6pm go to [email or web address] - password [email or web address] [facility]  [facility]  Office  [phone]  CC: Primary care physician; Candie ChromanBarzin, Amir Homayoun, DO

## 2017-01-24 NOTE — Consult Note (Signed)
[facility]  CONSULT NOTE    Date: 01/24/2017                  Patient Name:  Kristopher Powell  MRN: [id number]  DOB: Mar 24, 1956  Age / Sex: 61 y.o., male         PCP: Candie Chroman, DO                 Service Requesting Consult: Dr. Enedina Finner                 Reason for Consult: Hyponatremia            History of Present Illness: Kristopher Powell is a 61 y.o. 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   Patient was admitted to [facility] from 5/25 to 5/27 for hyponatremia. Patient was then treated with IV fluids: normal saline. Sodium went from 117 to 129 and then was discharged with fluid restriction. Then admitted again from 6/1 to 6/3 for sodium of 110 and this improved to 133 on discharge. Was placed on normal saline and fluid restriction.   He presents again with hyponatremia and polydipsia.    His main complaint is shortness of breath. Continues to smoke.   Medications: Outpatient medications: Prescriptions Prior to Admission  Medication Sig Dispense Refill Last Dose  . albuterol (PROVENTIL HFA;VENTOLIN HFA) 108 (90 Base) MCG/ACT inhaler Inhale 2 puffs into the lungs every 6 (six) hours as needed for wheezing or shortness of breath.   01/24/2017 at 0700  . amLODipine (NORVASC) 5 MG tablet Take 5 mg by mouth daily.   01/24/2017 at 0700  . aspirin EC 325 MG tablet Take 325 mg by mouth daily.   01/24/2017 at 0700  . esomeprazole (NEXIUM) 40 MG capsule Take 40 mg by mouth daily.   01/24/2017 at 0700  . Fluticasone-Salmeterol (ADVAIR) 100-50 MCG/DOSE AEPB Inhale 1 puff into the lungs 2 (two) times daily. 60 each 2 01/24/2017 at 0700  . gabapentin (NEURONTIN) 300 MG capsule Take 1,200 mg by mouth 3 (three) times daily.     01/24/2017 at 0700  . imipramine (TOFRANIL) 50 MG tablet Take 200 mg by mouth at bedtime.   01/23/2017 at 2000  . metoprolol succinate (TOPROL XL) 25 MG 24 hr tablet Take 25 mg by mouth daily.   01/24/2017 at 0700  . risperiDONE (RISPERDAL) 2 MG tablet Take 4 mg by mouth at bedtime.   01/24/2017 at 0700  . diazepam (VALIUM) 10 MG tablet Take 1 tablet (10 mg total) by mouth 3 (three) times daily. 90 tablet 0 prn at prn  . ipratropium-albuterol (DUONEB) 0.5-2.5 (3) MG/3ML SOLN Take 3 mLs by nebulization every 6 (six) hours. 360 mL 0 prn at prn    Current medications: Current Facility-Administered Medications  Medication Dose Route Frequency Provider Last Rate Last Dose  . 0.9 % NaCl with KCl 20 mEq/ L  infusion   Intravenous Continuous Enedina Finner, MD 50 mL/hr at 01/24/17 1309    . acetaminophen (TYLENOL) tablet 650 mg  650 mg Oral Q6H PRN Enedina Finner, MD       Or  . acetaminophen (TYLENOL) suppository 650 mg  650 mg Rectal Q6H PRN Enedina Finner, MD      . albuterol (PROVENTIL) (2.5 MG/3ML) 0.083% nebulizer  solution 2.5 mg  2.5 mg Nebulization Q6H PRN Enedina Finner, MD      . Melene Muller ON 01/25/2017] amLODipine (NORVASC) tablet 5 mg  5 mg Oral Daily Enedina Finner, MD      . Melene Muller ON 01/25/2017] aspirin EC tablet 325 mg  325 mg Oral Daily Enedina Finner, MD      . diazepam (VALIUM) tablet 10 mg  10 mg Oral TID PRN Enedina Finner, MD      . enoxaparin (LOVENOX) injection 40 mg  40 mg Subcutaneous Q24H Enedina Finner, MD      . gabapentin (NEURONTIN) capsule 1,200 mg  1,200 mg Oral TID Enedina Finner, MD   1,200 mg at 01/24/17 1510  . imipramine (TOFRANIL) tablet 200 mg  200 mg Oral QHS Enedina Finner, MD      . ipratropium-albuterol (DUONEB) 0.5-2.5 (3) MG/3ML nebulizer solution 3 mL  3 mL Nebulization Q6H Enedina Finner, MD   3 mL at 01/24/17 1403  . [START ON 01/25/2017] metoprolol succinate (TOPROL-XL) 24 hr tablet 25 mg  25 mg Oral Daily Enedina Finner, MD      . mometasone-formoterol (DULERA) 100-5 MCG/ACT inhaler 2 puff  2  puff Inhalation BID Enedina Finner, MD      . ondansetron [facility]) tablet 4 mg  4 mg Oral Q6H PRN Enedina Finner, MD       Or  . ondansetron [facility]) injection 4 mg  4 mg Intravenous Q6H PRN Enedina Finner, MD      . Melene Muller ON 01/25/2017] pantoprazole (PROTONIX) EC tablet 40 mg  40 mg Oral Daily Enedina Finner, MD      . risperiDONE (RISPERDAL) tablet 4 mg  4 mg Oral QHS Enedina Finner, MD          Allergies: No Known Allergies    Past Medical History: Past Medical History:  Diagnosis Date  . CHF (congestive heart failure) (HCC)   . COPD (chronic obstructive pulmonary disease) (HCC)   . Diabetes mellitus without complication (HCC)   . Hypertension   . MI (myocardial infarction) (HCC) 2006     Past Surgical History: Past Surgical History:  Procedure Laterality Date  . APPENDECTOMY    . CHOLECYSTECTOMY    . CORONARY ANGIOPLASTY WITH STENT PLACEMENT       Family History: Family History  Problem Relation Age of Onset  . CAD Mother        deceased 62  . Stroke Father        deceased 20  . Heart attack Brother        deceased 59     Social History: Social History   Social History  . Marital status: Single    Spouse name: N/A  . Number of children: N/A  . Years of education: N/A   Occupational History  . Not on file.   Social History Main Topics  . Smoking status: Current Every Day Smoker    Packs/day: 1.00    Years: 40.00    Types: Cigarettes  . Smokeless tobacco: Never Used  . Alcohol use 7.2 - 14.4 oz/week    12 - 24 Cans of beer per week     Comment: occ  . Drug use: No  . Sexual activity: Not on file   Other Topics Concern  . Not on file   Social History Narrative  . No narrative on file     Review of Systems: Review of Systems  Constitutional: Negative.  Negative for chills, diaphoresis, fever, malaise/fatigue and  weight loss.  HENT: Negative for congestion, ear discharge, ear pain, hearing loss, nosebleeds, sinus pain, sore throat and tinnitus.   Eyes:  Negative for blurred vision, double vision, photophobia, pain, discharge and redness.  Respiratory: Positive for cough, sputum production, shortness of breath and wheezing. Negative for hemoptysis and stridor.   Cardiovascular: Negative for chest pain, palpitations, orthopnea, claudication, leg swelling and PND.  Gastrointestinal: Negative.  Negative for abdominal pain, blood in stool, constipation, diarrhea, heartburn, melena, nausea and vomiting.  Genitourinary: Negative.  Negative for dysuria, flank pain, frequency, hematuria and urgency.  Musculoskeletal: Negative.  Negative for back pain, falls, joint pain, myalgias and neck pain.  Skin: Negative.  Negative for itching and rash.  Neurological: Negative.  Negative for dizziness, tingling, tremors, sensory change, speech change, focal weakness, seizures, loss of consciousness, weakness and headaches.  Endo/Heme/Allergies: Negative.  Negative for environmental allergies and polydipsia. Does not bruise/bleed easily.  Psychiatric/Behavioral: Negative.  Negative for depression, hallucinations, memory loss, substance abuse and suicidal ideas. The patient is not nervous/anxious and does not have insomnia.     Vital Signs: Blood pressure 112/64, pulse 90, temperature 97.8 F (36.6 C), temperature source Oral, resp. rate 18, height 6' (1.829 m), weight 77.1 kg (170 lb), SpO2 98 %.  Weight trends: Filed Weights   01/24/17 1104  Weight: 77.1 kg (170 lb)    Physical Exam: General: NAD,   Head: Normocephalic, atraumatic. Moist oral mucosal membranes  Eyes: Anicteric, PERRL  Neck: Supple, trachea midline  Lungs:  +wheezes  Heart: Regular rate and rhythm  Abdomen:  Soft, nontender,   Extremities: no peripheral edema.  Neurologic: +tardive dyskinesia  Skin: No lesions        Lab results: Basic Metabolic Panel:  Recent Labs Lab 01/24/17 1044  NA 122*  K 3.1*  CL 91*  CO2 22  GLUCOSE 106*  BUN 6  CREATININE 0.62  CALCIUM 8.9     Liver Function Tests:  Recent Labs Lab 01/24/17 1044  AST 19  ALT 12*  ALKPHOS 94  BILITOT 1.1  PROT 6.9  ALBUMIN 4.1   No results for input(s): LIPASE, AMYLASE in the last 168 hours. No results for input(s): AMMONIA in the last 168 hours.  CBC:  Recent Labs Lab 01/24/17 1044  WBC 11.4*  NEUTROABS 7.3*  HGB 12.6*  HCT 35.9*  MCV 78.7*  PLT 470*    Cardiac Enzymes:  Recent Labs Lab 01/24/17 1044  CKTOTAL 36*  TROPONINI <0.03    BNP: Invalid input(s): POCBNP  CBG: No results for input(s): GLUCAP in the last 168 hours.  Microbiology: Results for orders placed or performed during the hospital encounter of 09/05/16  Culture, blood (Routine x 2)     Status: None   Collection Time: 09/05/16  1:12 PM  Result Value Ref Range Status   Specimen Description BLOOD  RIGHT HAND  Final   Special Requests   Final    BOTTLES DRAWN AEROBIC AND ANAEROBIC  AER 7 ML ANA 3 ML   Culture NO GROWTH 5 DAYS  Final   Report Status 09/10/2016 FINAL  Final  Culture, blood (Routine x 2)     Status: None   Collection Time: 09/05/16  1:12 PM  Result Value Ref Range Status   Specimen Description BLOOD  RIGHT AC  Final   Special Requests   Final    BOTTLES DRAWN AEROBIC AND ANAEROBIC  AER 8 ML ANA 9 ML   Culture NO GROWTH 5 DAYS  Final  Report Status 09/10/2016 FINAL  Final  Urine culture     Status: None   Collection Time: 09/05/16  1:12 PM  Result Value Ref Range Status   Specimen Description URINE, RANDOM  Final   Special Requests NONE  Final   Culture   Final    NO GROWTH Performed at [facility], [address] [place], [place] [address]    Report Status 09/06/2016 FINAL  Final  C difficile quick scan w PCR reflex     Status: None   Collection Time: 09/06/16 11:24 PM  Result Value Ref Range Status   C Diff antigen NEGATIVE NEGATIVE Final   C Diff toxin NEGATIVE NEGATIVE Final   C Diff interpretation No C. difficile detected.  Final    Coagulation  Studies: No results for input(s): LABPROT, INR in the last 72 hours.  Urinalysis:  Recent Labs  01/24/17 1044  COLORURINE COLORLESS*  LABSPEC 1.001*  PHURINE 7.0  GLUCOSEU NEGATIVE  HGBUR NEGATIVE  BILIRUBINUR NEGATIVE  KETONESUR 5*  PROTEINUR NEGATIVE  NITRITE NEGATIVE  LEUKOCYTESUR NEGATIVE      Imaging: Dg Chest 2 View  Result Date: 01/24/2017 CLINICAL DATA:  Shortness of breath and mid chest tightness since this morning. History of CHF, coronary artery disease with stent placement, COPD, current smoker. EXAM: CHEST  2 VIEW COMPARISON:  Chest x-ray of January 14, 2017 FINDINGS: The lungs are hyperinflated with hemidiaphragm flattening. The heart and pulmonary vascularity are normal. The mediastinum is normal in width. There is no pleural effusion. There is calcification in the wall of the aortic arch. There are old deformities of the lateral aspects of the left fifth and sixth ribs. IMPRESSION: COPD. No pneumonia, CHF, nor other acute cardiopulmonary abnormality. Thoracic aortic atherosclerosis. Electronically Signed   By: David  SwazilandJordan M.D.   On: 01/24/2017 11:35      Assessment & Plan: Kristopher Powell is a 61 y.o. 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 Hyponatremia 2. Psychogenic polydipsia 3. Hypokalemia 4. Hypertension  Plan: - Start fluid restriction 124100mL/day - Discontinue IV fluids for now - q4 basic metabolic panel - Appreciate psych input.   LOS: 0 Sarika Baldini 6/11/20184:10 PM

## 2017-01-24 NOTE — ED Provider Notes (Signed)
[facility] Emergency Department Provider Note  ____________________________________________   First MD Initiated Contact with Patient 01/24/17 1040     (approximate)  I have reviewed the triage vital signs and the nursing notes.   HISTORY  Chief Complaint Shortness of Breath  Level V exemption history is limited by the patient's altered mental status  HPI ADVIK Powell is a 61 y.o. male who comes to the emergency department via EMS for reported shortness of breath. The patient is a past medical history of COPD and says he has been using his albuterol inhaler more frequently at home although he feels like he cannot quite catch his breath. The patient has an odd affect and his story is confusing and changing. I actually admitted the patient to the hospital one month ago and at that time he was admitted for water intoxication and hyponatremia. Patient reports continuing to drink up to 2 gallons of water a day because he says he is "thirsty".   Past Medical History:  Diagnosis Date  . CHF (congestive heart failure) (HCC)   . COPD (chronic obstructive pulmonary disease) (HCC)   . Diabetes mellitus without complication (HCC)   . Hypertension   . MI (myocardial infarction) [facility]) 2006    Patient Active Problem List   Diagnosis Date Noted  . 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 Tardive dyskinesia 01/24/2017  . SIRS (systemic inflammatory response syndrome) (HCC) 09/05/2016  . Acute URI 09/05/2016  . Acute respiratory failure (HCC) 09/05/2016  . Acute on chronic respiratory failure with hypoxia and hypercapnia (HCC) 08/14/2016  . Chest pain 08/07/2016  . Dehydration with hyponatremia 08/06/2016  . Chest pain, rule out acute myocardial infarction 08/06/2016  . CHF (congestive heart failure) (HCC)   . Generalized anxiety disorder 08/14/2015  . Benzodiazepine abuse 08/14/2015  . Acute delirium 08/14/2015  . Diabetes (HCC) 08/14/2015  . COPD (chronic  obstructive pulmonary disease) (HCC) 08/14/2015  . Suicidal ideation 08/14/2015  . Hyponatremia 05/23/2015  . Hypokalemia 05/23/2015    Past Surgical History:  Procedure Laterality Date  . APPENDECTOMY    . CHOLECYSTECTOMY    . CORONARY ANGIOPLASTY WITH STENT PLACEMENT      Prior to Admission medications   Medication Sig Start Date End Date Taking? Authorizing Provider  albuterol (PROVENTIL HFA;VENTOLIN HFA) 108 (90 Base) MCG/ACT inhaler Inhale 2 puffs into the lungs every 6 (six) hours as needed for wheezing or shortness of breath.   Yes [provider]  amLODipine (NORVASC) 5 MG tablet Take 5 mg by mouth daily.   Yes [provider]  aspirin EC 325 MG tablet Take 325 mg by mouth daily.   Yes [provider]  esomeprazole (NEXIUM) 40 MG capsule Take 40 mg by mouth daily.   Yes [provider]  Fluticasone-Salmeterol (ADVAIR) 100-50 MCG/DOSE AEPB Inhale 1 puff into the lungs 2 (two) times daily. 01/16/17  Yes Shaune Pollack, MD  gabapentin (NEURONTIN) 300 MG capsule Take 1,200 mg by mouth 3 (three) times daily.    Yes [provider]  imipramine (TOFRANIL) 50 MG tablet Take 200 mg by mouth at bedtime.   Yes [provider]  metoprolol succinate (TOPROL XL) 25 MG 24 hr tablet Take 25 mg by mouth daily.   Yes [provider]  risperiDONE (RISPERDAL) 2 MG tablet Take 4 mg by mouth at bedtime.   Yes [provider]  diazepam (VALIUM) 10 MG tablet Take 1 tablet (10 mg total) by mouth 3 (three) times daily.  09/07/16   Adrian SaranMody, Sital, MD  ipratropium-albuterol (DUONEB) 0.5-2.5 (3) MG/3ML SOLN Take 3 mLs by nebulization every 6 (six) hours. 08/19/16   Enid BaasKalisetti, Radhika, MD    Allergies Patient has no known allergies.  Family History  Problem Relation Age of Onset  . CAD Mother        deceased 8758  . Stroke Father        deceased 8573  . Heart attack Brother        deceased 8958    Social History Social History  Substance Use  Topics  . Smoking status: Current Every Day Smoker    Packs/day: 1.00    Years: 40.00    Types: Cigarettes  . Smokeless tobacco: Never Used  . Alcohol use 7.2 - 14.4 oz/week    12 - 24 Cans of beer per week     Comment: occ    Review of Systems Level V exemption history Limited by the patient's altered mental status  ____________________________________________   PHYSICAL EXAM:  VITAL SIGNS: ED Triage Vitals  Enc Vitals Group     BP      Pulse      Resp      Temp      Temp src      SpO2      Weight      Height      Head Circumference      Peak Flow      Pain Score      Pain Loc      Pain Edu?      Excl. in GC?     Constitutional: Clearly confused pleasant cooperative speaks in full clear sentences with no respiratory distress Disheveled and malodorous wearing old hospital clothing Eyes: PERRL EOMI. Head: Atraumatic. Nose: No congestion/rhinnorhea. Mouth/Throat: No trismus Neck: No stridor.   Cardiovascular: Tachycardic rate, regular rhythm. Grossly normal heart sounds.  Good peripheral circulation. Respiratory: Normal respiratory effort.  No retractions. Lungs CTAB and moving good air Gastrointestinal: Soft nondistended nontender Musculoskeletal: No lower extremity edema   Neurologic:  No gross focal neurologic deficits are appreciated. Skin:  Skin is warm, dry and intact. No rash noted. Psychiatric: Bizarre affect.    ______________________________________   LABS (all labs ordered are listed, but only abnormal results are displayed)  Labs Reviewed  BLOOD GAS, VENOUS - Abnormal; Notable for the following:       Result Value   pH, Ven 7.47 (*)    pCO2, Ven 32 (*)    pO2, Ven <31.0 (*)    All other components within normal limits  BASIC METABOLIC PANEL - Abnormal; Notable for the following:    Sodium 122 (*)    Potassium 3.1 (*)    Chloride 91 (*)    Glucose, Bld 106 (*)    All other components within normal limits  HEPATIC FUNCTION PANEL -  Abnormal; Notable for the following:    ALT 12 (*)    All other components within normal limits  CBC WITH DIFFERENTIAL/PLATELET - Abnormal; Notable for the following:    WBC 11.4 (*)    Hemoglobin 12.6 (*)    HCT 35.9 (*)    MCV 78.7 (*)    RDW 15.7 (*)    Platelets 470 (*)    Neutro Abs 7.3 (*)    Monocytes Absolute 1.2 (*)    All other components within normal limits  CK - Abnormal; Notable for the following:    Total CK 36 (*)  All other components within normal limits  URINALYSIS, COMPLETE (UACMP) WITH MICROSCOPIC - Abnormal; Notable for the following:    Color, Urine COLORLESS (*)    APPearance CLEAR (*)    Specific Gravity, Urine 1.001 (*)    Ketones, ur 5 (*)    All other components within normal limits  OSMOLALITY, URINE - Abnormal; Notable for the following:    Osmolality, Ur 55 (*)    All other components within normal limits  BASIC METABOLIC PANEL - Abnormal; Notable for the following:    Sodium 129 (*)    Chloride 96 (*)    Calcium 8.4 (*)    All other components within normal limits  BASIC METABOLIC PANEL - Abnormal; Notable for the following:    Sodium 130 (*)    Potassium 3.3 (*)    Chloride 97 (*)    Calcium 8.4 (*)    All other components within normal limits  TROPONIN I  BRAIN NATRIURETIC PEPTIDE  SODIUM, URINE, RANDOM  CREATININE, URINE, RANDOM    Significant hyponatremia  EKG  ED ECG REPORT I, Merrily Brittle, the attending physician, personally viewed and interpreted this ECG.  Date: 01/24/2017 Rate: 93 Rhythm: normal sinus rhythm QRS Axis: normal Intervals: normal ST/T Wave abnormalities: normal Narrative Interpretation: nonspecific intraventricular conduction delay otherwise unremarkable  ____________________________________________  RADIOLOGY  Chest x-ray with no acute disease ____________________________________________   PROCEDURES  Procedure(s) performed: no  Procedures  Critical Care performed: yes  CRITICAL  CARE Performed by: Merrily Brittle   Total critical care time: 35 minutes  Critical care time was exclusive of separately billable procedures and treating other patients.  Critical care was necessary to treat or prevent imminent or life-threatening deterioration.  Critical care was time spent personally by me on the following activities: development of treatment plan with patient and/or surrogate as well as nursing, discussions with consultants, evaluation of patient's response to treatment, examination of patient, obtaining history from patient or surrogate, ordering and performing treatments and interventions, ordering and review of laboratory studies, ordering and review of radiographic studies, pulse oximetry and re-evaluation of patient's condition.   Observation: no ____________________________________________   INITIAL IMPRESSION / ASSESSMENT AND PLAN / ED COURSE  Pertinent labs & imaging results that were available during my care of the patient were reviewed by me and considered in my medical decision making (see chart for details).  The patient arrives confused and disheveled and although he reports subjective shortness of breath his lungs are clear and he is not working to breathe and he is saturating 99% on room air. He insists that he needs 4-1/2 L of oxygen. On chart review this is his third visit in the past month and on the previous 2 visits he had psychogenic polydipsia causing profound hyponatremia. At this point he requires fluid restriction and labs are pending.  Once again the patient's sodium is down to 122. I filed an Adult Management consultant report was social work and he will require inpatient admission for fluid restriction and further evaluation.      ____________________________________________   FINAL CLINICAL IMPRESSION(S) / ED DIAGNOSES  Final diagnoses:  Hyponatremia  Psychogenic polydipsia      NEW MEDICATIONS STARTED DURING THIS  VISIT:  Discharge Medication List as of 01/25/2017 10:41 AM       Note:  This document was prepared using Dragon voice recognition software and may include unintentional dictation errors.     Merrily Brittle, MD 01/25/17 1529
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
## 2017-01-25 NOTE — Progress Notes (Signed)
Patient discharged to home as ordered, [facility] Site clean dry and intact. Follow up appointments given as ordered. Patient calling to see if he can get a ride to the house. Patient instructed to remain on 1200ml fluid restriction per day. Patient is alert ambulates without assistance. No acute distress noted.

## 2017-01-25 NOTE — Discharge Instructions (Signed)
Pt advised 1200 cc of total fluids a day

## 2017-01-25 NOTE — Clinical Social Work Note (Signed)
Pt is ready for discharge today and will return home. RNCM following for discharge needs. [facility] will provide transportation at 1 PM. CSW spoke to ED CSW, waiting to hear from APS and then will provide update. CSW is signing off as no further needs identified.   Dede QuerySarah Khanh Tanori, MSW, LCSW  Clinical Social Worker  [phone]
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
## 2017-01-25 NOTE — Care Management (Signed)
RNCM contacted [facility] [phone] and Medicaid transportation approval [id number]. [facility] will pick up at 1PM today. Patient agrees- requesting "scrubs to wear home". Message left for nurse through unit clerk. CSW updated. No further RNCM needs.

## 2017-01-25 NOTE — Progress Notes (Signed)
[facility]  ROUNDING NOTE   Subjective:   Patient asking for coffee. Already drank 3 cups.   Objective:  Vital signs in last 24 hours:  Temp:  [97.8 F (36.6 C)-98.4 F (36.9 C)] 98.4 F (36.9 C) (06/12 0448) Pulse Rate:  [76-103] 76 (06/12 0448) Resp:  [16-29] 16 (06/12 0448) BP: (81-118)/(49-66) 81/49 (06/12 0448) SpO2:  [96 %-100 %] 96 % (06/12 0811)  Weight change:  Filed Weights   01/24/17 1104  Weight: 77.1 kg (170 lb)    Intake/Output: I/O last 3 completed shifts: In: 188.4 [I.V.:188.4] Out: 2820 [Urine:2820]   Intake/Output this shift:  No intake/output data recorded.  Physical Exam: General: NAD, laying in bed  Head: Normocephalic, atraumatic. Moist oral mucosal membranes  Eyes: Anicteric, PERRL  Neck: Supple, trachea midline  Lungs:  Bilateral wheezes  Heart: Regular rate and rhythm  Abdomen:  Soft, nontender,   Extremities: no peripheral edema.  Neurologic: Nonfocal, moving all four extremities  Skin: No lesions       Basic Metabolic Panel:  Recent Labs Lab 01/24/17 1044 01/24/17 1820 01/24/17 2147  NA 122* 129* 130*  K 3.1* 3.5 3.3*  CL 91* 96* 97*  CO2 22 24 24   GLUCOSE 106* 86 97  BUN 6 9 11   CREATININE 0.62 0.75 0.68  CALCIUM 8.9 8.4* 8.4*    Liver Function Tests:  Recent Labs Lab 01/24/17 1044  AST 19  ALT 12*  ALKPHOS 94  BILITOT 1.1  PROT 6.9  ALBUMIN 4.1   No results for input(s): LIPASE, AMYLASE in the last 168 hours. No results for input(s): AMMONIA in the last 168 hours.  CBC:  Recent Labs Lab 01/24/17 1044  WBC 11.4*  NEUTROABS 7.3*  HGB 12.6*  HCT 35.9*  MCV 78.7*  PLT 470*    Cardiac Enzymes:  Recent Labs Lab 01/24/17 1044  CKTOTAL 36*  TROPONINI <0.03    BNP: Invalid input(s): POCBNP  CBG: No results for input(s): GLUCAP in the last 168 hours.  Microbiology: Results for orders placed or performed during the hospital encounter of 09/05/16  Culture, blood (Routine x 2)      Status: None   Collection Time: 09/05/16  1:12 PM  Result Value Ref Range Status   Specimen Description BLOOD  RIGHT HAND  Final   Special Requests   Final    BOTTLES DRAWN AEROBIC AND ANAEROBIC  AER 7 ML ANA 3 ML   Culture NO GROWTH 5 DAYS  Final   Report Status 09/10/2016 FINAL  Final  Culture, blood (Routine x 2)     Status: None   Collection Time: 09/05/16  1:12 PM  Result Value Ref Range Status   Specimen Description BLOOD  RIGHT AC  Final   Special Requests   Final    BOTTLES DRAWN AEROBIC AND ANAEROBIC  AER 8 ML ANA 9 ML   Culture NO GROWTH 5 DAYS  Final   Report Status 09/10/2016 FINAL  Final  Urine culture     Status: None   Collection Time: 09/05/16  1:12 PM  Result Value Ref Range Status   Specimen Description URINE, RANDOM  Final   Special Requests NONE  Final   Culture   Final    NO GROWTH Performed at [facility], [address] [place], [place] [address]    Report Status 09/06/2016 FINAL  Final  C difficile quick scan w PCR reflex     Status: None   Collection Time: 09/06/16  11:24 PM  Result Value Ref Range Status   C Diff antigen NEGATIVE NEGATIVE Final   C Diff toxin NEGATIVE NEGATIVE Final   C Diff interpretation No C. difficile detected.  Final    Coagulation Studies: No results for input(s): LABPROT, INR in the last 72 hours.  Urinalysis:  Recent Labs  01/24/17 1044  COLORURINE COLORLESS*  LABSPEC 1.001*  PHURINE 7.0  GLUCOSEU NEGATIVE  HGBUR NEGATIVE  BILIRUBINUR NEGATIVE  KETONESUR 5*  PROTEINUR NEGATIVE  NITRITE NEGATIVE  LEUKOCYTESUR NEGATIVE      Imaging: Dg Chest 2 View  Result Date: 01/24/2017 CLINICAL DATA:  Shortness of breath and mid chest tightness since this morning. History of CHF, coronary artery disease with stent placement, COPD, current smoker. EXAM: CHEST  2 VIEW COMPARISON:  Chest x-ray of January 14, 2017 FINDINGS: The lungs are hyperinflated with hemidiaphragm flattening. The heart and pulmonary vascularity are  normal. The mediastinum is normal in width. There is no pleural effusion. There is calcification in the wall of the aortic arch. There are old deformities of the lateral aspects of the left fifth and sixth ribs. IMPRESSION: COPD. No pneumonia, CHF, nor other acute cardiopulmonary abnormality. Thoracic aortic atherosclerosis. Electronically Signed   By: David  [place]aziland M.D.   On: 01/24/2017 11:35     Medications:    . amLODipine  5 mg Oral Daily  . aspirin EC  325 mg Oral Daily  . enoxaparin (LOVENOX) injection  40 mg Subcutaneous Q24H  . gabapentin  1,200 mg Oral TID  . imipramine  200 mg Oral QHS  . ipratropium-albuterol  3 mL Nebulization Q6H  . metoprolol succinate  25 mg Oral Daily  . mometasone-formoterol  2 puff Inhalation BID  . pantoprazole  40 mg Oral Daily  . risperiDONE  4 mg Oral QHS   acetaminophen **OR** acetaminophen, albuterol, diazepam, ondansetron **OR** ondansetron (ZOFRAN) IV  Assessment/ Plan:  [name] is a 61 y.o.  male [name] is a 61 y.o. white male with COPD/tobacco, hypertension, GERD, coronary artery disease, diabetes mellitus type II, congestive heart failure, anxiety, who was admitted to [facility] on 01/24/2017 for Hyponatremia [E87.1] Psychogenic polydipsia [R63.1, F54]   1. Hyponatremia 2. Psychogenic polydipsia 3. Hypokalemia 4. Hypertension  Plan: - Fluid restriction 1270mL/day - Appreciate psych input.   LOS: 1 Dima Mini 6/12/201811:24 AM

## 2017-01-25 NOTE — Discharge Summary (Signed)
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 CHF (congestive heart failure) (HCC)   . COPD (chronic obstructive pulmonary disease) (HCC)   . Diabetes mellitus without complication (HCC)   . Hypertension   . MI (myocardial infarction) [facility](HCC) 2006    HOSPITAL COURSE:   Kristopher Powell  is a 61 y.o. male with a known history of Depression, COPD with ongoing tobacco abuse on chronic home oxygen (patient reports 4-1/2 L) comes to the emergency room with increasing shortness of breath although his sats are more than 92% on room air. Patient was found in filthy living condition at home. He has history of drinking about 2 2-1/2 gallons of water on a daily basis.  1. 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 He has history of depression. Patient denies any alcohol abuse. -recieved IV normal saline with potassium -Follow sodium every 4 hours 122---129--130 -Nephrology consultation appreciated -consulted psychiatry for history of depression and repeated admission for psychogenic polydipsia --seen by Dr clapacs--recommends cont present meds since he has been on it chronically and f/u with DR ward to see if he can adjust the dosage as out pt   2. COPD with ongoing tobacco abuse -Continue nebs and inhalers -Patient reports using 4-1/2 L of oxygen. His sats are more than 96-97%% on room air he currently is not wheezing -We will use oxygen as needed  3. Hypertension  continue home meds  4. Hypokalemia replace with IV KCl  5. Social worker for discharge planning.  [facility] has been initiated in the ER [facility] ([facility]ysch RN), SW and aide  6. DVT prophylaxis subcutaneous Lovenox  attempted several times to call son Reuel BoomDaniel but unable to do get hold of him. D/c home  CONSULTS OBTAINED:  Treatment Team:  Clapacs, Jackquline DenmarkJohn T, MD Lamont DowdyKolluru, Sarath, MD  DRUG ALLERGIES:  No Known Allergies  DISCHARGE MEDICATIONS:   Current Discharge Medication List    CONTINUE these medications which have NOT CHANGED   Details  albuterol (PROVENTIL HFA;VENTOLIN HFA) 108 (90 Base) MCG/ACT inhaler Inhale 2 puffs into the lungs every 6 (six) hours as needed for wheezing or shortness of breath.    amLODipine (NORVASC) 5 MG tablet Take 5 mg by mouth daily.    aspirin EC 325 MG tablet Take 325 mg by mouth daily.    esomeprazole (NEXIUM) 40 MG capsule Take 40 mg by mouth daily.    Fluticasone-Salmeterol (ADVAIR) 100-50 MCG/DOSE AEPB Inhale 1 puff into the lungs 2 (two) times daily. Qty: 60 each, Refills: 2    gabapentin (NEURONTIN) 300 MG capsule Take 1,200 mg by mouth 3 (three) times daily.     imipramine (TOFRANIL) 50 MG tablet Take 200 mg by mouth at bedtime.    metoprolol succinate (TOPROL XL) 25 MG 24 hr tablet Take 25 mg by mouth daily.    risperiDONE (RISPERDAL) 2 MG tablet Take 4 mg by mouth at bedtime.    diazepam (VALIUM) 10 MG tablet Take 1 tablet (10 mg total) by mouth 3 (  three) times daily. Qty: 90 tablet, Refills: 0    ipratropium-albuterol (DUONEB) 0.5-2.5 (3) MG/3ML SOLN Take 3 mLs by nebulization every 6 (six) hours. Qty: 360 mL, Refills: 0        If you experience worsening of your admission symptoms, develop shortness of breath, life threatening emergency, suicidal or homicidal thoughts you must seek medical attention immediately by calling 911 or calling your MD immediately  if symptoms less severe.  You Must read complete  instructions/literature along with all the possible adverse reactions/side effects for all the Medicines you take and that have been prescribed to you. Take any new Medicines after you have completely understood and accept all the possible adverse reactions/side effects.   Please note  You were cared for by a hospitalist during your hospital stay. If you have any questions about your discharge medications or the care you received while you were in the hospital after you are discharged, you can call the unit and asked to speak with the hospitalist on call if the hospitalist that took care of you is not available. Once you are discharged, your primary care physician will handle any further medical issues. Please note that NO REFILLS for any discharge medications will be authorized once you are discharged, as it is imperative that you return to your primary care physician (or establish a relationship with a primary care physician if you do not have one) for your aftercare needs so that they can reassess your need for medications and monitor your lab values. Today   SUBJECTIVE   Doing well.  Wants more fluids while here  VITAL SIGNS:  Blood pressure (!) 81/49, pulse 76, temperature 98.4 F (36.9 C), temperature source Oral, resp. rate 16, height 6' (1.829 m), weight 77.1 kg (170 lb), SpO2 96 %.  I/O:   Intake/Output Summary (Last 24 hours) at 01/25/17 1016 Last data filed at 01/25/17 1610  Gross per 24 hour  Intake            188.4 ml  Output             2820 ml  Net          -2631.6 ml    PHYSICAL EXAMINATION:  GENERAL:  61 y.o.-year-old patient lying in the bed with no acute distress.  EYES: Pupils equal, round, reactive to light and accommodation. No scleral icterus. Extraocular muscles intact.  HEENT: Head atraumatic, normocephalic. Oropharynx and nasopharynx clear.  NECK:  Supple, no jugular venous distention. No thyroid enlargement, no tenderness.  LUNGS: Normal breath sounds  bilaterally, no wheezing, rales,rhonchi or crepitation. No use of accessory muscles of respiration.  CARDIOVASCULAR: S1, S2 normal. No murmurs, rubs, or gallops.  ABDOMEN: Soft, non-tender, non-distended. Bowel sounds present. No organomegaly or mass.  EXTREMITIES: No pedal edema, cyanosis, or clubbing.  NEUROLOGIC: Cranial nerves II through XII are intact. Muscle strength 5/5 in all extremities. Sensation intact. Gait not checked.  PSYCHIATRIC: The patient is alert and oriented x 3.  SKIN: No obvious rash, lesion, or ulcer.   DATA REVIEW:   CBC   Recent Labs Lab 01/24/17 1044  WBC 11.4*  HGB 12.6*  HCT 35.9*  PLT 470*    Chemistries   Recent Labs Lab 01/24/17 1044  01/24/17 2147  NA 122*  < > 130*  K 3.1*  < > 3.3*  CL 91*  < > 97*  CO2 22  < > 24  GLUCOSE 106*  < > 97  BUN 6  < >  11  CREATININE 0.62  < > 0.68  CALCIUM 8.9  < > 8.4*  AST 19  --   --   ALT 12*  --   --   ALKPHOS 94  --   --   BILITOT 1.1  --   --   < > = values in this interval not displayed.  Microbiology Results   No results found for this or any previous visit (from the past 240 hour(s)).  RADIOLOGY:  Dg Chest 2 View  Result Date: 01/24/2017 CLINICAL DATA:  Shortness of breath and mid chest tightness since this morning. History of CHF, coronary artery disease with stent placement, COPD, current smoker. EXAM: CHEST  2 VIEW COMPARISON:  Chest x-ray of January 14, 2017 FINDINGS: The lungs are hyperinflated with hemidiaphragm flattening. The heart and pulmonary vascularity are normal. The mediastinum is normal in width. There is no pleural effusion. There is calcification in the wall of the aortic arch. There are old deformities of the lateral aspects of the left fifth and sixth ribs. IMPRESSION: COPD. No pneumonia, CHF, nor other acute cardiopulmonary abnormality. Thoracic aortic atherosclerosis. Electronically Signed   By: David  Swaziland M.D.   On: 01/24/2017 11:35     Management plans discussed with  the patient, family and they are in agreement.  CODE STATUS:     Code Status Orders        Start     Ordered   01/24/17 1357  Full code  Continuous     01/24/17 1356    Code Status History    Date Active Date Inactive Code Status Order ID Comments User Context   01/15/2017 12:37 AM 01/16/2017  4:17 PM Full Code [id number]  Oralia Manis, MD Inpatient   01/07/2017  7:48 PM 01/09/2017  6:41 PM Full Code [id number]  Altamese Dilling, MD ED   09/05/2016  6:08 PM 09/07/2016  7:37 PM Full Code [id number]  Marguarite Arbour, MD Inpatient   08/14/2016 10:48 AM 08/19/2016  7:57 PM Full Code [id number]  Arnaldo Natal, MD Inpatient   08/06/2016 11:40 PM 08/07/2016  4:34 PM Full Code [id number]  Tonye Royalty, DO Inpatient   05/23/2015 10:14 PM 05/25/2015  2:59 PM Full Code [id number]  Altamese Dilling, MD Inpatient      TOTAL TIME TAKING CARE OF THIS PATIENT: *40* minutes.    Alarik Radu M.D on 01/25/2017 at 10:16 AM  Between 7am to 6pm - Pager - [phone] After 6pm go to [email or web address] - Social research officer, government  [facility]  Office  [phone]  CC: Primary care physician; Candie Chroman, DO

## 2017-01-25 NOTE — H&P (Signed)
RNCM attempted to contact patient's son Reuel BoomDaniel at numbers listed however they are not correct. I spoke with a lady that did not state her name but states that "these numbers do not belong to [employer] or Reuel BoomDaniel anymore but you can reach them at [phone]". I attempted to contact son Reuel BoomDaniel at this number but was not able to leave a message. Follow up appointment made for patient with PCP Dr. Pamala DuffelBarzin on 6/28 at [phone] which son will need to confirm with RNCM. Patient is on chronic O2 at home per H & P. He meets criteria for [facility] home health services also. RNCM will continue to follow.

## 2017-01-26 LAB — BLOOD GAS, VENOUS
Acid-Base Excess: 0.3 mmol/L (ref 0.0–2.0)
Bicarbonate: 23.3 mmol/L (ref 20.0–28.0)
PATIENT TEMPERATURE: 37
PCO2 VEN: 32 mmHg — AB (ref 44.0–60.0)
PH VEN: 7.47 — AB (ref 7.250–7.430)

## 2017-01-28 ENCOUNTER — Inpatient Hospital Stay
Admission: EM | Admit: 2017-01-28 | Discharge: 2017-02-11 | DRG: 190 | Disposition: A | Discharge status: Skilled Nursing Facility | Payer: Medicaid Other | Attending: Internal Medicine | Admitting: Internal Medicine

## 2017-01-28 ENCOUNTER — Emergency Department: Payer: Medicaid Other

## 2017-01-28 ENCOUNTER — Encounter: Payer: Self-pay | Admitting: Internal Medicine

## 2017-01-28 DIAGNOSIS — F329 Major depressive disorder, single episode, unspecified: Secondary | ICD-10-CM | POA: Diagnosis present

## 2017-01-28 DIAGNOSIS — K219 Gastro-esophageal reflux disease without esophagitis: Secondary | ICD-10-CM | POA: Diagnosis present

## 2017-01-28 DIAGNOSIS — Z7982 Long term (current) use of aspirin: Secondary | ICD-10-CM | POA: Diagnosis not present

## 2017-01-28 DIAGNOSIS — R0602 Shortness of breath: Secondary | ICD-10-CM | POA: Diagnosis not present

## 2017-01-28 DIAGNOSIS — A4102 Sepsis due to Methicillin resistant Staphylococcus aureus: Secondary | ICD-10-CM | POA: Diagnosis present

## 2017-01-28 DIAGNOSIS — R7881 Bacteremia: Secondary | ICD-10-CM | POA: Diagnosis not present

## 2017-01-28 DIAGNOSIS — Z955 Presence of coronary angioplasty implant and graft: Secondary | ICD-10-CM | POA: Diagnosis not present

## 2017-01-28 DIAGNOSIS — I110 Hypertensive heart disease with heart failure: Secondary | ICD-10-CM | POA: Diagnosis present

## 2017-01-28 DIAGNOSIS — R05 Cough: Secondary | ICD-10-CM

## 2017-01-28 DIAGNOSIS — E1165 Type 2 diabetes mellitus with hyperglycemia: Secondary | ICD-10-CM | POA: Diagnosis present

## 2017-01-28 DIAGNOSIS — J441 Chronic obstructive pulmonary disease with (acute) exacerbation: Secondary | ICD-10-CM | POA: Diagnosis present

## 2017-01-28 DIAGNOSIS — Z7952 Long term (current) use of systemic steroids: Secondary | ICD-10-CM

## 2017-01-28 DIAGNOSIS — Z792 Long term (current) use of antibiotics: Secondary | ICD-10-CM | POA: Diagnosis not present

## 2017-01-28 DIAGNOSIS — R339 Retention of urine, unspecified: Secondary | ICD-10-CM | POA: Diagnosis present

## 2017-01-28 DIAGNOSIS — J690 Pneumonitis due to inhalation of food and vomit: Secondary | ICD-10-CM | POA: Diagnosis present

## 2017-01-28 DIAGNOSIS — F17210 Nicotine dependence, cigarettes, uncomplicated: Secondary | ICD-10-CM | POA: Diagnosis present

## 2017-01-28 DIAGNOSIS — Z79899 Other long term (current) drug therapy: Secondary | ICD-10-CM

## 2017-01-28 DIAGNOSIS — E871 Hypo-osmolality and hyponatremia: Secondary | ICD-10-CM | POA: Diagnosis present

## 2017-01-28 DIAGNOSIS — J189 Pneumonia, unspecified organism: Secondary | ICD-10-CM | POA: Diagnosis not present

## 2017-01-28 DIAGNOSIS — Z7189 Other specified counseling: Secondary | ICD-10-CM

## 2017-01-28 DIAGNOSIS — J449 Chronic obstructive pulmonary disease, unspecified: Secondary | ICD-10-CM | POA: Diagnosis not present

## 2017-01-28 DIAGNOSIS — D638 Anemia in other chronic diseases classified elsewhere: Secondary | ICD-10-CM | POA: Diagnosis present

## 2017-01-28 DIAGNOSIS — R059 Cough, unspecified: Secondary | ICD-10-CM

## 2017-01-28 DIAGNOSIS — J440 Chronic obstructive pulmonary disease with acute lower respiratory infection: Secondary | ICD-10-CM | POA: Diagnosis present

## 2017-01-28 DIAGNOSIS — Z515 Encounter for palliative care: Secondary | ICD-10-CM | POA: Diagnosis present

## 2017-01-28 DIAGNOSIS — I252 Old myocardial infarction: Secondary | ICD-10-CM | POA: Diagnosis not present

## 2017-01-28 DIAGNOSIS — J9690 Respiratory failure, unspecified, unspecified whether with hypoxia or hypercapnia: Secondary | ICD-10-CM

## 2017-01-28 DIAGNOSIS — G629 Polyneuropathy, unspecified: Secondary | ICD-10-CM | POA: Diagnosis present

## 2017-01-28 DIAGNOSIS — J9600 Acute respiratory failure, unspecified whether with hypoxia or hypercapnia: Secondary | ICD-10-CM

## 2017-01-28 DIAGNOSIS — J9621 Acute and chronic respiratory failure with hypoxia: Secondary | ICD-10-CM | POA: Diagnosis not present

## 2017-01-28 DIAGNOSIS — R531 Weakness: Secondary | ICD-10-CM

## 2017-01-28 DIAGNOSIS — J15212 Pneumonia due to Methicillin resistant Staphylococcus aureus: Secondary | ICD-10-CM | POA: Diagnosis not present

## 2017-01-28 DIAGNOSIS — I509 Heart failure, unspecified: Secondary | ICD-10-CM | POA: Diagnosis present

## 2017-01-28 LAB — COMPREHENSIVE METABOLIC PANEL
ALT: 11 U/L — AB (ref 17–63)
AST: 26 U/L (ref 15–41)
Albumin: 4.2 g/dL (ref 3.5–5.0)
Alkaline Phosphatase: 108 U/L (ref 38–126)
Anion gap: 9 (ref 5–15)
BUN: 9 mg/dL (ref 6–20)
CALCIUM: 9.1 mg/dL (ref 8.9–10.3)
CHLORIDE: 95 mmol/L — AB (ref 101–111)
CO2: 22 mmol/L (ref 22–32)
CREATININE: 0.66 mg/dL (ref 0.61–1.24)
Glucose, Bld: 129 mg/dL — ABNORMAL HIGH (ref 65–99)
Potassium: 3.4 mmol/L — ABNORMAL LOW (ref 3.5–5.1)
Sodium: 126 mmol/L — ABNORMAL LOW (ref 135–145)
TOTAL PROTEIN: 7.2 g/dL (ref 6.5–8.1)
Total Bilirubin: 0.9 mg/dL (ref 0.3–1.2)

## 2017-01-28 LAB — CBC
HCT: 37.9 % — ABNORMAL LOW (ref 40.0–52.0)
Hemoglobin: 13.3 g/dL (ref 13.0–18.0)
MCH: 28.3 pg (ref 26.0–34.0)
MCHC: 35.3 g/dL (ref 32.0–36.0)
MCV: 80.2 fL (ref 80.0–100.0)
PLATELETS: 416 10*3/uL (ref 150–440)
RBC: 4.72 MIL/uL (ref 4.40–5.90)
RDW: 16.1 % — AB (ref 11.5–14.5)
WBC: 13 10*3/uL — ABNORMAL HIGH (ref 3.8–10.6)

## 2017-01-28 LAB — TROPONIN I

## 2017-01-28 LAB — GLUCOSE, CAPILLARY
GLUCOSE-CAPILLARY: 287 mg/dL — AB (ref 65–99)
Glucose-Capillary: 157 mg/dL — ABNORMAL HIGH (ref 65–99)

## 2017-01-28 MED ORDER — ASPIRIN EC 325 MG PO TBEC
325.0000 mg | DELAYED_RELEASE_TABLET | Freq: Every day | ORAL | Status: DC
  Administered 2017-01-28 – 2017-02-11 (×14): 325 mg via ORAL
  Filled 2017-01-28 (×14): qty 1

## 2017-01-28 MED ORDER — METOPROLOL SUCCINATE ER 25 MG PO TB24
25.0000 mg | ORAL_TABLET | Freq: Every day | ORAL | Status: DC
  Administered 2017-01-28 – 2017-02-11 (×14): 25 mg via ORAL
  Filled 2017-01-28 (×14): qty 1

## 2017-01-28 MED ORDER — ONDANSETRON HCL 4 MG/2ML IJ SOLN: 4.0000 mg | Freq: Four times a day (QID) | INTRAMUSCULAR | Status: DC | PRN

## 2017-01-28 MED ORDER — ENOXAPARIN SODIUM 40 MG/0.4ML ~~LOC~~ SOLN
40.0000 mg | SUBCUTANEOUS | Status: DC
  Administered 2017-01-28 – 2017-02-10 (×13): 40 mg via SUBCUTANEOUS
  Filled 2017-01-28 (×14): qty 0.4

## 2017-01-28 MED ORDER — NICOTINE 21 MG/24HR TD PT24
21.0000 mg | MEDICATED_PATCH | Freq: Once | TRANSDERMAL | Status: AC
  Administered 2017-01-28: 21 mg via TRANSDERMAL
  Filled 2017-01-28: qty 1

## 2017-01-28 MED ORDER — ACETAMINOPHEN 325 MG PO TABS
650.0000 mg | ORAL_TABLET | Freq: Four times a day (QID) | ORAL | Status: DC | PRN
  Administered 2017-01-31 – 2017-02-10 (×8): 650 mg via ORAL
  Filled 2017-01-28 (×9): qty 2

## 2017-01-28 MED ORDER — PANTOPRAZOLE SODIUM 40 MG PO TBEC
40.0000 mg | DELAYED_RELEASE_TABLET | Freq: Every day | ORAL | Status: DC
  Administered 2017-01-28 – 2017-02-11 (×14): 40 mg via ORAL
  Filled 2017-01-28 (×14): qty 1

## 2017-01-28 MED ORDER — AMLODIPINE BESYLATE 5 MG PO TABS
5.0000 mg | ORAL_TABLET | Freq: Every day | ORAL | Status: DC
  Administered 2017-01-28: 18:00:00 5 mg via ORAL
  Filled 2017-01-28: qty 1

## 2017-01-28 MED ORDER — ALBUTEROL SULFATE (2.5 MG/3ML) 0.083% IN NEBU
2.5000 mg | INHALATION_SOLUTION | RESPIRATORY_TRACT | Status: DC | PRN
  Administered 2017-02-01: 2.5 mg via RESPIRATORY_TRACT
  Filled 2017-01-28: qty 3

## 2017-01-28 MED ORDER — IMIPRAMINE HCL 50 MG PO TABS
200.0000 mg | ORAL_TABLET | Freq: Every day | ORAL | Status: DC
  Administered 2017-01-28 – 2017-02-10 (×13): 200 mg via ORAL
  Filled 2017-01-28 (×5): qty 4
  Filled 2017-01-28: qty 8
  Filled 2017-01-28 (×10): qty 4

## 2017-01-28 MED ORDER — IPRATROPIUM-ALBUTEROL 0.5-2.5 (3) MG/3ML IN SOLN
3.0000 mL | RESPIRATORY_TRACT | Status: DC
  Administered 2017-01-28 – 2017-01-30 (×10): 3 mL via RESPIRATORY_TRACT
  Filled 2017-01-28 (×11): qty 3

## 2017-01-28 MED ORDER — INSULIN ASPART 100 UNIT/ML ~~LOC~~ SOLN
0.0000 [IU] | Freq: Every day | SUBCUTANEOUS | Status: DC
  Administered 2017-02-07: 2 [IU] via SUBCUTANEOUS
  Administered 2017-02-10: 3 [IU] via SUBCUTANEOUS
  Filled 2017-01-28 (×2): qty 1

## 2017-01-28 MED ORDER — GABAPENTIN 400 MG PO CAPS
1200.0000 mg | ORAL_CAPSULE | Freq: Three times a day (TID) | ORAL | Status: DC
  Administered 2017-01-28 – 2017-02-11 (×38): 1200 mg via ORAL
  Filled 2017-01-28: qty 3
  Filled 2017-01-28: qty 4
  Filled 2017-01-28 (×2): qty 3
  Filled 2017-01-28 (×2): qty 4
  Filled 2017-01-28 (×5): qty 3
  Filled 2017-01-28 (×2): qty 4
  Filled 2017-01-28 (×3): qty 3
  Filled 2017-01-28 (×2): qty 4
  Filled 2017-01-28 (×6): qty 3
  Filled 2017-01-28: qty 4
  Filled 2017-01-28: qty 3
  Filled 2017-01-28 (×2): qty 4
  Filled 2017-01-28 (×2): qty 3
  Filled 2017-01-28: qty 4
  Filled 2017-01-28 (×2): qty 3
  Filled 2017-01-28: qty 4
  Filled 2017-01-28 (×4): qty 3
  Filled 2017-01-28: qty 4

## 2017-01-28 MED ORDER — ACETAMINOPHEN 650 MG RE SUPP
650.0000 mg | Freq: Four times a day (QID) | RECTAL | Status: DC | PRN
  Administered 2017-02-01 – 2017-02-03 (×2): 650 mg via RECTAL
  Filled 2017-01-28 (×2): qty 1

## 2017-01-28 MED ORDER — MOMETASONE FURO-FORMOTEROL FUM 100-5 MCG/ACT IN AERO
2.0000 | INHALATION_SPRAY | Freq: Two times a day (BID) | RESPIRATORY_TRACT | Status: DC
  Administered 2017-01-28 – 2017-02-01 (×8): 2 via RESPIRATORY_TRACT
  Filled 2017-01-28: qty 8.8

## 2017-01-28 MED ORDER — SODIUM CHLORIDE 0.9 % IV SOLN
Freq: Once | INTRAVENOUS | Status: DC
  Administered 2017-01-28: 15:00:00 via INTRAVENOUS

## 2017-01-28 MED ORDER — ONDANSETRON HCL 4 MG PO TABS: 4.0000 mg | ORAL_TABLET | Freq: Four times a day (QID) | ORAL | Status: DC | PRN

## 2017-01-28 MED ORDER — POLYETHYLENE GLYCOL 3350 17 G PO PACK: 17.0000 g | PACK | Freq: Every day | ORAL | Status: DC | PRN

## 2017-01-28 MED ORDER — DIAZEPAM 5 MG PO TABS
5.0000 mg | ORAL_TABLET | Freq: Three times a day (TID) | ORAL | Status: DC | PRN
  Administered 2017-02-01 – 2017-02-10 (×10): 5 mg via ORAL
  Filled 2017-01-28 (×12): qty 1

## 2017-01-28 MED ORDER — METHYLPREDNISOLONE SODIUM SUCC 125 MG IJ SOLR
60.0000 mg | Freq: Two times a day (BID) | INTRAMUSCULAR | Status: DC
  Administered 2017-01-28 – 2017-01-30 (×4): 60 mg via INTRAVENOUS
  Filled 2017-01-28 (×4): qty 2

## 2017-01-28 MED ORDER — IPRATROPIUM-ALBUTEROL 0.5-2.5 (3) MG/3ML IN SOLN
3.0000 mL | Freq: Once | RESPIRATORY_TRACT | Status: AC
  Administered 2017-01-28: 3 mL via RESPIRATORY_TRACT
  Filled 2017-01-28: qty 3

## 2017-01-28 MED ORDER — RISPERIDONE 1 MG PO TABS
4.0000 mg | ORAL_TABLET | Freq: Every day | ORAL | Status: DC
  Administered 2017-01-28 – 2017-02-10 (×13): 4 mg via ORAL
  Filled 2017-01-28 (×8): qty 4
  Filled 2017-01-28: qty 8
  Filled 2017-01-28 (×6): qty 4
  Filled 2017-01-28: qty 8
  Filled 2017-01-28: qty 4

## 2017-01-28 MED ORDER — INSULIN ASPART 100 UNIT/ML ~~LOC~~ SOLN
0.0000 [IU] | Freq: Three times a day (TID) | SUBCUTANEOUS | Status: DC
  Administered 2017-01-28: 8 [IU] via SUBCUTANEOUS
  Administered 2017-01-29 (×2): 2 [IU] via SUBCUTANEOUS
  Administered 2017-01-30 (×2): 3 [IU] via SUBCUTANEOUS
  Administered 2017-01-30 – 2017-01-31 (×2): 2 [IU] via SUBCUTANEOUS
  Administered 2017-01-31: 12:00:00 3 [IU] via SUBCUTANEOUS
  Administered 2017-02-01: 12:00:00 8 [IU] via SUBCUTANEOUS
  Administered 2017-02-03: 2 [IU] via SUBCUTANEOUS
  Administered 2017-02-03 – 2017-02-04 (×2): 3 [IU] via SUBCUTANEOUS
  Administered 2017-02-04: 2 [IU] via SUBCUTANEOUS
  Administered 2017-02-06 (×2): 3 [IU] via SUBCUTANEOUS
  Administered 2017-02-07: 5 [IU] via SUBCUTANEOUS
  Administered 2017-02-07 (×2): 3 [IU] via SUBCUTANEOUS
  Administered 2017-02-08: 2 [IU] via SUBCUTANEOUS
  Administered 2017-02-08: 5 [IU] via SUBCUTANEOUS
  Administered 2017-02-09: 3 [IU] via SUBCUTANEOUS
  Administered 2017-02-09: 5 [IU] via SUBCUTANEOUS
  Administered 2017-02-09: 2 [IU] via SUBCUTANEOUS
  Administered 2017-02-10: 11 [IU] via SUBCUTANEOUS
  Administered 2017-02-10 (×2): 3 [IU] via SUBCUTANEOUS
  Administered 2017-02-11: 11 [IU] via SUBCUTANEOUS
  Administered 2017-02-11: 3 [IU] via SUBCUTANEOUS
  Filled 2017-01-28 (×6): qty 1
  Filled 2017-01-28: qty 2
  Filled 2017-01-28: qty 3
  Filled 2017-01-28: qty 8
  Filled 2017-01-28 (×2): qty 1
  Filled 2017-01-28: qty 3
  Filled 2017-01-28 (×4): qty 1
  Filled 2017-01-28: qty 2
  Filled 2017-01-28: qty 3
  Filled 2017-01-28: qty 1
  Filled 2017-01-28: qty 8
  Filled 2017-01-28 (×3): qty 1
  Filled 2017-01-28: qty 2
  Filled 2017-01-28 (×3): qty 1
  Filled 2017-01-28: qty 2

## 2017-01-28 NOTE — Care Management Note (Signed)
Case Management Note  Patient Details  Name: Emeterio ReeveHarmon L Carico MRN: [id number] Date of Birth: 09/16/1955  Subjective/Objective:        Found the phone number for Gordy CouncilmanKaren Townsend in the Medicaid portal, [phone] as a care team member, with a notation that last contact with the patient was on 01/26/17. I have left a VM on the line, which was had a message from BusbyStephanie. I have the person to call on my work phone, and will update when able to.            Action/Plan:   Expected Discharge Date:                  Expected Discharge Plan:     In-House Referral:     Discharge planning Services     Post Acute Care Choice:    Choice offered to:     DME Arranged:    DME Agency:     HH Arranged:    HH Agency:     Status of Service:     If discussed at [employer] Length of Stay Meetings, dates discussed:    Additional Comments:  Berna BueCheryl Karn Derk, RN 01/28/2017, 1:53 PM

## 2017-01-28 NOTE — H&P (Addendum)
[facility] - [place] [facility]   PATIENT NAME: Kristopher Powell    MR#:  [id number]  DATE OF BIRTH:  1956/08/05  DATE OF ADMISSION:  01/28/2017  PRIMARY CARE PHYSICIAN: Candie Chroman, DO   REQUESTING/REFERRING PHYSICIAN: Dr. Lenard Lance  CHIEF COMPLAINT:   Chief Complaint  Patient presents with  . Respiratory Distress    HISTORY OF PRESENT ILLNESS:  Kristopher Powell  is a 61 y.o. male with a known history of COPD, tobacco use, chronic respiratory failure on 4 L oxygen, neuropathy, GERD, depression presents to the emergency room due to worsening shortness of breath. His saturations were found to be 70% on room air. And 88% on 4 L oxygen. EMS gave him a dose of Solu-Medrol and nebulizer and brought to the emergency room. Here patient has received 2 additional nebulizer treatments. No significant improvement. During recent admission his saturations were 91% on room air. Patient also mentions that he does not have electricity at home. Does not have oxygen. Here chest x-ray shows nothing acute. Afebrile. Patient is being admitted for acute COPD exacerbation with acute on chronic respiratory failure. Patient continues to smoke. And has 6 admissions in the last 6 months. Lives alone.  He has chronic hyponatremia. During recent admissions due to worsening hyponatremia he has been placed on fluid restriction of 1500 ML by nephrology.  PAST MEDICAL HISTORY:   Past Medical History:  Diagnosis Date  . CHF (congestive heart failure) (HCC)   . COPD (chronic obstructive pulmonary disease) (HCC)   . Diabetes mellitus without complication (HCC)   . Hypertension   . MI (myocardial infarction) (HCC) 2006    PAST SURGICAL HISTORY:   Past Surgical History:  Procedure Laterality Date  . APPENDECTOMY    . CHOLECYSTECTOMY    . CORONARY ANGIOPLASTY WITH STENT PLACEMENT      SOCIAL HISTORY:   Social History  Substance Use Topics  . Smoking status: Current Every Day  Smoker    Packs/day: 1.00    Years: 40.00    Types: Cigarettes  . Smokeless tobacco: Never Used  . Alcohol use 7.2 - 14.4 oz/week    12 - 24 Cans of beer per week     Comment: occ    FAMILY HISTORY:   Family History  Problem Relation Age of Onset  . CAD Mother        deceased 21  . Stroke Father        deceased 69  . Heart attack Brother        deceased 61    DRUG ALLERGIES:  No Known Allergies  REVIEW OF SYSTEMS:   Review of Systems  Constitutional: Positive for malaise/fatigue. Negative for chills, fever and weight loss.  HENT: Negative for hearing loss and nosebleeds.   Eyes: Negative for blurred vision, double vision and pain.  Respiratory: Positive for cough, sputum production, shortness of breath and wheezing. Negative for hemoptysis.   Cardiovascular: Negative for chest pain, palpitations, orthopnea and leg swelling.  Gastrointestinal: Negative for abdominal pain, constipation, diarrhea, nausea and vomiting.  Genitourinary: Negative for dysuria and hematuria.  Musculoskeletal: Negative for back pain, falls and myalgias.  Skin: Negative for rash.  Neurological: Positive for weakness. Negative for dizziness, tremors, sensory change, speech change, focal weakness, seizures and headaches.  Endo/Heme/Allergies: Does not bruise/bleed easily.  Psychiatric/Behavioral: Positive for depression. Negative for memory loss. The patient is not nervous/anxious.     MEDICATIONS AT HOME:   Prior to Admission medications   Medication  Sig Start Date End Date Taking? Authorizing Provider  albuterol (PROVENTIL HFA;VENTOLIN HFA) 108 (90 Base) MCG/ACT inhaler Inhale 2 puffs into the lungs every 6 (six) hours as needed for wheezing or shortness of breath.   Yes [provider]  amLODipine (NORVASC) 5 MG tablet Take 5 mg by mouth daily.   Yes [provider]  aspirin EC 325 MG tablet Take 325 mg by mouth daily.   Yes [provider]  diazepam (VALIUM) 10 MG  tablet Take 1 tablet (10 mg total) by mouth 3 (three) times daily. 09/07/16  Yes Mody, Patricia PesaSital, MD  esomeprazole (NEXIUM) 40 MG capsule Take 40 mg by mouth daily.   Yes [provider]  Fluticasone-Salmeterol (ADVAIR) 100-50 MCG/DOSE AEPB Inhale 1 puff into the lungs 2 (two) times daily. 01/16/17  Yes Shaune Pollackhen, Qing, MD  gabapentin (NEURONTIN) 300 MG capsule Take 1,200 mg by mouth 3 (three) times daily.    Yes [provider]  imipramine (TOFRANIL) 50 MG tablet Take 200 mg by mouth at bedtime.   Yes [provider]  ipratropium-albuterol (DUONEB) 0.5-2.5 (3) MG/3ML SOLN Take 3 mLs by nebulization every 6 (six) hours. 08/19/16  Yes Enid BaasKalisetti, Radhika, MD  metoprolol succinate (TOPROL XL) 25 MG 24 hr tablet Take 25 mg by mouth daily.   Yes [provider]  risperiDONE (RISPERDAL) 2 MG tablet Take 4 mg by mouth at bedtime.   Yes [provider]     VITAL SIGNS:  Blood pressure (!) 135/97, pulse (!) 118, temperature 99.1 F (37.3 C), temperature source Oral, resp. rate 19, height 6' (1.829 m), weight 77.1 kg (170 lb), SpO2 100 %.  PHYSICAL EXAMINATION:  Physical Exam  GENERAL:  61 y.o.-year-old patient lying in the bed with Conversational dyspnea EYES: Pupils equal, round, reactive to light and accommodation. No scleral icterus. Extraocular muscles intact.  HEENT: Head atraumatic, normocephalic. Oropharynx and nasopharynx clear. No oropharyngeal erythema, moist oral mucosa  NECK:  Supple, no jugular venous distention. No thyroid enlargement, no tenderness.  LUNGS: Decreased air entry bilaterally with expiratory wheezing CARDIOVASCULAR: S1, S2 normal. No murmurs, rubs, or gallops.  ABDOMEN: Soft, nontender, nondistended. Bowel sounds present. No organomegaly or mass.  EXTREMITIES: No pedal edema, cyanosis, or clubbing. + 2 pedal & radial pulses b/l.   NEUROLOGIC: Cranial nerves II through XII are intact. No focal Motor or sensory deficits appreciated  b/l PSYCHIATRIC: The patient is alert and oriented x 3. Anxious SKIN: No obvious rash, lesion, or ulcer.   LABORATORY PANEL:   CBC  Recent Labs Lab 01/28/17 1201  WBC 13.0*  HGB 13.3  HCT 37.9*  PLT 416   ------------------------------------------------------------------------------------------------------------------  Chemistries   Recent Labs Lab 01/28/17 1201  NA 126*  K 3.4*  CL 95*  CO2 22  GLUCOSE 129*  BUN 9  CREATININE 0.66  CALCIUM 9.1  AST 26  ALT 11*  ALKPHOS 108  BILITOT 0.9   ------------------------------------------------------------------------------------------------------------------  Cardiac Enzymes  Recent Labs Lab 01/28/17 1201  TROPONINI <0.03   ------------------------------------------------------------------------------------------------------------------  RADIOLOGY:  Dg Chest 2 View  Result Date: 01/28/2017 CLINICAL DATA:  Shortness of Breath EXAM: CHEST  2 VIEW COMPARISON:  January 24, 2017 and January 14, 2017 FINDINGS: Lungs are somewhat hyperexpanded. There is no edema or consolidation. Relative lucency in the upper lobes is stable and likely represents bullous disease. Interstitial prominence in other parts the lungs in part may be due to redistribution of blood flow to viable lung segments. There is no frank edema or  consolidation. The heart size is normal. Mildly diminished pulmonary vascularity in the upper lobes may reflect bullous disease. No adenopathy. There is degenerative change in the thoracic spine. IMPRESSION: Findings indicative of a degree of bullous disease in the upper lobes. No edema or consolidation. Stable cardiac silhouette. No adenopathy evident. Electronically Signed   By: Bretta Bang III M.D.   On: 01/28/2017 12:11     IMPRESSION AND PLAN:   * Acute COPD exacerbation with acute on chronic respiratory failure due to ongoing tobacco use -IV steroids - Scheduled Nebulizers - Inhalers -Wean O2 as tolerated -  Consult pulmonary if no improvement  * Diabetes mellitus. Not in medications. Will add sliding scale insulin.  * Hypertension. Continue home medications of Toprol and Norvasc.  * Tobacco abuse Counseled patient to quit > 3 minutes Nicotine patch ordered  * DVT prophylaxis with Lovenox  Social work has been consulted from the emergency room due to patient not having extremity at home and for home needs.   All the records are reviewed and case discussed with ED provider. Management plans discussed with the patient, family and they are in agreement.  CODE STATUS: Partial code. No intubation  TOTAL TIME TAKING CARE OF THIS PATIENT: 40 minutes.   Milagros Loll R M.D on 01/28/2017 at 2:13 PM  Between 7am to 6pm - Pager - [phone]  After 6pm go to [email or web address] - password [email or web address] [facility]  [facility] [place] [facility]  Office  [phone]  CC: Primary care physician; Candie Chroman, DO  Note: This dictation was prepared with Dragon dictation along with smaller phrase technology. Any transcriptional errors that result from this process are unintentional.

## 2017-01-28 NOTE — Progress Notes (Signed)
Advance care planning  Discussed with the patient regarding his COPD and worsening respiratory status over the past few months. He wants his son and a meal to be his healthcare power of attorney. Has not spoken to his son in the past few days. Patient lives alone. Tablets with a walker at home. Has been losing weight progressively, losing 50 pounds to date. He understands his worsening respiratory status is due to he continued to smoke. Was to get better this admission and promises that he will quit smoking.  Discussed CODE STATUS with patient. He tells me that he would want to be resuscitated with CPR or defibrillation. Does not want to be intubated. He is okay to use oxygen or BiPAP as a temporary support.

## 2017-01-28 NOTE — Care Management Note (Signed)
Case Management Note  Patient Details  Name: Kristopher Powell MRN: [id number] Date of Birth: 07/02/1956  Subjective/Objective:        SPoek to patient after referral from MD. The patient has been at [facility] previously, and d/c'd 6/12. He was sent home on home 02. [facility] is his provider, and they state the patient has CPAP, a concentrator, backup tank, a portable tank, and has been getting nebulizer medications delivered. Per the rep from [employer], the patient was contacted as recently as the first of June and had no needs verbalized. Today he tells staff , including myself that he has had no power in his home for a month. He had not paid his bill for 5 or more months. He has said he spend his check on his property taxes, food an cigarettes. He says he has no idea where his won lives in [place], and that he rarely has contact with him. I have tried to contact Leim Fabryita Cooper at [employer]. [place] DSS at both [phone] and tha main number of [phone], and had no luck. I tried the energy assistance line and it says they are out of funds. I have asked the MD for a CSW consult to help us try to see what else might be available.            Action/Plan:   Expected Discharge Date:                  Expected Discharge Plan:     In-House Referral:     Discharge planning Services     Post Acute Care Choice:    Choice offered to:     DME Arranged:    DME Agency:     HH Arranged:    HH Agency:     Status of Service:     If discussed at [employer] Length of Stay Meetings, dates discussed:    Additional Comments:  Berna BueCheryl Apostolos Blagg, RN 01/28/2017, 12:50 PM

## 2017-01-28 NOTE — Care Management Note (Signed)
Case Management Note  Patient Details  Name: Kristopher Powell MRN: [id number] Date of Birth: 06/06/1956  Subjective/Objective: Tried again to get in touch with Leim Fabryita Cooper at [facility], with no luck at [phone].                   Action/Plan:   Expected Discharge Date:                  Expected Discharge Plan:     In-House Referral:     Discharge planning Services     Post Acute Care Choice:    Choice offered to:     DME Arranged:    DME Agency:     HH Arranged:    HH Agency:     Status of Service:     If discussed at [employer] Length of Stay Meetings, dates discussed:    Additional Comments:  Berna BueCheryl Sandra Brents, RN 01/28/2017, 1:56 PM

## 2017-01-28 NOTE — ED Notes (Signed)
Pt resting in bed, given peanut butter and crackers, aware of pending admission

## 2017-01-28 NOTE — ED Notes (Signed)
Per pt he has no electricity for his home o2, states he has not had a bath in 1 week, pt has body odor and appears dirty, case manger Elnita MaxwellCheryl made aware

## 2017-01-28 NOTE — ED Triage Notes (Signed)
Pt arrives via ems from home, pt d/c'd from hospital on Monday, pt wearing hospital paper scrubs, pt appears unkept, pt appears anxious, states that his breathing got bad at 0700. Pt states that he used his own treatment prior to calling 911 for help, pt was given 125mg  solumedrol iv via ems as well as a duoneb treatment and an albuterol treatement, pt states that he is supposed to be on O2 at home, pt asked if he is using his O2, pt states "no" pt asked why, pt states, "I don't have any power and haven't for almost a month, my bill go too high during the winter and they have cut it off." pt asked if he has spoken with anyone about getting assistance to have it turned back on, pt states, "yes"

## 2017-01-28 NOTE — ED Provider Notes (Signed)
[facility] Emergency Department Provider Note  Time seen: 11:50 AM  I have reviewed the triage vital signs and the nursing notes.   HISTORY  Chief Complaint No chief complaint on file.    HPI Kristopher Powell is a 61 y.o. male with a past medical history CHF, COPD on 4.5 L of oxygen 24/7, diabetes, hypertension, MI presents to the emergency department for difficulty breathing. Patient is a current every day smoker. He states he was admitted to the hospital approximately 5 days ago for COPD and was ultimately discharged home. Patient states he was feeling better until 7:00 this morning when he became acutely short of breath once again. Denies any chest pain. States some cough but denies any fever. Denies abdominal pain nausea or vomiting. Patient received 125 mg of Solu-Medrol by EMS.  Past Medical History:  Diagnosis Date  . CHF (congestive heart failure) (HCC)   . COPD (chronic obstructive pulmonary disease) (HCC)   . Diabetes mellitus without complication (HCC)   . Hypertension   . MI (myocardial infarction) [facility](HCC) 2006    Patient Active Problem List   Diagnosis Date Noted  . 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 Tardive dyskinesia 01/24/2017  . SIRS (systemic inflammatory response syndrome) (HCC) 09/05/2016  . Acute URI 09/05/2016  . Acute respiratory failure (HCC) 09/05/2016  . Acute on chronic respiratory failure with hypoxia and hypercapnia (HCC) 08/14/2016  . Chest pain 08/07/2016  . Dehydration with hyponatremia 08/06/2016  . Chest pain, rule out acute myocardial infarction 08/06/2016  . CHF (congestive heart failure) (HCC)   . Generalized anxiety disorder 08/14/2015  . Benzodiazepine abuse 08/14/2015  . Acute delirium 08/14/2015  . Diabetes (HCC) 08/14/2015  . COPD (chronic obstructive pulmonary disease) (HCC) 08/14/2015  . Suicidal ideation 08/14/2015  . Hyponatremia 05/23/2015  . Hypokalemia 05/23/2015    Past Surgical History:   Procedure Laterality Date  . APPENDECTOMY    . CHOLECYSTECTOMY    . CORONARY ANGIOPLASTY WITH STENT PLACEMENT      Prior to Admission medications   Medication Sig Start Date End Date Taking? Authorizing Provider  albuterol (PROVENTIL HFA;VENTOLIN HFA) 108 (90 Base) MCG/ACT inhaler Inhale 2 puffs into the lungs every 6 (six) hours as needed for wheezing or shortness of breath.    [provider]  amLODipine (NORVASC) 5 MG tablet Take 5 mg by mouth daily.    [provider]  aspirin EC 325 MG tablet Take 325 mg by mouth daily.    [provider]  diazepam (VALIUM) 10 MG tablet Take 1 tablet (10 mg total) by mouth 3 (three) times daily. 09/07/16   Adrian SaranMody, Sital, MD  esomeprazole (NEXIUM) 40 MG capsule Take 40 mg by mouth daily.    [provider]  Fluticasone-Salmeterol (ADVAIR) 100-50 MCG/DOSE AEPB Inhale 1 puff into the lungs 2 (two) times daily. 01/16/17   Shaune Pollackhen, Qing, MD  gabapentin (NEURONTIN) 300 MG capsule Take 1,200 mg by mouth 3 (three) times daily.     [provider]  imipramine (TOFRANIL) 50 MG tablet Take 200 mg by mouth at bedtime.    [provider]  ipratropium-albuterol (DUONEB) 0.5-2.5 (3) MG/3ML SOLN Take 3 mLs by nebulization every 6 (six) hours. 08/19/16   Enid BaasKalisetti, Radhika, MD  metoprolol succinate (TOPROL XL) 25 MG 24 hr tablet Take 25 mg by mouth daily.    [provider]  risperiDONE (RISPERDAL) 2 MG tablet Take 4 mg by mouth at bedtime.    [provider]  No Known Allergies  Family History  Problem Relation Age of Onset  . CAD Mother        deceased 37  . Stroke Father        deceased 20  . Heart attack Brother        deceased 64    Social History Social History  Substance Use Topics  . Smoking status: Current Every Day Smoker    Packs/day: 1.00    Years: 40.00    Types: Cigarettes  . Smokeless tobacco: Never Used  . Alcohol use 7.2 - 14.4 oz/week    12 - 24 Cans of beer per week      Comment: occ    Review of Systems Constitutional: Negative for fever Cardiovascular: Negative for chest pain. Respiratory:Positive for shortness of breath Gastrointestinal: Negative for abdominal pain, vomiting  Genitourinary: Negative for dysuria. Musculoskeletal: Negative for back pain. Skin: Negative for rash. Neurological: Negative for headache All other ROS negative  ____________________________________________   PHYSICAL EXAM:  Constitutional: Alert and oriented. Well appearing and in no distress. Eyes: Normal exam ENT   Head: Normocephalic and atraumatic.   Mouth/Throat: Mucous membranes are moist. Cardiovascular: Normal rate, regular rhythm. No murmur Respiratory: Decreased air movement bilaterally. No obvious wheeze but the patient is moving very little air at this time. Gastrointestinal: Soft and nontender. No distention.  Musculoskeletal: Nontender with normal range of motion in all extremities. No lower extremity tenderness or edema. Neurologic:  Normal speech and language. No gross focal neurologic deficits  Skin:  Skin is warm, dry and intact.  Psychiatric: Mood and affect are normal.   ____________________________________________    EKG  EKG reviewed and interpreted by myself shows sinus tachycardia 121 bpm, slightly widened QRS, normal axis, Larson normal intervals with nonspecific ST changes without ST elevation.  ____________________________________________    RADIOLOGY  IMPRESSION: Findings indicative of a degree of bullous disease in the upper lobes. No edema or consolidation. Stable cardiac silhouette. No adenopathy evident.  ____________________________________________   INITIAL IMPRESSION / ASSESSMENT AND PLAN / ED COURSE  Pertinent labs & imaging results that were available during my care of the patient were reviewed by me and considered in my medical decision making (see chart for details).  The patient presents the emergency  department for trouble breathing, has a history of significant COPD with recent discharge 3 days ago from the hospital after COPD admission. We will check labs, chest x-ray, treat with breathing treatments in the emergency department. Patient is maintaining a low to mid 90 saturation on 4.5 L.  Patient is short of breath with generalized fatigue and weakness. Patient's medical workup shows no acute change and chest x-ray but he does have hyponatremia with a sodium of 126. His baseline hyponatremia appears to be around 130-133. Given the patient's weakness with increased shortness of breath and now hyponatremia we will admit to the hospital for further treatment.  ____________________________________________   FINAL CLINICAL IMPRESSION(S) / ED DIAGNOSES  COPD exacerbation Dyspnea General weakness hyponatremia    Minna Antis, MD 01/28/17 1350

## 2017-01-29 LAB — BASIC METABOLIC PANEL
Anion gap: 4 — ABNORMAL LOW (ref 5–15)
BUN: 12 mg/dL (ref 6–20)
CALCIUM: 8.9 mg/dL (ref 8.9–10.3)
CO2: 24 mmol/L (ref 22–32)
CREATININE: 0.65 mg/dL (ref 0.61–1.24)
Chloride: 104 mmol/L (ref 101–111)
GFR calc Af Amer: >60 mL/min (ref >60)
GFR calc non Af Amer: >60 mL/min (ref >60)
GLUCOSE: 165 mg/dL — AB (ref 65–99)
Potassium: 4.1 mmol/L (ref 3.5–5.1)
Sodium: 132 mmol/L — ABNORMAL LOW (ref 135–145)

## 2017-01-29 LAB — GLUCOSE, CAPILLARY
GLUCOSE-CAPILLARY: 152 mg/dL — AB (ref 65–99)
GLUCOSE-CAPILLARY: 171 mg/dL — AB (ref 65–99)
Glucose-Capillary: 112 mg/dL — ABNORMAL HIGH (ref 65–99)
Glucose-Capillary: 146 mg/dL — ABNORMAL HIGH (ref 65–99)

## 2017-01-29 LAB — CBC
HCT: 34.1 % — ABNORMAL LOW (ref 40.0–52.0)
Hemoglobin: 11.7 g/dL — ABNORMAL LOW (ref 13.0–18.0)
MCH: 27.5 pg (ref 26.0–34.0)
MCHC: 34.4 g/dL (ref 32.0–36.0)
MCV: 79.9 fL — ABNORMAL LOW (ref 80.0–100.0)
Platelets: 409 10*3/uL (ref 150–440)
RBC: 4.27 MIL/uL — ABNORMAL LOW (ref 4.40–5.90)
RDW: 16 % — AB (ref 11.5–14.5)
WBC: 15.8 10*3/uL — ABNORMAL HIGH (ref 3.8–10.6)

## 2017-01-29 LAB — HEMOGLOBIN A1C
HEMOGLOBIN A1C: 5.5 % (ref 4.8–5.6)
Mean Plasma Glucose: 111 mg/dL

## 2017-01-29 MED ORDER — SODIUM CHLORIDE 0.9% FLUSH
3.0000 mL | Freq: Two times a day (BID) | INTRAVENOUS | Status: DC
  Administered 2017-01-29 – 2017-02-11 (×26): 3 mL via INTRAVENOUS

## 2017-01-29 MED ORDER — SODIUM CHLORIDE 0.9% FLUSH: 3.0000 mL | INTRAVENOUS | Status: DC | PRN

## 2017-01-29 NOTE — Progress Notes (Signed)
No co's. 02 weaned to room air. No respiratory limiting issues. Eats well; constantly requests more food/beveraged. Pt asks every person who comes into his room for extra po's with noncompliance with fluid restriction.SSI coveraged required x 1 for elevated blood sugar.

## 2017-01-29 NOTE — Clinical Social Work Note (Signed)
Clinical Social Work Assessment  Patient Details  Name: Kristopher Powell MRN: [id number] Date of Birth: Mar 28, 1956  Date of referral:  01/29/17               Reason for consult:  Housing Concerns/Homelessness, Frequent Admissions / ED Visits, Financial Concerns, Guardianship Needs, [employer]                Permission sought to share information with:    Permission granted to share information::     Name::        Agency::     Relationship::     Contact Information:     Housing/Transportation Living arrangements for the past 2 months:  Single Family Home Source of Information:  Patient, Medical Team Patient Interpreter Needed:  None Criminal Activity/Legal Involvement Pertinent to Current Situation/Hospitalization:  No - Comment as needed Significant Relationships:  Adult Children Lives with:  Self Do you feel safe going back to the place where you live?  Yes Need for family participation in patient care:  No (Coment)  Care giving concerns:  Patient has no electricity at home and has o2 needs   Facilities manager / plan:  CSW met with the patient at bedside to discuss his living situation with regards to his medical needs. The patient reported that his son sees him every week, but is not supportive. According to past admission notes, the patient has told psych that his son takes him to all appointments. The CSW asked the patient about his electricity. The patient reports that it was disconnected due to non-payment. The CSW asked if he had told the company that he has o2 needs. The patient reported that he had but the electric company "didn't care". The CSW asked if the patient has ever attempted to receive services from DSS due to disability. The patient reported that he has SSI, but that DSS "told me that they don't have money for me.".  During the discussion, the CSW observed that the patient had significantly overgrown fingernails that were dirty and signs of unclean  hair (oily, collection of dried skin). Through chart review during one of the patient's multiple admissions, the CSW observed that the patient has been found wandering and covered in ticks. Due to the state of the patient's hygiene and his history of possible medication non-compliance and possible self-neglect, the CSW reported the patient to [facility].   [facility] collected the patient's information and will attempt to contact him at the hospital if possible. [facility] is aware that the patient is not in imminent danger if he discharges home.  Employment status:  Retired, Disabled (Comment on whether or not currently receiving Disability) (Patient received disability) Insurance information:  Medicaid [place] PT Recommendations:  Not assessed at this time Information / Referral to community resources:  [facility] (Comment Required: [place], Name & Number of worker spoken with) ([place]/Report filed)  Patient/Family's Response to care:  The patient was guarded in his answers but thanked the CSW for assistance.  Patient/Family's Understanding of and Emotional Response to Diagnosis, Current Treatment, and Prognosis:  The patient does not seem to fully understand his medical needs. [facility] has been contacted for possible guardianship needs and self-neglect.  Emotional Assessment Appearance:  Appears older than stated age Attitude/Demeanor/Rapport:  Apprehensive, Avoidant, Inconsistent Affect (typically observed):  Withdrawn, In denial, Quiet Orientation:  Oriented to Self, Oriented to Place, Oriented to  Time, Oriented to Situation Alcohol / Substance use:  Alcohol Use (History of ETOH/Denies current  use) Psych involvement (Current and /or in the community):  Yes (Comment) (Has been seen by psych on past admissions)  Discharge Needs  Concerns to be addressed:  Basic Needs, Decision making concerns, Home Safety Concerns, Lack of Support, Substance Abuse Concerns, Compliance Issues Concerns, Discharge Planning  Concerns Readmission within the last 30 days:  Yes Current discharge risk:  Chronically ill, Inadequate Financial Supports, Lack of support system, Substance Abuse, Lives alone Barriers to Discharge:  Continued Medical Work up   [employer], LCSW 01/29/2017, 4:51 PM

## 2017-01-29 NOTE — Progress Notes (Signed)
[employer] [facility] - [place] at [facility]   PATIENT NAME: Kristopher Powell    MR#:  [id number]  DATE OF BIRTH:  October 24, 1955  SUBJECTIVE:  CHIEF COMPLAINT:   Chief Complaint  Patient presents with  . Respiratory Distress   Still complains of shortness of breath and dry cough. No pain. Afebrile. On 3 L oxygen.  REVIEW OF SYSTEMS:    Review of Systems  Constitutional: Positive for malaise/fatigue. Negative for chills and fever.  HENT: Negative for sore throat.   Eyes: Negative for blurred vision, double vision and pain.  Respiratory: Positive for cough, shortness of breath and wheezing. Negative for hemoptysis.   Cardiovascular: Negative for chest pain, palpitations, orthopnea and leg swelling.  Gastrointestinal: Negative for abdominal pain, constipation, diarrhea, heartburn, nausea and vomiting.  Genitourinary: Negative for dysuria and hematuria.  Musculoskeletal: Negative for back pain and joint pain.  Skin: Negative for rash.  Neurological: Positive for weakness. Negative for sensory change, speech change, focal weakness and headaches.  Endo/Heme/Allergies: Does not bruise/bleed easily.  Psychiatric/Behavioral: Negative for depression. The patient is nervous/anxious.     DRUG ALLERGIES:  No Known Allergies  VITALS:  Blood pressure 99/65, pulse 91, temperature 97.9 F (36.6 C), temperature source Oral, resp. rate 20, height 6' (1.829 m), weight 73.8 kg (162 lb 9.6 oz), SpO2 100 %.  PHYSICAL EXAMINATION:   Physical Exam  GENERAL:  61 y.o.-year-old patient lying in the bed with no acute distress.  EYES: Pupils equal, round, reactive to light and accommodation. No scleral icterus. Extraocular muscles intact.  HEENT: Head atraumatic, normocephalic. Oropharynx and nasopharynx clear.  NECK:  Supple, no jugular venous distention. No thyroid enlargement, no tenderness.  LUNGS: Decreased air entry with bilateral expiratory wheezing CARDIOVASCULAR: S1, S2 normal. No  murmurs, rubs, or gallops.  ABDOMEN: Soft, nontender, nondistended. Bowel sounds present. No organomegaly or mass.  EXTREMITIES: No cyanosis, clubbing or edema b/l.    NEUROLOGIC: Cranial nerves II through XII are intact. No focal Motor or sensory deficits b/l.   PSYCHIATRIC: The patient is alert and awake. Anxious SKIN: No obvious rash, lesion, or ulcer.   LABORATORY PANEL:   CBC  Recent Labs Lab 01/29/17 0411  WBC 15.8*  HGB 11.7*  HCT 34.1*  PLT 409   ------------------------------------------------------------------------------------------------------------------ Chemistries   Recent Labs Lab 01/28/17 1201 01/29/17 0411  NA 126* 132*  K 3.4* 4.1  CL 95* 104  CO2 22 24  GLUCOSE 129* 165*  BUN 9 12  CREATININE 0.66 0.65  CALCIUM 9.1 8.9  AST 26  --   ALT 11*  --   ALKPHOS 108  --   BILITOT 0.9  --    ------------------------------------------------------------------------------------------------------------------  Cardiac Enzymes  Recent Labs Lab 01/28/17 1201  TROPONINI <0.03   ------------------------------------------------------------------------------------------------------------------  RADIOLOGY:  Dg Chest 2 View  Result Date: 01/28/2017 CLINICAL DATA:  Shortness of Breath EXAM: CHEST  2 VIEW COMPARISON:  January 24, 2017 and January 14, 2017 FINDINGS: Lungs are somewhat hyperexpanded. There is no edema or consolidation. Relative lucency in the upper lobes is stable and likely represents bullous disease. Interstitial prominence in other parts the lungs in part may be due to redistribution of blood flow to viable lung segments. There is no frank edema or consolidation. The heart size is normal. Mildly diminished pulmonary vascularity in the upper lobes may reflect bullous disease. No adenopathy. There is degenerative change in the thoracic spine. IMPRESSION: Findings indicative of a degree of bullous disease in the upper lobes. No edema or  consolidation. Stable  cardiac silhouette. No adenopathy evident. Electronically Signed   By: Bretta BangWilliam  Woodruff III M.D.   On: 01/28/2017 12:11     ASSESSMENT AND PLAN:    * Acute COPD exacerbation with acute on chronic respiratory failure due to ongoing tobacco use -IV steroids - Scheduled Nebulizers - Inhalers -Wean O2 as tolerated Slowly improving  * Chronic mild hyponatremia due to pulmonary issues. Is stable. He is also supposed to be on fluid restriction. Recent nephrology notes. Order was entered.  * h/o Diabetes mellitus Or hyperglycemia due to steroids. Recent hemoglobin A1c is 5.5. He does mention he has lost significant amount of weight and could've helped with his diabetes. We'll continue sliding scale insulin while he is on steroids.  * Hypertension. Continue home medications of Toprol and Norvasc.  * Tobacco abuse Counseled patient to quit  * DVT prophylaxis with Lovenox  All the records are reviewed and case discussed with Care Management/Social Worker Management plans discussed with the patient, family and they are in agreement.  CODE STATUS: Partial code  DVT Prophylaxis: SCDs  TOTAL TIME TAKING CARE OF THIS PATIENT: 30 minutes.   POSSIBLE D/C IN 1-2 DAYS, DEPENDING ON CLINICAL CONDITION.  Milagros LollSudini, Honore Wipperfurth R M.D on 01/29/2017 at 10:21 AM  Between 7am to 6pm - Pager - [phone]  After 6pm go to [email or web address] - password [email or web address]  [facility]  [facility] [place] [facility]  Office  [phone]  CC: Primary care physician; Candie ChromanBarzin, Amir Homayoun, DO  Note: This dictation was prepared with Dragon dictation along with smaller phrase technology. Any transcriptional errors that result from this process are unintentional.

## 2017-01-30 LAB — GLUCOSE, CAPILLARY
GLUCOSE-CAPILLARY: 144 mg/dL — AB (ref 65–99)
GLUCOSE-CAPILLARY: 189 mg/dL — AB (ref 65–99)
GLUCOSE-CAPILLARY: 121 mg/dL — AB (ref 65–99)
Glucose-Capillary: 157 mg/dL — ABNORMAL HIGH (ref 65–99)

## 2017-01-30 MED ORDER — DIAZEPAM 10 MG PO TABS: 5.0000 mg | ORAL_TABLET | Freq: Two times a day (BID) | ORAL | 0 refills | Status: DC | PRN

## 2017-01-30 MED ORDER — PREDNISONE 50 MG PO TABS
50.0000 mg | ORAL_TABLET | Freq: Every day | ORAL | Status: DC
Start: 2017-01-31 — End: 2017-02-01
  Administered 2017-01-31 – 2017-02-01 (×2): 50 mg via ORAL
  Filled 2017-01-30 (×2): qty 1

## 2017-01-30 MED ORDER — IPRATROPIUM-ALBUTEROL 0.5-2.5 (3) MG/3ML IN SOLN
3.0000 mL | Freq: Four times a day (QID) | RESPIRATORY_TRACT | Status: DC
  Administered 2017-01-30 – 2017-02-02 (×10): 3 mL via RESPIRATORY_TRACT
  Filled 2017-01-30 (×11): qty 3

## 2017-01-30 MED ORDER — PREDNISONE 50 MG PO TABS: 50.0000 mg | ORAL_TABLET | Freq: Every day | ORAL | 0 refills | Status: DC

## 2017-01-30 NOTE — Evaluation (Signed)
Physical Therapy Evaluation Patient Details Name: Kristopher Powell MRN: [id number] DOB: September 25, 1955 Today's Date: 01/30/2017   History of Present Illness  Pt is a 61 yo M, admitted to acute care with COPD exacerbation on 01/28/17. Prior to admission pt independent with ADL's and utilized RW for navigation within home/community. Per pt report, he utilizes 4.5L supplemental O2 24/7, but has not been able to recently, secondary to no electricity within the home. PMH includes the following: tobacco use, COPD, chronic respiratory failure on 4L O2, neuropathy, GERD, depression, CHF, diabetes, HTN, and MI.   Clinical Impression  Pt is a pleasant 61 y.o. M, admitted to acute care for COPD exacerbation. Pt performs bed mobility and tranfers with ModI due to mild reports of dizziness, which resolves quickly. Ambulation performed with CGA-ModA, as pt required greater assist with greater distances. Following ~20-30 ft pt became dizzy and began stumbling over his ft., with cont amb, second person required for chair follow to maintain safety. Pt on room air throughout duration of treatment. O2 sats dropped to 88% following amb, but pt recovered quickly(>90%) with cues to practice pursed lipped breathing. Pt presents with the following deficits: strength, and endurance. Overall, pt responded well to today's treatment with no adverse affects. Pt would benefit from skilled PT to address the previously mentioned impairments and promote return to PLOF. Recommending home health PT pending d/c, as pt reports son Reuel Boom) being able to stay with him 24/7, if needed.     Follow Up Recommendations Home health PT    Equipment Recommendations       Recommendations for Other Services       Precautions / Restrictions Precautions Precautions: Fall Restrictions Weight Bearing Restrictions: No      Mobility  Bed Mobility Overal bed mobility: Modified Independent             General bed mobility comments: Pt able  to perform supine to sit independently, though ModI with reports of slight dizziness upon sitting. Vitals maintained at 98% on room air, and dizziness dissipated quicklly.   Transfers Overall transfer level: Modified independent Equipment used: Rolling walker (2 wheeled)             General transfer comment: ModI with STS transfers, secondary to LE weakness and reports of mild dizziness upon immediate stand. Dizziness resolved quickly and O2 sats maintained at 97% on room air upon standing.   Ambulation/Gait Ambulation/Gait assistance: Mod assist;Min guard;Min assist;+2 safety/equipment Ambulation Distance (Feet): 75 Feet (Rest break after 50 ft. ) Assistive device: Rolling walker (2 wheeled) Gait Pattern/deviations: Step-through pattern     General Gait Details: CGA-ModA with reciprical gait pattern, with cues to remain inside the walker and verbalize resting needs to SPT. At initiation of amb [facility] at [facility]; once amb ~20-55ft, pt c/o dizziness and began staggaring, fluctuating between requirements of Min-ModA. O2 sats dropped to 88% following amb, but recovered quickly to >90% with cues to perform pursed lipped breathing. Following rest break, pt required chair follow for safety with continuing amb.  Stairs            Wheelchair Mobility    Modified Rankin (Stroke Patients Only)       Balance Overall balance assessment: Modified Independent Sitting-balance support: Feet supported Sitting balance-Leahy Scale: Good Sitting balance - Comments: ModI with sitting balance, secondary to mild c/o of dizziness, which resolves quickly with pursed lipped breathing.    Standing balance support: Bilateral upper extremity supported Standing balance-Leahy Scale: Fair Standing balance comment:  Initial standing balance is good, requiring CGA, as pt cont to stand, his balance declines due to dizziness and LE weakness, requiring Min-ModA.                              Pertinent  Vitals/Pain Pain Assessment: Faces Faces Pain Scale: Hurts a little bit Pain Location: Arms/legs  ("Arthritis") Pain Descriptors / Indicators: Constant;Discomfort Pain Intervention(s): Limited activity within patient's tolerance;Monitored during session    Home Living Family/patient expects to be discharged to:: Private residence   Available Help at Discharge: Family (Reports son, Reuel BoomDaniel, available for 24/7 assist.) Type of Home: House Home Access: Level entry     Home Layout: One level Home Equipment: Walker - 2 wheels;Cane - single point      Prior Function Level of Independence: Independent with assistive device(s)         Comments: ModI with RW to navigate home community.      Hand Dominance        Extremity/Trunk Assessment   Upper Extremity Assessment Upper Extremity Assessment: Overall WFL for tasks assessed    Lower Extremity Assessment Lower Extremity Assessment: Generalized weakness (MMT: grossly 4/5 (excessive B knee varus))       Communication   Communication: No difficulties  Cognition Arousal/Alertness: Awake/alert Behavior During Therapy: WFL for tasks assessed/performed Overall Cognitive Status: Within Functional Limits for tasks assessed                                 General Comments: Pt quiet, but tolerated therapy well. Willing to participate.       General Comments      Exercises Other Exercises Other Exercises: Supine therex performed with supervision on room air, with O2 sat maintained > 90%. x10 reps: SLR, ankle pumps, quad sets, glute squeezes. Pt education re: pursed lipped breathing adn performing exercises when seated/in bed to maintain adequate blood flow.    Assessment/Plan    PT Assessment Patient needs continued PT services  PT Problem List Decreased strength;Decreased activity tolerance;Decreased balance;Decreased mobility;Decreased coordination;Decreased safety awareness       PT Treatment Interventions  DME instruction;Gait training;Functional mobility training;Therapeutic activities;Therapeutic exercise;Balance training;Patient/family education    PT Goals (Current goals can be found in the Care Plan section)  Acute Rehab PT Goals Patient Stated Goal: "to get to where I can walk again, and stand for a while." PT Goal Formulation: With patient Time For Goal Achievement: 02/13/17 Potential to Achieve Goals: Good    Frequency Min 2X/week   Barriers to discharge        Co-evaluation               AM-PAC PT "6 Clicks" Daily Activity  Outcome Measure Difficulty turning over in bed (including adjusting bedclothes, sheets and blankets)?: A Little Difficulty moving from lying on back to sitting on the side of the bed? : A Little Difficulty sitting down on and standing up from a chair with arms (e.g., wheelchair, bedside commode, etc,.)?: A Little Help needed moving to and from a bed to chair (including a wheelchair)?: A Little Help needed walking in hospital room?: A Lot Help needed climbing 3-5 steps with a railing? : A Lot 6 Click Score: 16    End of Session Equipment Utilized During Treatment: Gait belt Activity Tolerance: Patient tolerated treatment well Patient left: in bed;with call bell/phone within reach;with bed alarm  set (Applied 2L supplemental O2 at end of session per pt request.) Nurse Communication: Mobility status PT Visit Diagnosis: Muscle weakness (generalized) (M62.81);Difficulty in walking, not elsewhere classified (R26.2)    Time: 1610-9604 PT Time Calculation (min) (ACUTE ONLY): 36 min   Charges:         PT G Codes:        Sharman Cheek PT, SPT   Latanya Maudlin 01/30/2017, 10:42 AM

## 2017-01-30 NOTE — Discharge Instructions (Signed)
QUIT SMOKING  Take medications as prescribed.

## 2017-01-30 NOTE — Progress Notes (Signed)
Initial Nutrition Assessment  DOCUMENTATION CODES:   Severe malnutrition in context of social or environmental circumstances  INTERVENTION:  Encouraged ongoing adequate intake of calories and protein at meals as patient is meeting his needs with just meals.   Provided patient with handout on food and nutrition resources in [place]. Patient reports he will call upon discharge and try to utilize more resources.   Monitor magnesium, potassium, and phosphorus daily for at least 3 days, MD to replete as needed, as pt is at risk for refeeding syndrome given severe malnutrition in setting of social/environmental circumstances.  At this time patient does not need an oral nutrition supplement as he is meeting needs with meals. Will continue to monitor needs and PO intake.  NUTRITION DIAGNOSIS:   Malnutrition (Severe) related to social / environmental circumstances (limited access to food) as evidenced by 20.6 percent weight loss over 6 months, severe depletion of body fat, moderate depletions of muscle mass, severe depletion of muscle mass.  GOAL:   Patient will meet greater than or equal to 90% of their needs  MONITOR:   PO intake, Labs  REASON FOR ASSESSMENT:   Malnutrition Screening Tool    ASSESSMENT:   61 year old male with PMHx of COPD, DM, CHF, HTN, hx of MI in 2006 admitted with acute exacerbation of COPD, chronic hyponatremia due to pulmonary issues on 2 L fluid restriction.   Spoke with patient at bedside. He reports he has not been eating well for a while now. Only eats 1-2 meals per day. He reports he receives Meals on Wheels and may also eat again if he can. Reporting limited access to food. He reports he is not aware of any other food assistance programs besides Meals on Wheels. Patient is eating well here. Asking for peanut butter and graham crackers, but after discussion with RN patient is eating very well and has some elevated blood sugar at this time. Patient  denies any N/V, abdominal pain, constipation/diarrhea, difficulty chewing/swallowing.  Patient reports UBW was 212 lbs. Per chart he was 209.3 lbs on 08/06/2016. He has lost 43.1 lbs (20.6% body weight) over the past 6 months, which is significant for time frame.  Meal Completion: 100% In the past 24 hours patient has had at least 2484 kcal (>100% minimum estimated kcal needs) and 103 grams of protein (100% estimated protein needs), not including the snacks he has been receiving between meals.   Medications reviewed and include: Novolog 0-15 units TID, Novolog 0-5 units QHS, pantoprazole, prednisone 50 mg daily.  Labs reviewed: CBG 144-189 past 24 hrs. Sodium 132 on 6/16.  Nutrition-Focused physical exam completed. Findings are severe fat depletion, moderate-severe muscle depletion, and no edema. Poor dentition.  Discussed with RN. Patient eating very well. Not being compliant with fluid restriction.  Diet Order:  Diet regular Room service appropriate? Yes; Fluid consistency: Thin; Fluid restriction: 2000 mL Fluid  Skin:  Wound (see comment) (cellulitis left leg)  Last BM:  01/28/2017  Height:   Ht Readings from Last 1 Encounters:  01/28/17 6' (1.829 m)    Weight:   Wt Readings from Last 1 Encounters:  01/30/17 166 lb 3.2 oz (75.4 kg)    Ideal Body Weight:  80.9 kg  BMI:  Body mass index is 22.54 kg/m.  Estimated Nutritional Needs:   Kcal:  [id number] (MSJ x 1.3-1.4)  Protein:  100-115 grams (1.3-1.5 grams/kg)  Fluid:  2 L/day fluid restriction per MD  EDUCATION NEEDS:   Education needs addressed  Willey Blade, MS, RD, LDN Pager: [phone] After Hours Pager: [phone]

## 2017-01-30 NOTE — Progress Notes (Signed)
[employer] - [place] at [facility]   PATIENT NAME: Kristopher Powell    MR#:  [id number]  DATE OF BIRTH:  November 14, 1955  SUBJECTIVE:  CHIEF COMPLAINT:   Chief Complaint  Patient presents with  . Respiratory Distress   Still has shortness of breath. On room air today. Needing oxygen on ambulation. Feels weak  REVIEW OF SYSTEMS:    Review of Systems  Constitutional: Positive for malaise/fatigue. Negative for chills and fever.  HENT: Negative for sore throat.   Eyes: Negative for blurred vision, double vision and pain.  Respiratory: Positive for cough, shortness of breath and wheezing. Negative for hemoptysis.   Cardiovascular: Negative for chest pain, palpitations, orthopnea and leg swelling.  Gastrointestinal: Negative for abdominal pain, constipation, diarrhea, heartburn, nausea and vomiting.  Genitourinary: Negative for dysuria and hematuria.  Musculoskeletal: Negative for back pain and joint pain.  Skin: Negative for rash.  Neurological: Positive for weakness. Negative for sensory change, speech change, focal weakness and headaches.  Endo/Heme/Allergies: Does not bruise/bleed easily.  Psychiatric/Behavioral: Negative for depression. The patient is nervous/anxious.     DRUG ALLERGIES:  No Known Allergies  VITALS:  Blood pressure 124/75, pulse 97, temperature 97.9 F (36.6 C), temperature source Oral, resp. rate 18, height 6' (1.829 m), weight 75.4 kg (166 lb 3.2 oz), SpO2 100 %.  PHYSICAL EXAMINATION:   Physical Exam  GENERAL:  61 y.o.-year-old patient lying in the bed with no acute distress.  EYES: Pupils equal, round, reactive to light and accommodation. No scleral icterus. Extraocular muscles intact.  HEENT: Head atraumatic, normocephalic. Oropharynx and nasopharynx clear.  NECK:  Supple, no jugular venous distention. No thyroid enlargement, no tenderness.  LUNGS: Decreased air entry with bilateral expiratory wheezing CARDIOVASCULAR: S1, S2 normal. No  murmurs, rubs, or gallops.  ABDOMEN: Soft, nontender, nondistended. Bowel sounds present. No organomegaly or mass.  EXTREMITIES: No cyanosis, clubbing or edema b/l.    NEUROLOGIC: Cranial nerves II through XII are intact. No focal Motor or sensory deficits b/l.   PSYCHIATRIC: The patient is alert and awake. Anxious SKIN: No obvious rash, lesion, or ulcer.   LABORATORY PANEL:   CBC  Recent Labs Lab 01/29/17 0411  WBC 15.8*  HGB 11.7*  HCT 34.1*  PLT 409   ------------------------------------------------------------------------------------------------------------------ Chemistries   Recent Labs Lab 01/28/17 1201 01/29/17 0411  NA 126* 132*  K 3.4* 4.1  CL 95* 104  CO2 22 24  GLUCOSE 129* 165*  BUN 9 12  CREATININE 0.66 0.65  CALCIUM 9.1 8.9  AST 26  --   ALT 11*  --   ALKPHOS 108  --   BILITOT 0.9  --    ------------------------------------------------------------------------------------------------------------------  Cardiac Enzymes  Recent Labs Lab 01/28/17 1201  TROPONINI <0.03   ------------------------------------------------------------------------------------------------------------------  RADIOLOGY:  No results found.   ASSESSMENT AND PLAN:    * Acute COPD exacerbation with acute on chronic respiratory failure due to ongoing tobacco use Still has wheezing. Shortness of breath on ambulation. We'll change IV steroids to prednisone. We will continue inpatient care with ongoing nebulizer treatment.  * Chronic mild hyponatremia due to pulmonary issues. Is stable. He is also supposed to be on fluid restriction. Recent nephrology notes. Order was entered.  * h/o Diabetes mellitus Or hyperglycemia due to steroids. Recent hemoglobin A1c is 5.5.  * Hypertension. Continue home medications of Toprol and Norvasc.  * Tobacco abuse Counseled patient to quit On admission  * DVT prophylaxis with Lovenox  All the records are reviewed and case  discussed  with Care Management/Social Worker Management plans discussed with the patient, family and they are in agreement.  CODE STATUS: Partial code  DVT Prophylaxis: SCDs  TOTAL TIME TAKING CARE OF THIS PATIENT: 30 minutes.   POSSIBLE D/C IN 1-2 DAYS, DEPENDING ON CLINICAL CONDITION.  Milagros LollSudini, Kip Kautzman R M.D on 01/30/2017 at 12:09 PM  Between 7am to 6pm - Pager - [phone]  After 6pm go to [email or web address] - password [email or web address] [facility]  [facility] [place] [facility]  Office  [phone]  CC: Primary care physician; Candie ChromanBarzin, Amir Homayoun, DO  Note: This dictation was prepared with Dragon dictation along with smaller phrase technology. Any transcriptional errors that result from this process are unintentional.

## 2017-01-31 LAB — BASIC METABOLIC PANEL
ANION GAP: 7 (ref 5–15)
BUN: 16 mg/dL (ref 6–20)
CALCIUM: 8.3 mg/dL — AB (ref 8.9–10.3)
CO2: 27 mmol/L (ref 22–32)
Chloride: 101 mmol/L (ref 101–111)
Creatinine, Ser: 0.61 mg/dL (ref 0.61–1.24)
GFR calc Af Amer: >60 mL/min (ref >60)
GLUCOSE: 96 mg/dL (ref 65–99)
Potassium: 3.9 mmol/L (ref 3.5–5.1)
SODIUM: 135 mmol/L (ref 135–145)

## 2017-01-31 LAB — GLUCOSE, CAPILLARY
Glucose-Capillary: 116 mg/dL — ABNORMAL HIGH (ref 65–99)
Glucose-Capillary: 163 mg/dL — ABNORMAL HIGH (ref 65–99)
Glucose-Capillary: 134 mg/dL — ABNORMAL HIGH (ref 65–99)
Glucose-Capillary: 177 mg/dL — ABNORMAL HIGH (ref 65–99)

## 2017-01-31 LAB — MAGNESIUM: MAGNESIUM: 2.1 mg/dL (ref 1.7–2.4)

## 2017-01-31 LAB — PHOSPHORUS: Phosphorus: 2.9 mg/dL (ref 2.5–4.6)

## 2017-01-31 MED ORDER — MELOXICAM 7.5 MG PO TABS
7.5000 mg | ORAL_TABLET | Freq: Two times a day (BID) | ORAL | Status: DC | PRN
  Administered 2017-01-31 – 2017-02-10 (×15): 7.5 mg via ORAL
  Filled 2017-01-31 (×17): qty 1

## 2017-01-31 NOTE — Care Management (Addendum)
Patient very firmly is declining home health nurse.  CM was not able to persuade patient after much conversation.  He says he has been without electricity for one month and at presents owes over 700 dollars.  Discussed the inability to reach his son and informed patient to have his son call CM or SW ASAP.  It is documented that patient's son lives with patient but patient says "he's there sometime." Patient is alone most of the time.

## 2017-01-31 NOTE — Care Management (Signed)
Attending is intending to discharge patient home today but asks that physical therapy revaluated as he lives alone and only able to ambulate 20 feet.  He confirms that he has no electricity due to nonpayment and spoke with Child psychotherapistsocial worker about this yesterday.  Says his son   Kristopher Powell [phone] will transport home.  Discussed that portable oxygen tank will need to be brought for transport home.  Patient has had 7 admissions in past 6 months.  He is declining home health.  Have reached out to son to discuss care concerns and social issues. His voicemail box was full.  Sent a text.

## 2017-01-31 NOTE — Progress Notes (Signed)
[facility] - [place] at [facility]   PATIENT NAME: Kristopher SawyersHarmon Powell    MR#:  [id number]  DATE OF BIRTH:  03/26/1956  SUBJECTIVE:  CHIEF COMPLAINT:   Chief Complaint  Patient presents with  . Respiratory Distress   Feels weak. Coughing Eating well Poor historian  REVIEW OF SYSTEMS:    Review of Systems  Constitutional: Positive for malaise/fatigue. Negative for chills and fever.  HENT: Negative for sore throat.   Eyes: Negative for blurred vision, double vision and pain.  Respiratory: Positive for cough, shortness of breath and wheezing. Negative for hemoptysis.   Cardiovascular: Negative for chest pain, palpitations, orthopnea and leg swelling.  Gastrointestinal: Negative for abdominal pain, constipation, diarrhea, heartburn, nausea and vomiting.  Genitourinary: Negative for dysuria and hematuria.  Musculoskeletal: Negative for back pain and joint pain.  Skin: Negative for rash.  Neurological: Positive for weakness. Negative for sensory change, speech change, focal weakness and headaches.  Endo/Heme/Allergies: Does not bruise/bleed easily.  Psychiatric/Behavioral: Negative for depression. The patient is nervous/anxious.     DRUG ALLERGIES:  No Known Allergies  VITALS:  Blood pressure (!) 147/87, pulse 94, temperature 97.7 F (36.5 C), temperature source Oral, resp. rate 18, height 6' (1.829 m), weight 77.1 kg (170 lb), SpO2 96 %.  PHYSICAL EXAMINATION:   Physical Exam  GENERAL:  61 y.o.-year-old patient lying in the bed with no acute distress.  EYES: Pupils equal, round, reactive to light and accommodation. No scleral icterus. Extraocular muscles intact.  HEENT: Head atraumatic, normocephalic. Oropharynx and nasopharynx clear.  NECK:  Supple, no jugular venous distention. No thyroid enlargement, no tenderness.  LUNGS: Decreased air entry with bilateral expiratory wheezing CARDIOVASCULAR: S1, S2 normal. No murmurs, rubs, or gallops.  ABDOMEN: Soft,  nontender, nondistended. Bowel sounds present. No organomegaly or mass.  EXTREMITIES: No cyanosis, clubbing or edema b/l.    NEUROLOGIC: Cranial nerves II through XII are intact. No focal Motor or sensory deficits b/l.   PSYCHIATRIC: The patient is alert and awake. Anxious SKIN: No obvious rash, lesion, or ulcer.   LABORATORY PANEL:   CBC  Recent Labs Lab 01/29/17 0411  WBC 15.8*  HGB 11.7*  HCT 34.1*  PLT 409   ------------------------------------------------------------------------------------------------------------------ Chemistries   Recent Labs Lab 01/28/17 1201  01/31/17 0448  NA 126*  < > 135  K 3.4*  < > 3.9  CL 95*  < > 101  CO2 22  < > 27  GLUCOSE 129*  < > 96  BUN 9  < > 16  CREATININE 0.66  < > 0.61  CALCIUM 9.1  < > 8.3*  MG  --   --  2.1  AST 26  --   --   ALT 11*  --   --   ALKPHOS 108  --   --   BILITOT 0.9  --   --   < > = values in this interval not displayed. ------------------------------------------------------------------------------------------------------------------  Cardiac Enzymes  Recent Labs Lab 01/28/17 1201  TROPONINI <0.03   ------------------------------------------------------------------------------------------------------------------  RADIOLOGY:  No results found.   ASSESSMENT AND PLAN:    * Acute COPD exacerbation with acute on chronic respiratory failure due to ongoing tobacco use Now on prednisone and nebs. No O2 need at rest. Needs 2 L on ambulation  * Chronic mild hyponatremia due to pulmonary issues. Is stable. He is also supposed to be on fluid restriction. Recent nephrology notes. Order was entered.  * h/o Diabetes mellitus Or hyperglycemia due to steroids. Recent hemoglobin  A1c is 5.5.  * Hypertension. Continue home medications of Toprol and Norvasc.  * Tobacco abuse Counseled patient to quit On admission  * DVT prophylaxis with Lovenox  Unsafe home environment. APS to evaluate tomorrow.  All  the records are reviewed and case discussed with Care Management/Social Worker Management plans discussed with the patient, family and they are in agreement.  CODE STATUS: Partial code  DVT Prophylaxis: SCDs  TOTAL TIME TAKING CARE OF THIS PATIENT: 30 minutes.   POSSIBLE D/C IN 1-2 DAYS, DEPENDING ON CLINICAL CONDITION.  Milagros Loll R M.D on 01/31/2017 at 5:51 PM  Between 7am to 6pm - Pager - [phone]  After 6pm go to [email or web address] - password [email or web address] [facility]  [facility] [place] [facility]  Office  [phone]  CC: Primary care physician; Candie Chroman, DO  Note: This dictation was prepared with Dragon dictation along with smaller phrase technology. Any transcriptional errors that result from this process are unintentional.

## 2017-01-31 NOTE — Care Management (Addendum)
Spoke with patient prior to discharge today. He states he has not had electricity for a month. He states he cannot pay for electricity. He states his PCP has assisted him with a letter to [employer], [place]  [phone]. I spoke with [employer] and it is listed under his son Quillian Quince. It is under some kind of investigation as of 01/30/17 per [employer]- she spoke with her supervisor. Supervisor Sharyn Lull with [employer] explained that "there's not a lot we can do with this case. At this point the only way for the service is for Quillian Quince to take care of this outstanding bill". Cost is $1360.71 plus security deposit of $150. Phone number to make arrangement with [employer]: Arletha Pili [phone]. This RNCM explained that patient is discharging to home today and will require continuous home O2. Patient has been getting e-tanks from [employer] to supply his Oxygen. He states he does have O2 tanks at home. I spoke with Shanon Brow at [facility] and he was last delivered O2 tanks on 09/20/16.He has an M tank too. Patient still refusing Home health services. He has been trying to contact Quillian Quince to come pick him up but no answer. CSW updated.  RNCM spoke with APS Breanna to notify of patient discharge to home today. Breanna asked that the discharge be stopped and see will see patient in the hospital tomorrow. RNCM left message with [place] to see if there was any way they could assist patient if he returns to his son's home. I have notified floor that discharge will need to be stopped. I have paged Dr. Darvin Neighbours and I have met with patient. Patient did not have his Oxygen on when I entered; per nurse he's okay at rest but with ambulation his O2 sats drops. Order received to discontinue discharge order per verbal order from Dr. Darvin Neighbours.

## 2017-02-01 ENCOUNTER — Inpatient Hospital Stay: Payer: Medicaid Other

## 2017-02-01 DIAGNOSIS — J449 Chronic obstructive pulmonary disease, unspecified: Secondary | ICD-10-CM

## 2017-02-01 DIAGNOSIS — J189 Pneumonia, unspecified organism: Secondary | ICD-10-CM

## 2017-02-01 DIAGNOSIS — J9621 Acute and chronic respiratory failure with hypoxia: Secondary | ICD-10-CM

## 2017-02-01 LAB — CBC WITH DIFFERENTIAL/PLATELET
BAND NEUTROPHILS: 6 %
BASOS PCT: 0 %
Basophils Absolute: 0 10*3/uL (ref 0–0.1)
Blasts: 0 %
EOS ABS: 0.2 10*3/uL (ref 0–0.7)
EOS PCT: 1 %
HCT: 34.4 % — ABNORMAL LOW (ref 40.0–52.0)
HEMOGLOBIN: 11.7 g/dL — AB (ref 13.0–18.0)
LYMPHS ABS: 2.4 10*3/uL (ref 1.0–3.6)
LYMPHS PCT: 14 %
MCH: 28.2 pg (ref 26.0–34.0)
MCHC: 34 g/dL (ref 32.0–36.0)
MCV: 82.9 fL (ref 80.0–100.0)
MONO ABS: 2.1 10*3/uL — AB (ref 0.2–1.0)
MONOS PCT: 12 %
Metamyelocytes Relative: 0 %
Myelocytes: 0 %
NEUTROS ABS: 12.6 10*3/uL — AB (ref 1.4–6.5)
NEUTROS PCT: 67 %
NRBC: 0 /100{WBCs}
OTHER: 0 %
PROMYELOCYTES ABS: 0 %
Platelets: 359 10*3/uL (ref 150–440)
RBC: 4.15 MIL/uL — ABNORMAL LOW (ref 4.40–5.90)
RDW: 17.3 % — AB (ref 11.5–14.5)
WBC: 17.3 10*3/uL — ABNORMAL HIGH (ref 3.8–10.6)

## 2017-02-01 LAB — BLOOD GAS, ARTERIAL
Acid-base deficit: 1.2 mmol/L (ref 0.0–2.0)
Bicarbonate: 22 mmol/L (ref 20.0–28.0)
FIO2: 0.32
O2 Saturation: 89 %
PCO2 ART: 31 mmHg — AB (ref 32.0–48.0)
PH ART: 7.45 (ref 7.350–7.450)
PO2 ART: 56 mmHg — AB (ref 83.0–108.0)
Patient temperature: 37.9

## 2017-02-01 LAB — COMPREHENSIVE METABOLIC PANEL
ALK PHOS: 84 U/L (ref 38–126)
ALT: 84 U/L — ABNORMAL HIGH (ref 17–63)
ANION GAP: 10 (ref 5–15)
AST: 78 U/L — ABNORMAL HIGH (ref 15–41)
Albumin: 3.2 g/dL — ABNORMAL LOW (ref 3.5–5.0)
BUN: 22 mg/dL — ABNORMAL HIGH (ref 6–20)
CALCIUM: 8.4 mg/dL — AB (ref 8.9–10.3)
CO2: 24 mmol/L (ref 22–32)
Chloride: 99 mmol/L — ABNORMAL LOW (ref 101–111)
Creatinine, Ser: 1.43 mg/dL — ABNORMAL HIGH (ref 0.61–1.24)
GFR calc non Af Amer: 51 mL/min — ABNORMAL LOW (ref >60)
GFR, EST AFRICAN AMERICAN: 60 mL/min — AB (ref >60)
Glucose, Bld: 105 mg/dL — ABNORMAL HIGH (ref 65–99)
POTASSIUM: 4 mmol/L (ref 3.5–5.1)
SODIUM: 133 mmol/L — AB (ref 135–145)
TOTAL PROTEIN: 5.7 g/dL — AB (ref 6.5–8.1)
Total Bilirubin: 0.8 mg/dL (ref 0.3–1.2)

## 2017-02-01 LAB — MAGNESIUM: MAGNESIUM: 2.1 mg/dL (ref 1.7–2.4)

## 2017-02-01 LAB — GLUCOSE, CAPILLARY
GLUCOSE-CAPILLARY: 76 mg/dL (ref 65–99)
GLUCOSE-CAPILLARY: 107 mg/dL — AB (ref 65–99)
GLUCOSE-CAPILLARY: 153 mg/dL — AB (ref 65–99)
Glucose-Capillary: 268 mg/dL — ABNORMAL HIGH (ref 65–99)
Glucose-Capillary: 120 mg/dL — ABNORMAL HIGH (ref 65–99)

## 2017-02-01 LAB — PHOSPHORUS: Phosphorus: 4.1 mg/dL (ref 2.5–4.6)

## 2017-02-01 LAB — LACTIC ACID, PLASMA: Lactic Acid, Venous: 4.7 mmol/L (ref 0.5–1.9)

## 2017-02-01 LAB — TROPONIN I

## 2017-02-01 MED ORDER — BENZONATATE 100 MG PO CAPS
ORAL_CAPSULE | ORAL | Status: AC
  Filled 2017-02-01: qty 1

## 2017-02-01 MED ORDER — SODIUM CHLORIDE 0.9 % IV BOLUS (SEPSIS)
1000.0000 mL | Freq: Once | INTRAVENOUS | Status: AC
  Administered 2017-02-01: 1000 mL via INTRAVENOUS

## 2017-02-01 MED ORDER — IPRATROPIUM-ALBUTEROL 0.5-2.5 (3) MG/3ML IN SOLN
3.0000 mL | RESPIRATORY_TRACT | Status: DC | PRN
  Administered 2017-02-01: 22:00:00 3 mL via RESPIRATORY_TRACT

## 2017-02-01 MED ORDER — PREDNISONE 20 MG PO TABS: 30.0000 mg | ORAL_TABLET | Freq: Every day | ORAL | Status: DC

## 2017-02-01 MED ORDER — SODIUM CHLORIDE 0.9 % IV SOLN
0.0000 ug/min | INTRAVENOUS | Status: DC
  Administered 2017-02-02 (×2): 100 ug/min via INTRAVENOUS
  Administered 2017-02-02: 30 ug/min via INTRAVENOUS
  Filled 2017-02-01: qty 10
  Filled 2017-02-01: qty 1
  Filled 2017-02-01: qty 10

## 2017-02-01 MED ORDER — PREDNISONE 10 MG PO TABS: 10.0000 mg | ORAL_TABLET | Freq: Every day | ORAL | Status: DC

## 2017-02-01 MED ORDER — PIPERACILLIN-TAZOBACTAM 3.375 G IVPB
3.3750 g | Freq: Three times a day (TID) | INTRAVENOUS | Status: DC
  Administered 2017-02-01: 3.375 g via INTRAVENOUS
  Filled 2017-02-01 (×3): qty 50

## 2017-02-01 MED ORDER — BENZONATATE 100 MG PO CAPS
100.0000 mg | ORAL_CAPSULE | Freq: Three times a day (TID) | ORAL | Status: DC | PRN
  Administered 2017-02-01 – 2017-02-11 (×6): 100 mg via ORAL
  Filled 2017-02-01 (×6): qty 1

## 2017-02-01 MED ORDER — PREDNISONE 20 MG PO TABS: 40.0000 mg | ORAL_TABLET | Freq: Every day | ORAL | Status: DC

## 2017-02-01 MED ORDER — VANCOMYCIN HCL IN DEXTROSE 1-5 GM/200ML-% IV SOLN
1000.0000 mg | Freq: Once | INTRAVENOUS | Status: AC
  Administered 2017-02-01: 22:00:00 1000 mg via INTRAVENOUS
  Filled 2017-02-01: qty 200

## 2017-02-01 MED ORDER — PREDNISONE 20 MG PO TABS: 20.0000 mg | ORAL_TABLET | Freq: Every day | ORAL | Status: DC

## 2017-02-01 MED ORDER — VANCOMYCIN HCL IN DEXTROSE 1-5 GM/200ML-% IV SOLN
1000.0000 mg | Freq: Three times a day (TID) | INTRAVENOUS | Status: DC
  Administered 2017-02-02: 1000 mg via INTRAVENOUS
  Filled 2017-02-01 (×2): qty 200

## 2017-02-01 NOTE — Consult Note (Signed)
Name: Kristopher ReeveHarmon L Powell MRN: [id number] DOB: 06/17/1956    ADMISSION DATE:  01/28/2017  CONSULTATION DATE: 6/19  REFERRING MD :  Dr. Anne HahnWillis  CHIEF COMPLAINT: Altered mental status, Hypoxia  BRIEF PATIENT DESCRIPTION: 61 year old male initially admitted with COPD.  Now transferred from the floor due to altered mental status and hypoxia  due to Pneumonia  SIGNIFICANT EVENTS  6/15 Patient admitted with COPD Exacerbation 6/19 Patient transferred from the floor due to altered , mental status and hypoxia due to PNA  STUDIES:  none   HISTORY OF PRESENT ILLNESS:  Kristopher SawyersHarmon Powell is a 61 year old male with known history of CHF, COPD,Tobacco abuse, DM, Hypertension and MI.  Patient was initially admitted on 6/15 with COPD exacerbation.  Apparently patient was doing well up until this evening but  was noted to be more drowsy with Altered Mental status and hypoxia. CXR was concerning for aspiration PNA. WBC's are elevated.  Patient was transferred to the ICU for closer monitoring   PAST MEDICAL HISTORY :   has a past medical history of CHF (congestive heart failure) (HCC); COPD (chronic obstructive pulmonary disease) (HCC); Diabetes mellitus without complication (HCC); Hypertension; and MI (myocardial infarction) (HCC) (2006).  has a past surgical history that includes Coronary angioplasty with stent; Appendectomy; and Cholecystectomy. Prior to Admission medications   Medication Sig Start Date End Date Taking? Authorizing Provider  albuterol (PROVENTIL HFA;VENTOLIN HFA) 108 (90 Base) MCG/ACT inhaler Inhale 2 puffs into the lungs every 6 (six) hours as needed for wheezing or shortness of breath.   Yes [provider]  amLODipine (NORVASC) 5 MG tablet Take 5 mg by mouth daily.   Yes [provider]  aspirin EC 325 MG tablet Take 325 mg by mouth daily.   Yes [provider]  esomeprazole (NEXIUM) 40 MG capsule Take 40 mg by mouth daily.   Yes [provider]    Fluticasone-Salmeterol (ADVAIR) 100-50 MCG/DOSE AEPB Inhale 1 puff into the lungs 2 (two) times daily. 01/16/17  Yes Shaune Pollackhen, Qing, MD  gabapentin (NEURONTIN) 300 MG capsule Take 1,200 mg by mouth 3 (three) times daily.    Yes [provider]  imipramine (TOFRANIL) 50 MG tablet Take 200 mg by mouth at bedtime.   Yes [provider]  ipratropium-albuterol (DUONEB) 0.5-2.5 (3) MG/3ML SOLN Take 3 mLs by nebulization every 6 (six) hours. 08/19/16  Yes Enid BaasKalisetti, Radhika, MD  metoprolol succinate (TOPROL XL) 25 MG 24 hr tablet Take 25 mg by mouth daily.   Yes [provider]  risperiDONE (RISPERDAL) 2 MG tablet Take 4 mg by mouth at bedtime.   Yes [provider]  diazepam (VALIUM) 10 MG tablet Take 0.5 tablets (5 mg total) by mouth every 12 (twelve) hours as needed for anxiety. 01/30/17   Milagros LollSudini, Srikar, MD  predniSONE (DELTASONE) 50 MG tablet Take 1 tablet (50 mg total) by mouth daily with breakfast. 01/30/17   Milagros LollSudini, Srikar, MD   No Known Allergies  FAMILY HISTORY:  family history includes CAD in his mother; Heart attack in his brother; Stroke in his father. SOCIAL HISTORY:  reports that he has been smoking Cigarettes.  He has a 40.00 pack-year smoking history. He has never used smokeless tobacco. He reports that he drinks about 7.2 - 14.4 oz of alcohol per week . He reports that he does not use drugs.  REVIEW OF SYSTEMS:   Constitutional: Negative for fever, chills, weight loss, malaise/fatigue and diaphoresis.  HENT: Negative for hearing  loss, ear pain, nosebleeds, congestion, sore throat, neck pain, tinnitus and ear discharge.   Eyes: Negative for blurred vision, double vision, photophobia, pain, discharge and redness.  Respiratory: Negative for cough, hemoptysis, sputum production, shortness of breath, wheezing and stridor.   Cardiovascular: Negative for chest pain, palpitations, orthopnea, claudication, leg swelling and PND.  Gastrointestinal: Negative for  heartburn, nausea, vomiting, abdominal pain, diarrhea, constipation, blood in stool and melena.  Genitourinary: Negative for dysuria, urgency, frequency, hematuria and flank pain.  Musculoskeletal: Negative for myalgias, back pain, joint pain and falls.  Skin: Negative for itching and rash.  Neurological: Negative for dizziness, tingling, tremors, sensory change, speech change, focal weakness, seizures, loss of consciousness, weakness and headaches.  Endo/Heme/Allergies: Negative for environmental allergies and polydipsia. Does not bruise/bleed easily.  SUBJECTIVE: Unable to obtain  VITAL SIGNS: Temp:  [97.7 F (36.5 C)-102.9 F (39.4 C)] 102.9 F (39.4 C) (06/19 2203) Pulse Rate:  [79-117] 117 (06/19 2222) Resp:  [14-24] 24 (06/19 2039) BP: (56-144)/(37-81) 68/40 (06/19 2238) SpO2:  [91 %-98 %] 94 % (06/19 2222) FiO2 (%):  [28 %] 28 % (06/19 0752) Weight:  [77.7 kg (171 lb 4.8 oz)] 77.7 kg (171 lb 4.8 oz) (06/19 0323)  PHYSICAL EXAMINATION: General:  61 year old male, on 6L, in no acute distress Neuro:  Opens eyes to voice HEENT:  AT,[place],No JVD, Cardiovascular:  Tachycardic,s1s2,nom/r/g Lungs: coarse throughout Left>right, no wheezes,crackles,rhonchi Abdomen:  Soft,NT,ND Musculoskeletal:  No edema,cyanosis  Skin:  Warm,dry and intact   Recent Labs Lab 01/28/17 1201 01/29/17 0411 01/31/17 0448  NA 126* 132* 135  K 3.4* 4.1 3.9  CL 95* 104 101  CO2 22 24 27   BUN 9 12 16   CREATININE 0.66 0.65 0.61  GLUCOSE 129* 165* 96    Recent Labs Lab 01/28/17 1201 01/29/17 0411 02/01/17 2151  HGB 13.3 11.7* 11.7*  HCT 37.9* 34.1* 34.4*  WBC 13.0* 15.8* 17.3*  PLT 416 409 359   Dg Chest Port 1 View  Result Date: 02/01/2017 CLINICAL DATA:  Cough. EXAM: PORTABLE CHEST 1 VIEW COMPARISON:  Radiographs of January 28, 2017. FINDINGS: Stable cardiomediastinal silhouette. No pneumothorax is noted. Old left rib fractures are noted. There is interval development of patchy opacities  throughout the left lung most consistent with pneumonia. New focal opacity is noted in right midlung most consistent with pneumonia. No significant pleural effusion is noted. IMPRESSION: Interval development of patchy opacities throughout the left lung as well as focal opacity in right midlung most consistent with pneumonia. Followup PA and lateral chest X-ray is recommended in 3-4 weeks following trial of antibiotic therapy to ensure resolution and exclude underlying malignancy. Electronically Signed   By: Lupita Raider, M.D.   On: 02/01/2017 21:37    ASSESSMENT / PLAN:  Acute on Chronic respiratory failure secondary  Aspiration PNA Septic shock COPD Hx of CHF Tobacco Abuse Hyponatremia Acute Kidney Injury Lactic acidosis Leukocytosis secondary to PNA   Plan Use Bipap,wean as tolerated Keep O2 sats>92% Patient is a Limited code, Do Not Intubate Received bolus 2liters Will start on neosynephrine to Keep MAP GOAL>65 Will switch to Ampicillin Salbactum Bronchodilators Continue Steroids, taper Monitor fever,CBC On  1200 ml fluid restriction for hyponatremia per nephrology-01/24/17 Trend Lactic acid, follow PCT Follow Cultures Strict Intake/output Follow CBC,BMET   Bincy Varughese,AG-ACNP Pulmonary and Critical Care Medicine [facility]   02/01/2017, 10:43 PM   PCCM ATTENDING ATTESTATION:  I have evaluated patient with the APP Varughese, reviewed database in its entirety and discussed  care plan in detail. In addition, this patient was discussed on multidisciplinary rounds.   Important exam findings: Cognition intact Disheveled appearing Markedly diminished BS on L No wheezes Reg, no M NABS, soft No edema  CXR: L sided infiltrate c/w PNA  Major problems addressed by PCCM team: Severe COPD CAP, NOS Acute on chronic respiratory failure MRSA PCR+   PLAN/REC: Cont PRN BiPAP Cont supplemental O2 Cont pred taper  Cont nebulized bronchodilators Add  nebulized steroids Cont Unasyn Add Vanc  Billy Fischer, MD PCCM service Mobile [phone] Pager [phone] 02/02/2017 3:09 PM

## 2017-02-01 NOTE — Care Management (Signed)
Spoke with Kaleen OdeaBreanna at the [facility]. [phone]). States that she did visit [name] this morning. States that he could back to his home, but there isn't anyone to help him in the home. Discussed short term rehabilitation with [name] and he is in agreement with this plan States he lives alone and would like to go to a short term facility if possible. Gwenette GreetBrenda S Khaya Theissen RN MSN CCM Care Management [phone]

## 2017-02-01 NOTE — Progress Notes (Signed)
CH received a page for rapid response. CH responded to Rm 105A where the rapid response team was actively working on PT. [facility] staff and prayer for the PT. CH left Rm after team left.    02/01/17 2120  Clinical Encounter Type  Visited With Patient  Visit Type Code  Referral From Nurse  Consult/Referral To Chaplain  Spiritual Encounters  Spiritual Needs Prayer

## 2017-02-01 NOTE — Progress Notes (Signed)
Noted patient was unresponsive and with increase respirations, decreased oxygen,  elevated temperature, and hypotensive.  Rapid  Response called, MD notified, MD to unit, orders placed by Dr. Anne HahnWillis. 1 liter bolus given and labs ordered, chest x-ray ordered per MD. Patient Transferred to CCU.  Attempt to notified patients son Natale Lay( Daniel Limones) x 2, no answer, not able to leave message due to mail box not set up to receive message and mail box is full on second phone number.

## 2017-02-01 NOTE — [place] FL2 (Signed)
[place] MEDICAID FL2 LEVEL OF CARE SCREENING TOOL     IDENTIFICATION  Patient Name: Kristopher Powell Birthdate: 04-09-1956 Sex: male Admission Date (Current Location): 01/28/2017  [place] and [place] Number:  Chiropodist and Address:  [facility], [address] [place], [place] [address]      Provider Number: [id number]  Attending Physician Name and Address:  Enedina Finner, MD  Relative Name and Phone Number:       Current Level of Care: Hospital Recommended Level of Care:  Nursing Facility Prior Approval Number:    Date Approved/Denied:   PASRR Number:   [id number]  Discharge Plan: SNF    Current Diagnoses: Patient Active Problem List   Diagnosis Date Noted  . COPD exacerbation (HCC) 01/28/2017  . 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 Tardive dyskinesia 01/24/2017  . SIRS (systemic inflammatory response syndrome) (HCC) 09/05/2016  . Acute URI 09/05/2016  . Acute respiratory failure (HCC) 09/05/2016  . Acute on chronic respiratory failure with hypoxia and hypercapnia (HCC) 08/14/2016  . Chest pain 08/07/2016  . Dehydration with hyponatremia 08/06/2016  . Chest pain, rule out acute myocardial infarction 08/06/2016  . CHF (congestive heart failure) (HCC)   . Generalized anxiety disorder 08/14/2015  . Benzodiazepine abuse 08/14/2015  . Acute delirium 08/14/2015  . Diabetes (HCC) 08/14/2015  . COPD (chronic obstructive pulmonary disease) (HCC) 08/14/2015  . Suicidal ideation 08/14/2015  . Hyponatremia 05/23/2015  . Hypokalemia 05/23/2015    Orientation RESPIRATION BLADDER Height & Weight     Self, Time, Situation, Place  O2 (4L) Continent Weight: 171 lb 4.8 oz (77.7 kg) Height:  6' (182.9 cm)  BEHAVIORAL SYMPTOMS/MOOD NEUROLOGICAL BOWEL NUTRITION STATUS      Continent Diet (regular)  AMBULATORY STATUS COMMUNICATION OF NEEDS Skin   Limited Assist (49ft) Verbally Normal                       Personal Care Assistance  Level of Assistance  Bathing, Feeding, Dressing Bathing Assistance: Limited assistance Feeding assistance: Independent Dressing Assistance: Limited assistance     Functional Limitations Info  Sight, Hearing, Speech Sight Info: Adequate Hearing Info: Adequate Speech Info: Adequate    SPECIAL CARE FACTORS FREQUENCY                       Contractures Contractures Info: Not present    Additional Factors Info  Code Status, Allergies Code Status Info: Partial Allergies Info: NKA           Current Medications (02/01/2017):  This is the current hospital active medication list Current Facility-Administered Medications  Medication Dose Route Frequency Provider Last Rate Last Dose  . acetaminophen (TYLENOL) tablet 650 mg  650 mg Oral Q6H PRN Milagros Loll, MD   650 mg at 02/01/17 0945   Or  . acetaminophen (TYLENOL) suppository 650 mg  650 mg Rectal Q6H PRN Sudini, Wardell Heath, MD      . albuterol (PROVENTIL) (2.5 MG/3ML) 0.083% nebulizer solution 2.5 mg  2.5 mg Nebulization Q2H PRN Sudini, Wardell Heath, MD      . aspirin EC tablet 325 mg  325 mg Oral Daily Milagros Loll, MD   325 mg at 02/01/17 0945  . benzonatate (TESSALON) capsule 100 mg  100 mg Oral TID PRN Enedina Finner, MD      . diazepam (VALIUM) tablet 5 mg  5 mg Oral TID PRN Milagros Loll, MD   5 mg at 02/01/17 1101  . enoxaparin (  LOVENOX) injection 40 mg  40 mg Subcutaneous Q24H Milagros LollSudini, Srikar, MD   40 mg at 01/31/17 2112  . gabapentin (NEURONTIN) capsule 1,200 mg  1,200 mg Oral TID Milagros LollSudini, Srikar, MD   1,200 mg at 02/01/17 0944  . imipramine (TOFRANIL) tablet 200 mg  200 mg Oral QHS Milagros LollSudini, Srikar, MD   200 mg at 01/31/17 2111  . insulin aspart (novoLOG) injection 0-15 Units  0-15 Units Subcutaneous TID WC Milagros LollSudini, Srikar, MD   8 Units at 02/01/17 1220  . insulin aspart (novoLOG) injection 0-5 Units  0-5 Units Subcutaneous QHS Sudini, Srikar, MD      . ipratropium-albuterol (DUONEB) 0.5-2.5 (3) MG/3ML nebulizer solution 3 mL  3  mL Nebulization QID Milagros LollSudini, Srikar, MD   3 mL at 02/01/17 1119  . meloxicam (MOBIC) tablet 7.5 mg  7.5 mg Oral BID PRN Arnaldo Nataliamond, Michael S, MD   7.5 mg at 02/01/17 0754  . metoprolol succinate (TOPROL-XL) 24 hr tablet 25 mg  25 mg Oral Daily Milagros LollSudini, Srikar, MD   25 mg at 02/01/17 1049  . mometasone-formoterol (DULERA) 100-5 MCG/ACT inhaler 2 puff  2 puff Inhalation BID Milagros LollSudini, Srikar, MD   2 puff at 02/01/17 1050  . ondansetron (ZOFRAN) tablet 4 mg  4 mg Oral Q6H PRN Milagros LollSudini, Srikar, MD       Or  . ondansetron (ZOFRAN) injection 4 mg  4 mg Intravenous Q6H PRN Sudini, Srikar, MD      . pantoprazole (PROTONIX) EC tablet 40 mg  40 mg Oral Daily Milagros LollSudini, Srikar, MD   40 mg at 02/01/17 0945  . polyethylene glycol (MIRALAX / GLYCOLAX) packet 17 g  17 g Oral Daily PRN Milagros LollSudini, Srikar, MD      . Melene Muller[START ON 02/02/2017] predniSONE (DELTASONE) tablet 40 mg  40 mg Oral Q breakfast Enedina FinnerPatel, Sona, MD       Followed by  . [START ON 02/03/2017] predniSONE (DELTASONE) tablet 30 mg  30 mg Oral Q breakfast Enedina FinnerPatel, Sona, MD       Followed by  . [START ON 02/04/2017] predniSONE (DELTASONE) tablet 20 mg  20 mg Oral Q breakfast Enedina FinnerPatel, Sona, MD       Followed by  . [START ON 02/05/2017] predniSONE (DELTASONE) tablet 10 mg  10 mg Oral Q breakfast Enedina FinnerPatel, Sona, MD      . risperiDONE (RISPERDAL) tablet 4 mg  4 mg Oral QHS Milagros LollSudini, Srikar, MD   4 mg at 01/31/17 2112  . sodium chloride flush (NS) 0.9 % injection 3 mL  3 mL Intravenous Q12H Sudini, Srikar, MD   3 mL at 02/01/17 1049  . sodium chloride flush (NS) 0.9 % injection 3 mL  3 mL Intravenous PRN Milagros LollSudini, Srikar, MD         Discharge Medications: Please see discharge summary for a list of discharge medications.  Relevant Imaging Results:  Relevant Lab Results:   Additional Information SSN:  [id number]     Raye SorrowCoble, Arie Powell N, [place]

## 2017-02-01 NOTE — Progress Notes (Signed)
Physical Therapy Treatment Patient Details Name: Emeterio ReeveHarmon L Catarino MRN: [id number] DOB: 07/23/1956 Today's Date: 02/01/2017    History of Present Illness Pt is a 61 yo M, admitted to acute care with COPD exacerbation on 01/28/17. Prior to admission pt independent with ADL's and utilized RW for navigation within home/community. Per pt report, he utilizes 4.5L supplemental O2 24/7, but has not been able to recently, secondary to no electricity within the home. PMH includes the following: tobacco use, COPD, chronic respiratory failure on 4L O2, neuropathy, GERD, depression, CHF, diabetes, HTN, and MI.     PT Comments    Per discussion with care manager, patient will not be able to have 24 hour sup/assist upon discharge.  As documented in previous notes, patient currently requiring cga/min assist for all gait efforts; unsafe to attempt without hands-on assist at all times.  As result, discharge recommendations updated to reflect need for STR upon discharge.  Will continue to monitor throughout remaining hospitalization.    Follow Up Recommendations  SNF     Equipment Recommendations  Rolling walker with 5" wheels     Frequency    Min 2X/week       End of Session         PT Visit Diagnosis: Muscle weakness (generalized) (M62.81);Difficulty in walking, not elsewhere classified (R26.2)      [place] H. Manson PasseyBrown, PT, DPT, NCS 02/01/17, 3:00 PM [phone]

## 2017-02-01 NOTE — Progress Notes (Signed)
[facility] [facility] -  at [facility]   PATIENT NAME: Mccall Lomax    MR#:  [id number]  DATE OF BIRTH:  1956-01-11  SUBJECTIVE:   Still has shortness of breath. On room air today. Needing oxygen on ambulation. Feels weak  REVIEW OF SYSTEMS:    Review of Systems  Constitutional: Positive for malaise/fatigue. Negative for chills and fever.  HENT: Negative for sore throat.   Eyes: Negative for blurred vision, double vision and pain.  Respiratory: Positive for cough, shortness of breath and wheezing. Negative for hemoptysis.   Cardiovascular: Negative for chest pain, palpitations, orthopnea and leg swelling.  Gastrointestinal: Negative for abdominal pain, constipation, diarrhea, heartburn, nausea and vomiting.  Genitourinary: Negative for dysuria and hematuria.  Musculoskeletal: Negative for back pain and joint pain.  Skin: Negative for rash.  Neurological: Positive for weakness. Negative for sensory change, speech change, focal weakness and headaches.  Endo/Heme/Allergies: Does not bruise/bleed easily.  Psychiatric/Behavioral: Negative for depression. The patient is nervous/anxious.     DRUG ALLERGIES:  No Known Allergies  VITALS:  Blood pressure (!) 144/81, pulse 88, temperature 97.7 F (36.5 C), resp. rate 14, height 6' (1.829 m), weight 77.7 kg (171 lb 4.8 oz), SpO2 94 %.  PHYSICAL EXAMINATION:   Physical Exam  GENERAL:  61 y.o.-year-old patient lying in the bed with no acute distress.  EYES: Pupils equal, round, reactive to light and accommodation. No scleral icterus. Extraocular muscles intact.  HEENT: Head atraumatic, normocephalic. Oropharynx and nasopharynx clear.  NECK:  Supple, no jugular venous distention. No thyroid enlargement, no tenderness.  LUNGS: Decreased air entry with bilateral expiratory wheezing CARDIOVASCULAR: S1, S2 normal. No murmurs, rubs, or gallops.  ABDOMEN: Soft, nontender, nondistended. Bowel sounds present. No organomegaly or  mass.  EXTREMITIES: No cyanosis, clubbing or edema b/l.    NEUROLOGIC: Cranial nerves II through XII are intact. No focal Motor or sensory deficits b/l.   PSYCHIATRIC: The patient is alert and awake. Anxious SKIN: No obvious rash, lesion, or ulcer.  Tattoos ++  LABORATORY PANEL:   CBC  Recent Labs Lab 01/29/17 0411  WBC 15.8*  HGB 11.7*  HCT 34.1*  PLT 409   ------------------------------------------------------------------------------------------------------------------ Chemistries   Recent Labs Lab 01/28/17 1201  01/31/17 0448 02/01/17 0409  NA 126*  < > 135  --   K 3.4*  < > 3.9  --   CL 95*  < > 101  --   CO2 22  < > 27  --   GLUCOSE 129*  < > 96  --   BUN 9  < > 16  --   CREATININE 0.66  < > 0.61  --   CALCIUM 9.1  < > 8.3*  --   MG  --   < > 2.1 2.1  AST 26  --   --   --   ALT 11*  --   --   --   ALKPHOS 108  --   --   --   BILITOT 0.9  --   --   --   < > = values in this interval not displayed. ------------------------------------------------------------------------------------------------------------------  Cardiac Enzymes  Recent Labs Lab 01/28/17 1201  TROPONINI <0.03   ------------------------------------------------------------------------------------------------------------------  RADIOLOGY:  No results found.   ASSESSMENT AND PLAN:  Zein Helbing  is a 61 y.o. male with a known history of COPD, tobacco use, chronic respiratory failure on 4 L oxygen, neuropathy, GERD, depression presents to the emergency room due to worsening shortness of breath.  His saturations were found to be 70% on room air. And 88% on 4 L oxygen. EMS gave him a dose of Solu-Medrol and nebulizer and brought to the emergency room. Here patient has received 2 additional nebulizer treatments  * Acute COPD exacerbation with acute on chronic respiratory failure due to ongoing tobacco use Still has wheezing.  Shortness of breath on ambulation.  -po taper prednisone.  -prn  nebulizer treatment.  * Chronic mild hyponatremia due to pulmonary issues. Is stable. He is also supposed to be on fluid restriction. Recent nephrology notes. Order was entered.  * h/o Diabetes mellitus Or hyperglycemia due to steroids. Recent hemoglobin A1c is 5.5.  * Hypertension. Continue home medications of Toprol and Norvasc.  * Tobacco abuse Counseled patient to quit On admission  * DVT prophylaxis with Lovenox  Pt ready for discharge. Waiting for APS/DSS to come evaluate and help with d/c planning.  All the records are reviewed and case discussed with Care Management/Social Worker Management plans discussed with the patient, family and they are in agreement.  CODE STATUS: Partial code  DVT Prophylaxis: SCDs  TOTAL TIME TAKING CARE OF THIS PATIENT:25 minutes.   POSSIBLE D/C IN 1-2 DAYS, DEPENDING ON CLINICAL CONDITION.  Creta Dorame M.D on 02/01/2017 at 8:45 AM  Between 7am to 6pm - Pager - [phone]  After 6pm go to [email or web address] - password [email or web address] [email or web address]  [facility] [place] [facility]  Office  [phone]  CC: Primary care physician; Candie ChromanBarzin, Amir Homayoun, DO  Note: This dictation was prepared with Dragon dictation along with smaller phrase technology. Any transcriptional errors that result from this process are unintentional.

## 2017-02-01 NOTE — Significant Event (Signed)
Rapid Response Event Note  Overview: Called for rapid response for pt with altered mental status, decreased O2 sats and change in pulse      Initial Focused Assessment: Pt in bed lying on side being cleaned after bowel movement. See flowsheet for vital signs. Dr. Anne HahnWillis at bedside.    Interventions: Dr. Anne HahnWillis ordered ABG, EKG chest xray and labs.   Plan of Care (if not transferred): Awaiting dispo after test results.  Event Summary:   at      at          [facility],Gwendolyn Nishi A

## 2017-02-01 NOTE — Progress Notes (Addendum)
LCSW following for disposition/recommendations.  APS involved due to report of self neglect and inability to care for self. Patient lacking assistance at home and recommendation for APS is long term care. Discussed plans with patient and educated on long term placement with medicaid payer source and patient remain in nursing home for 30 days. Patient aware and in agreement, reports [facility].  Due to 7 admissions in last 6 months, placement to ensure self care and decrease use of hospital and increase stability in community.  Work up completed for long term care as patient has medicaid.  Awaiting bed offers.  Kristopher EmoryHannah Tyne Banta LCSW, MSW Clinical Social Work: Optician, dispensingystem Wide Float Coverage for :  [phone]

## 2017-02-01 NOTE — Progress Notes (Signed)
Called to rapid response. Pt. Lethargic tachypneic. SpO1 91% on O2 via 3LPM Nasal Cannuila. Breath sounds diminished bilaterally with fine crackles in left lung fields. ABG drawn O2 increased to 6LPM with improved SpO2 to 95%. Duoneb SVN administered. Dr Anne HahnWillis at bedside.

## 2017-02-01 NOTE — Progress Notes (Signed)
Physical Therapy Treatment Patient Details Name: Kristopher Powell L Dec MRN: [id number] DOB: 10/04/1955 Today's Date: 02/01/2017    History of Present Illness Pt is a 61 yo M, admitted to acute care with COPD exacerbation on 01/28/17. Prior to admission pt independent with ADL's and utilized RW for navigation within home/community. Per pt report, he utilizes 4.5L supplemental O2 24/7, but has not been able to recently, secondary to no electricity within the home. PMH includes the following: tobacco use, COPD, chronic respiratory failure on 4L O2, neuropathy, GERD, depression, CHF, diabetes, HTN, and MI.     PT Comments    Bed mobility and sitting balance without assist and without dizziness today.  He was able to stand and ambulate 375' with walker and min guard/assist and overall improved gait quality.  Pt without LOB today but stated he felt unsteady and unsafe to walk without assist.  Participated in exercises as described below. Pt limited by fatigue.  SOB noted during activity and encouraged proper breathing to maximize nasal cannula O2 as he mouth breaths most of the time.   Follow Up Recommendations  Home health PT;Supervision for mobility/OOB     Equipment Recommendations  Rolling walker with 5" wheels    Recommendations for Other Services       Precautions / Restrictions Precautions Precautions: Fall Restrictions Weight Bearing Restrictions: No    Mobility  Bed Mobility Overal bed mobility: Modified Independent             General bed mobility comments: no dizziness reported today  Transfers Overall transfer level: Modified independent Equipment used: Rolling walker (2 wheeled)             General transfer comment: no direct assist but generally unsteady  Ambulation/Gait Ambulation/Gait assistance: Min guard Ambulation Distance (Feet): 75 Feet Assistive device: Rolling walker (2 wheeled) Gait Pattern/deviations: Step-through pattern     General Gait Details:  no LOB today but gait generally unsteady and requires +1 assist for safety.  Pt aware of balance deficits and stated he felt like he would have fallen without assist.   Stairs            Wheelchair Mobility    Modified Rankin (Stroke Patients Only)       Balance Overall balance assessment: Needs assistance;History of Falls Sitting-balance support: Feet supported Sitting balance-Leahy Scale: Good Sitting balance - Comments: mod I sitting balance    Standing balance support: Bilateral upper extremity supported Standing balance-Leahy Scale: Fair Standing balance comment: general balance deficits and falls at home                            Cognition Arousal/Alertness: Awake/alert Behavior During Therapy: WFL for tasks assessed/performed Overall Cognitive Status: Within Functional Limits for tasks assessed                                        Exercises Other Exercises Other Exercises: standing exercises with walker for support  - heel raises and marches - limited by fatigue on 2 lpm 02    General Comments        Pertinent Vitals/Pain Pain Assessment: 0-10 Pain Score: 2  Faces Pain Scale: Hurts a little bit Pain Location: Arms/legs  Pain Descriptors / Indicators: Sore Pain Intervention(s): Limited activity within patient's tolerance    Home Living  Prior Function            PT Goals (current goals can now be found in the care plan section) Progress towards PT goals: Progressing toward goals    Frequency    Min 2X/week      PT Plan Current plan remains appropriate    Co-evaluation              AM-PAC PT "6 Clicks" Daily Activity  Outcome Measure  Difficulty turning over in bed (including adjusting bedclothes, sheets and blankets)?: A Little Difficulty moving from lying on back to sitting on the side of the bed? : A Little Difficulty sitting down on and standing up from a chair with  arms (e.g., wheelchair, bedside commode, etc,.)?: Total Help needed moving to and from a bed to chair (including a wheelchair)?: A Little Help needed walking in hospital room?: A Little Help needed climbing 3-5 steps with a railing? : A Lot 6 Click Score: 15    End of Session Equipment Utilized During Treatment: Gait belt;Oxygen Activity Tolerance: Patient tolerated treatment well Patient left: in bed;with call bell/phone within reach;with bed alarm set         Time: 6962-9528 PT Time Calculation (min) (ACUTE ONLY): 19 min  Charges:  $Gait Training: 8-22 mins                    G Codes:       Danielle Dess, PTA 02/01/17, 10:37 AM

## 2017-02-01 NOTE — Progress Notes (Signed)
Pharmacy Antibiotic Note  Kristopher Powell is a 61 y.o. male admitted on 01/28/2017 with pneumonia.  Pharmacy has been consulted for vanc/zosyn dosing.  Plan: Will give patient vanc 1g IV x 1  Will follow up w/ vanc maintenance of vanc 1g IV q12h w/ 6 hour stack dose. Will draw vanc trough 6/21 @ 0300 prior to 4th dose. Will start Zosyn 3.375g IV q8h extended infusion. Ke 0.0927 T1/2 8 hours Css 16 mcg/mL   Height: 6' (182.9 cm) Weight: 171 lb 4.8 oz (77.7 kg) IBW/kg (Calculated) : 77.6  Temp (24hrs), Avg:100.2 F (37.9 C), Min:97.7 F (36.5 C), Max:102.9 F (39.4 C)   Recent Labs Lab 01/28/17 1201 01/29/17 0411 01/31/17 0448 02/01/17 2151  WBC 13.0* 15.8*  --  17.3*  CREATININE 0.66 0.65 0.61  --     Estimated Creatinine Clearance: 106.4 mL/min (by C-G formula based on SCr of 0.61 mg/dL).    No Known Allergies   Thank you for allowing pharmacy to be a part of this patient's care.  Thomasene Rippleavid Kassia Demarinis, PharmD, BCPS Clinical Pharmacist 02/01/2017

## 2017-02-02 DIAGNOSIS — R531 Weakness: Secondary | ICD-10-CM

## 2017-02-02 LAB — VANCOMYCIN, TROUGH: VANCOMYCIN TR: 10 ug/mL — AB (ref 15–20)

## 2017-02-02 LAB — BLOOD CULTURE ID PANEL (REFLEXED)
ACINETOBACTER BAUMANNII: NOT DETECTED
CANDIDA ALBICANS: NOT DETECTED
CANDIDA GLABRATA: NOT DETECTED
CANDIDA TROPICALIS: NOT DETECTED
Candida krusei: NOT DETECTED
Candida parapsilosis: NOT DETECTED
ENTEROBACTER CLOACAE COMPLEX: NOT DETECTED
ENTEROBACTERIACEAE SPECIES: NOT DETECTED
ENTEROCOCCUS SPECIES: NOT DETECTED
Escherichia coli: NOT DETECTED
HAEMOPHILUS INFLUENZAE: NOT DETECTED
KLEBSIELLA PNEUMONIAE: NOT DETECTED
Klebsiella oxytoca: NOT DETECTED
LISTERIA MONOCYTOGENES: NOT DETECTED
METHICILLIN RESISTANCE: DETECTED — AB
NEISSERIA MENINGITIDIS: NOT DETECTED
Proteus species: NOT DETECTED
Pseudomonas aeruginosa: NOT DETECTED
STREPTOCOCCUS AGALACTIAE: NOT DETECTED
STREPTOCOCCUS PYOGENES: NOT DETECTED
STREPTOCOCCUS SPECIES: NOT DETECTED
Serratia marcescens: NOT DETECTED
Staphylococcus aureus (BCID): DETECTED — AB
Staphylococcus species: DETECTED — AB
Streptococcus pneumoniae: NOT DETECTED

## 2017-02-02 LAB — EXPECTORATED SPUTUM ASSESSMENT W REFEX TO RESP CULTURE

## 2017-02-02 LAB — PROCALCITONIN
Procalcitonin: 16.4 ng/mL
Procalcitonin: 31.85 ng/mL

## 2017-02-02 LAB — TROPONIN I: Troponin I: <0.03 ng/mL (ref <0.03)

## 2017-02-02 LAB — C DIFFICILE QUICK SCREEN W PCR REFLEX
C Diff antigen: NEGATIVE
C Diff interpretation: NOT DETECTED
C Diff toxin: NEGATIVE

## 2017-02-02 LAB — EXPECTORATED SPUTUM ASSESSMENT W GRAM STAIN, RFLX TO RESP C

## 2017-02-02 LAB — MAGNESIUM: MAGNESIUM: 1.5 mg/dL — AB (ref 1.7–2.4)

## 2017-02-02 LAB — GLUCOSE, CAPILLARY
GLUCOSE-CAPILLARY: 65 mg/dL (ref 65–99)
GLUCOSE-CAPILLARY: 74 mg/dL (ref 65–99)
GLUCOSE-CAPILLARY: 107 mg/dL — AB (ref 65–99)
Glucose-Capillary: 125 mg/dL — ABNORMAL HIGH (ref 65–99)
Glucose-Capillary: 76 mg/dL (ref 65–99)

## 2017-02-02 LAB — MRSA PCR SCREENING: MRSA by PCR: POSITIVE — AB

## 2017-02-02 LAB — PHOSPHORUS: PHOSPHORUS: 5.2 mg/dL — AB (ref 2.5–4.6)

## 2017-02-02 MED ORDER — BUDESONIDE 0.25 MG/2ML IN SUSP
0.2500 mg | Freq: Four times a day (QID) | RESPIRATORY_TRACT | Status: DC
  Administered 2017-02-02 – 2017-02-06 (×15): 0.25 mg via RESPIRATORY_TRACT
  Filled 2017-02-02 (×13): qty 2

## 2017-02-02 MED ORDER — SODIUM CHLORIDE 0.9 % IV SOLN
0.0000 ug/min | INTRAVENOUS | Status: DC
  Administered 2017-02-02: 70 ug/min via INTRAVENOUS
  Filled 2017-02-02: qty 4

## 2017-02-02 MED ORDER — VANCOMYCIN HCL IN DEXTROSE 1-5 GM/200ML-% IV SOLN
1000.0000 mg | Freq: Two times a day (BID) | INTRAVENOUS | Status: DC
  Administered 2017-02-02 – 2017-02-03 (×2): 1000 mg via INTRAVENOUS
  Filled 2017-02-02 (×3): qty 200

## 2017-02-02 MED ORDER — MUPIROCIN 2 % EX OINT
1.0000 "application " | TOPICAL_OINTMENT | Freq: Two times a day (BID) | CUTANEOUS | Status: AC
  Administered 2017-02-02 – 2017-02-06 (×10): 1 via NASAL
  Filled 2017-02-02 (×2): qty 22

## 2017-02-02 MED ORDER — CHLORHEXIDINE GLUCONATE CLOTH 2 % EX PADS
6.0000 | MEDICATED_PAD | Freq: Every day | CUTANEOUS | Status: AC
  Administered 2017-02-04 – 2017-02-07 (×3): 6 via TOPICAL

## 2017-02-02 MED ORDER — DEXTROSE 50 % IV SOLN
1.0000 | Freq: Once | INTRAVENOUS | Status: AC
  Administered 2017-02-02: 50 mL via INTRAVENOUS

## 2017-02-02 MED ORDER — SODIUM CHLORIDE 0.9 % IV SOLN
1.5000 g | Freq: Four times a day (QID) | INTRAVENOUS | Status: DC
  Administered 2017-02-02 – 2017-02-04 (×9): 1.5 g via INTRAVENOUS
  Filled 2017-02-02 (×12): qty 1.5

## 2017-02-02 MED ORDER — IPRATROPIUM-ALBUTEROL 0.5-2.5 (3) MG/3ML IN SOLN
3.0000 mL | Freq: Four times a day (QID) | RESPIRATORY_TRACT | Status: DC
  Administered 2017-02-02 – 2017-02-07 (×20): 3 mL via RESPIRATORY_TRACT
  Filled 2017-02-02 (×18): qty 3

## 2017-02-02 NOTE — Progress Notes (Signed)
PT Cancellation Note  Patient Details Name: Kristopher Powell MRN: [id number] DOB: 09/18/1955   Cancelled Treatment:    Reason Eval/Treat Not Completed: Medical issues which prohibited therapy (Per chart review, patient noted with decline in respiratory status and transfer to CCU.  Per policy, will require new orders to resume PT services.)  Vonnie Ligman H. Manson PasseyBrown, PT, DPT, NCS 02/02/17, 7:51 AM [phone]

## 2017-02-02 NOTE — Progress Notes (Signed)
Patient c/o a dull pain on L side of chest (9/10) when inhaling. Annabelle Harmanana, NP notified and wrote order to draw troponin. Troponin drawn, vss. Will continue to assess. Currently patient is off of bipap on 6L [place]. Trudee KusterBrandi R Mansfield

## 2017-02-02 NOTE — Procedures (Signed)
Central Venous Catheter Insertion Procedure Note - Right Femoral Emeterio ReeveHarmon L Tabb [id number] 06/21/1956  Procedure: Insertion of Central Venous Catheter Indications: Assessment of intravascular volume, Drug and/or fluid administration and Frequent blood sampling  Procedure Details Consent: Unable to obtain consent because of altered level of consciousness. Time Out: Verified patient identification, verified procedure, site/side was marked, verified correct patient position, special equipment/implants available, medications/allergies/relevent history reviewed, required imaging and test results available.  Performed  Maximum sterile technique was used including antiseptics, cap, gloves, gown, hand hygiene, mask and sheet. Skin prep: Chlorhexidine; local anesthetic administered A antimicrobial bonded/coated triple lumen catheter was placed in the right femoral vein due to emergent situation using the Seldinger technique.  Evaluation Blood flow good Complications: No apparent complications Patient did tolerate procedure well. Procedure performed under direct ultrasound guidance for real time vessel cannulation.      Bincy Varughese,AG-ACNP Pulmonary & Critical Care   Billy Fischeravid Jacorie Ernsberger, MD PCCM service Mobile [phone] Pager [phone] 02/02/2017 3:08 PM

## 2017-02-02 NOTE — Care Management (Addendum)
This RNCM notified Breanna with APS [phone] that patient is now in ICU on BiPAP. She suggested that he go somewhere else to live but patient apparently only agreed to short term care. He has Medicaid and this does not cover short term care- only long-term care and [place] not [place]. Son Reuel BoomDaniel is not aware that patient is in ICU as far as I can tell. Reuel BoomDaniel has not returned any of my calls. This RNCM attempted to contact Reuel BoomDaniel again but was not able to reach him.  I reached out to [facility] [place] for assistance [phone] with reaching patient's son Reuel BoomDaniel. Plan to discuss Daniel's availability to contact and patient O2 need in the home that he apparently is responsible for the electricity bill of ~1300$.

## 2017-02-02 NOTE — Progress Notes (Signed)
Pharmacy Antibiotic Note  Kristopher Powell is a 61 y.o. male admitted on 01/28/2017 with pneumonia.  Pharmacy has been consulted for vancomycin dosing. Patient is also ordered Unasyn.   Plan: Vancomycin 1000 mg iv q 8 hours ordered initially then d/c. CCM wanted to resume vancomycin. Will resume vancomycin at 1000 mg iv q 12 hours and check a level to r/o toxicity in the setting of possible acute renal failure with SCr doubling. UOP remains WNL.   Height: 6' (182.9 cm) Weight: 171 lb 4.8 oz (77.7 kg) IBW/kg (Calculated) : 77.6  Temp (24hrs), Avg:100.5 F (38.1 C), Min:98.3 F (36.8 C), Max:102.9 F (39.4 C)   Recent Labs Lab 01/28/17 1201 01/29/17 0411 01/31/17 0448 02/01/17 2151  WBC 13.0* 15.8*  --  17.3*  CREATININE 0.66 0.65 0.61 1.43*  LATICACIDVEN  --   --   --  4.7*    Estimated Creatinine Clearance: 59.5 mL/min (A) (by C-G formula based on SCr of 1.43 mg/dL (H)).    No Known Allergies   Thank you for allowing pharmacy to be a part of this patient's care.  Luisa HartScott Benjamim Harnish, PharmD Clinical Pharmacist  02/02/2017

## 2017-02-02 NOTE — Progress Notes (Signed)
Patient experiencing urinary retention (>800 mL). Dr. Sung AmabileSimonds notified, verbal order for foley received. Inserted without difficulty, patient tolerated well. Trudee KusterBrandi R Mansfield

## 2017-02-02 NOTE — Evaluation (Signed)
Clinical/Bedside Swallow Evaluation Patient Details  Name: Kristopher Powell MRN: [id number] Date of Birth: 11-15-1955  Today's Date: 02/02/2017 Time: SLP Start Time (ACUTE ONLY): 1503 SLP Stop Time (ACUTE ONLY): 1603 SLP Time Calculation (min) (ACUTE ONLY): 60 min  Past Medical History:  Past Medical History:  Diagnosis Date  . CHF (congestive heart failure) (HCC)   . COPD (chronic obstructive pulmonary disease) (HCC)   . Diabetes mellitus without complication (HCC)   . Hypertension   . MI (myocardial infarction) (HCC) 2006   Past Surgical History:  Past Surgical History:  Procedure Laterality Date  . APPENDECTOMY    . CHOLECYSTECTOMY    . CORONARY ANGIOPLASTY WITH STENT PLACEMENT     HPI:  Pt is a 61 y.o. male with a known history of COPD, tobacco use, chronic respiratory failure on 4 L oxygen, neuropathy, GERD, DM, MI, depression presents to the emergency room due to worsening shortness of breath. His saturations were found to be 70% on room air. And 88% on 4 L oxygen. EMS gave him a dose of Solu-Medrol and nebulizer and brought to the emergency room. Here patient has received 2 additional nebulizer treatments. No significant improvement. Pt was admitted for acute COPD exacerbation with acute on chronic respiratory failure. He has had 6 admission in the last 6 months. Since this admission, a rapid response was called d/t altered mental status, decreased O2 sats and change in pulse. Pt was transferred to CCU yesterday. Now on BIPAP intermittently; 6 liters via [place]. CXR showed pneumonia left lung. Pt is alert and verbally responsive; follows general commands and answers basic questions re: self - appeared min distracted but attended w/ direction/cues from SLP or NSG.    Assessment / Plan / Recommendation Clinical Impression  Pt appears at min increased risk for aspiration secondary to declined Pulmonary status; declined medical status overall. Pt is currently on 6 liters [place] w/ BIPAP prn; he  exhibits min increased RR and effort at rest prior to po trials. With po trials, pt's respiratory effort increased slighlty but w/ rest breaks b/t trials it calmed to his baseline(RR mid-upper 20's, O2 sats ~90%). Pt exhibited no overt s/s of aspiration w/ trials given; clear vocal quality b/t trials, no coughing. During the oral phase, pt required min increased time for gumming/mashing the broken down solids d/t Edentulous status(his baseline). Would recommend a Dysphagia level 2 (for easier oral phase management of foods) w/ Thin liquids; aspiration precautions; Pills in puree for safer swallowing at this time d/t pulmonary status. Recommend NO straws used; encouraged small, single sips slowly via Cup when drinking and Rest Breaks w/ ALL oral intake to reduce exertion during oral intake thus lessen risk for aspiration/choking. SLP Visit Diagnosis: Dysphagia, oropharyngeal phase (R13.12)    Aspiration Risk  Mild aspiration risk    Diet Recommendation  Dysphagia level 2 w/ Thin liquids - NO STRAWS!  Aspiration precautions; assistance at meals, monitoring  Medication Administration: Whole meds with puree    Other  Recommendations Recommended Consults:  (Dietician f/u) Oral Care Recommendations: Oral care BID;Staff/trained caregiver to provide oral care   Follow up Recommendations None      Frequency and Duration min 2x/week  1 week-2 weeks       Prognosis Prognosis for Safe Diet Advancement: Fair Barriers to Reach Goals: Cognitive deficits;Severity of deficits      Swallow Study   General Date of Onset: 01/28/17 HPI: Pt is a 61 y.o. male with a known history of COPD, tobacco  use, chronic respiratory failure on 4 L oxygen, neuropathy, GERD, DM, MI, depression presents to the emergency room due to worsening shortness of breath. His saturations were found to be 70% on room air. And 88% on 4 L oxygen. EMS gave him a dose of Solu-Medrol and nebulizer and brought to the emergency room. Here  patient has received 2 additional nebulizer treatments. No significant improvement. Pt was admitted for acute COPD exacerbation with acute on chronic respiratory failure. He has had 6 admission in the last 6 months. Since this admission, a rapid response was called d/t altered mental status, decreased O2 sats and change in pulse. Pt was transferred to CCU yesterday. Now on BIPAP intermittently; 6 liters via [place]. CXR showed pneumonia left lung. Pt is alert and verbally responsive; follows general commands and answers basic questions re: self - appeared min distracted but attended w/ direction/cues from SLP or NSG.  Type of Study: Bedside Swallow Evaluation Previous Swallow Assessment: none indicated Diet Prior to this Study: Regular;Thin liquids (pt is edentulous) Temperature Spikes Noted: Yes (100.1;  wbc elevated 17) Respiratory Status: Nasal cannula (6 liters) History of Recent Intubation: No Behavior/Cognition: Alert;Cooperative;Pleasant mood;Distractible;Requires cueing Oral Cavity Assessment: Dry (mucous - min) Oral Care Completed by SLP: Yes Oral Cavity - Dentition: Edentulous (baseline and does not use dentures) Vision: Functional for self-feeding Self-Feeding Abilities: Able to feed self;Needs assist;Needs set up Patient Positioning: Upright in bed Baseline Vocal Quality: Low vocal intensity Volitional Cough: Strong Volitional Swallow: Able to elicit    Oral/Motor/Sensory Function Overall Oral Motor/Sensory Function: Within functional limits   Ice Chips Ice chips: Within functional limits Presentation: Spoon (fed; 3 trials)   Thin Liquid Thin Liquid: Within functional limits Presentation: Cup;Self Fed (~8 ozs total) Other Comments: NO straws used; encouraged small, single sips slowly    Nectar Thick Nectar Thick Liquid: Not tested   Honey Thick Honey Thick Liquid: Not tested   Puree Puree: Within functional limits Presentation: Spoon;Self Fed (fed; 6 trials)   Solid   GO    Solid: Impaired (broken down, moistened well) Presentation: Spoon (fed; 3 trials) Oral Phase Impairments: Impaired mastication (edentulous) Oral Phase Functional Implications: Impaired mastication (increased time) Pharyngeal Phase Impairments:  (none) Other Comments: edentulous at baseline         Jerilynn SomKatherine Watson, MS, CCC-SLP Watson,Katherine 02/02/2017,4:10 PM

## 2017-02-02 NOTE — Progress Notes (Signed)
PHARMACY - PHYSICIAN COMMUNICATION CRITICAL VALUE ALERT - BLOOD CULTURE IDENTIFICATION (BCID)  Results for orders placed or performed during the hospital encounter of 01/28/17  Blood Culture ID Panel (Reflexed) (Collected: 02/01/2017  9:51 PM)  Result Value Ref Range   Enterococcus species NOT DETECTED NOT DETECTED   Listeria monocytogenes NOT DETECTED NOT DETECTED   Staphylococcus species DETECTED (A) NOT DETECTED   Staphylococcus aureus DETECTED (A) NOT DETECTED   Methicillin resistance DETECTED (A) NOT DETECTED   Streptococcus species NOT DETECTED NOT DETECTED   Streptococcus agalactiae NOT DETECTED NOT DETECTED   Streptococcus pneumoniae NOT DETECTED NOT DETECTED   Streptococcus pyogenes NOT DETECTED NOT DETECTED   Acinetobacter baumannii NOT DETECTED NOT DETECTED   Enterobacteriaceae species NOT DETECTED NOT DETECTED   Enterobacter cloacae complex NOT DETECTED NOT DETECTED   Escherichia coli NOT DETECTED NOT DETECTED   Klebsiella oxytoca NOT DETECTED NOT DETECTED   Klebsiella pneumoniae NOT DETECTED NOT DETECTED   Proteus species NOT DETECTED NOT DETECTED   Serratia marcescens NOT DETECTED NOT DETECTED   Haemophilus influenzae NOT DETECTED NOT DETECTED   Neisseria meningitidis NOT DETECTED NOT DETECTED   Pseudomonas aeruginosa NOT DETECTED NOT DETECTED   Candida albicans NOT DETECTED NOT DETECTED   Candida glabrata NOT DETECTED NOT DETECTED   Candida krusei NOT DETECTED NOT DETECTED   Candida parapsilosis NOT DETECTED NOT DETECTED   Candida tropicalis NOT DETECTED NOT DETECTED    Name of physician (or Provider) Contacted: Bincy Varugnese  Changes to prescribed antibiotics required: Patient on Unasyn + Vanc -- will continue current therapy and continue to follow BCx's  Thomasene RippleDavid  Elijha Dedman 02/02/2017  10:55 PM

## 2017-02-02 NOTE — Care Management (Signed)
I have not received [place] [facility] locating patient's son Reuel BoomDaniel. Per RN family has visited with patient today but unsure "who" it was. I stressed that RNCM would like to talk to Reuel BoomDaniel about discharge planning needs expected.

## 2017-02-02 NOTE — Progress Notes (Signed)
[facility] [place] [facility]   PATIENT NAME: Kristopher Powell    MR#:  [id number]  DATE OF BIRTH:  1955/11/13  SUBJECTIVE:   Transferred to ICU due to worsening breathing and mentation Now on BIPAP intermittently. CXR showed pneumonia left lung  REVIEW OF SYSTEMS:    Review of Systems  Constitutional: Positive for malaise/fatigue. Negative for chills and fever.  HENT: Negative for sore throat.   Eyes: Negative for blurred vision, double vision and pain.  Respiratory: Positive for cough, shortness of breath and wheezing. Negative for hemoptysis.   Cardiovascular: Negative for chest pain, palpitations, orthopnea and leg swelling.  Gastrointestinal: Negative for abdominal pain, constipation, diarrhea, heartburn, nausea and vomiting.  Genitourinary: Negative for dysuria and hematuria.  Musculoskeletal: Negative for back pain and joint pain.  Skin: Negative for rash.  Neurological: Positive for weakness. Negative for sensory change, speech change, focal weakness and headaches.  Endo/Heme/Allergies: Does not bruise/bleed easily.  Psychiatric/Behavioral: Negative for depression. The patient is nervous/anxious.     DRUG ALLERGIES:  No Known Allergies  VITALS:  Blood pressure 121/81, pulse (!) 109, temperature 98.7 F (37.1 C), temperature source Oral, resp. rate (!) 30, height 6' (1.829 m), weight 77.7 kg (171 lb 4.8 oz), SpO2 93 %.  PHYSICAL EXAMINATION:   Physical Exam  GENERAL:  61 y.o.-year-old patient lying in the bed with no acute distress. Appears chronically ill EYES: Pupils equal, round, reactive to light and accommodation. No scleral icterus. Extraocular muscles intact.  HEENT: Head atraumatic, normocephalic. Oropharynx and nasopharynx clear.  NECK:  Supple, no jugular venous distention. No thyroid enlargement, no tenderness.  LUNGS: Decreased air entry with bilateral expiratory wheezing CARDIOVASCULAR: S1, S2 normal. No murmurs, rubs, or gallops.   ABDOMEN: Soft, nontender, nondistended. Bowel sounds present. No organomegaly or mass.  EXTREMITIES: No cyanosis, clubbing or edema b/l.    NEUROLOGIC: Cranial nerves II through XII are intact. No focal Motor or sensory deficits b/l.   PSYCHIATRIC: The patient is alert and awake. Anxious SKIN: No obvious rash, lesion, or ulcer.  Tattoos ++  LABORATORY PANEL:   CBC  Recent Labs Lab 02/01/17 2151  WBC 17.3*  HGB 11.7*  HCT 34.4*  PLT 359   ------------------------------------------------------------------------------------------------------------------ Chemistries   Recent Labs Lab 02/01/17 2151 02/02/17 0723  NA 133*  --   K 4.0  --   CL 99*  --   CO2 24  --   GLUCOSE 105*  --   BUN 22*  --   CREATININE 1.43*  --   CALCIUM 8.4*  --   MG  --  1.5*  AST 78*  --   ALT 84*  --   ALKPHOS 84  --   BILITOT 0.8  --    ------------------------------------------------------------------------------------------------------------------  Cardiac Enzymes  Recent Labs Lab 02/01/17 2151  TROPONINI <0.03   ------------------------------------------------------------------------------------------------------------------  RADIOLOGY:  Dg Chest Port 1 View  Result Date: 02/01/2017 CLINICAL DATA:  Cough. EXAM: PORTABLE CHEST 1 VIEW COMPARISON:  Radiographs of January 28, 2017. FINDINGS: Stable cardiomediastinal silhouette. No pneumothorax is noted. Old left rib fractures are noted. There is interval development of patchy opacities throughout the left lung most consistent with pneumonia. New focal opacity is noted in right midlung most consistent with pneumonia. No significant pleural effusion is noted. IMPRESSION: Interval development of patchy opacities throughout the left lung as well as focal opacity in right midlung most consistent with pneumonia. Followup PA and lateral chest X-ray is recommended in 3-4 weeks following trial of  antibiotic therapy to ensure resolution and exclude  underlying malignancy. Electronically Signed   By: Lupita RaiderJames  Green Jr, M.D.   On: 02/01/2017 21:37     ASSESSMENT AND PLAN:  Kristopher Powell  is a [age] y.o. male with a known history of COPD, tobacco use, chronic respiratory failure on 4 L oxygen, neuropathy, GERD, depression presents to the emergency room due to worsening shortness of breath. His saturations were found to be 70% on room air. And 88% on 4 L oxygen. EMS gave him a dose of Solu-Medrol and nebulizer and brought to the emergency room. Here patient has received 2 additional nebulizer treatments  * Acute COPD exacerbation with acute on chronic respiratory failure due to ongoing tobacco use Still has wheezing.  Shortness of breath on ambulation.  -po taper prednisone.  -prn nebulizer treatment.  * Left sided pneumonia -IV vanc and unasyn -sputum cx GPC and GNR -MRSA PCR positive  * Chronic mild hyponatremia due to pulmonary issues. Is stable. He is also supposed to be on fluid restriction. Recent nephrology notes. Order was entered.  * h/o Diabetes mellitus Or hyperglycemia due to steroids. Recent hemoglobin A1c is 5.5.  * Hypertension. Continue home medications of Toprol and Norvasc.  * Tobacco abuse Counseled patient to quit On admission  * DVT prophylaxis with Lovenox  CSW for d/c planning---[facility]  All the records are reviewed and case discussed with Care Management/Social Worker Management plans discussed with the patient, family and they are in agreement.  CODE STATUS: Partial code  DVT Prophylaxis: SCDs  TOTAL TIME TAKING CARE OF THIS PATIENT:25 minutes.   POSSIBLE D/C IN 1-2 DAYS, DEPENDING ON CLINICAL CONDITION.  Leathia Farnell M.D on 02/02/2017 at 1:56 PM  Between 7am to 6pm - Pager - [phone]  After 6pm go to [email or web address] - password [email or web address] [facility]  [facility] [place] [facility]  Office  [phone]  CC: Primary care physician; Candie ChromanBarzin, Amir Homayoun, DO  Note: This dictation was  prepared with Dragon dictation along with smaller phrase technology. Any transcriptional errors that result from this process are unintentional.

## 2017-02-02 NOTE — Clinical Social Work Note (Signed)
Patient has [facility] [place] and can go there at discharge. Patient now has transitioned to ICU. CSW will continue to follow. York SpanielMonica Kamiah Fite MSW,LCSW [phone]

## 2017-02-03 ENCOUNTER — Inpatient Hospital Stay
Admit: 2017-02-03 | Discharge: 2017-02-03 | Disposition: A | Discharge status: Home / Self Care | Payer: Medicaid Other | Attending: Critical Care Medicine | Admitting: Critical Care Medicine

## 2017-02-03 DIAGNOSIS — J15212 Pneumonia due to Methicillin resistant Staphylococcus aureus: Secondary | ICD-10-CM

## 2017-02-03 DIAGNOSIS — R7881 Bacteremia: Secondary | ICD-10-CM

## 2017-02-03 DIAGNOSIS — J441 Chronic obstructive pulmonary disease with (acute) exacerbation: Principal | ICD-10-CM

## 2017-02-03 LAB — VANCOMYCIN, TROUGH: Vancomycin Tr: 18 ug/mL (ref 15–20)

## 2017-02-03 LAB — CREATININE, SERUM
Creatinine, Ser: 0.85 mg/dL (ref 0.61–1.24)
GFR calc Af Amer: >60 mL/min (ref >60)
GFR calc non Af Amer: >60 mL/min (ref >60)

## 2017-02-03 LAB — GLUCOSE, CAPILLARY
GLUCOSE-CAPILLARY: 77 mg/dL (ref 65–99)
GLUCOSE-CAPILLARY: 135 mg/dL — AB (ref 65–99)
GLUCOSE-CAPILLARY: 170 mg/dL — AB (ref 65–99)
GLUCOSE-CAPILLARY: 156 mg/dL — AB (ref 65–99)

## 2017-02-03 LAB — VANCOMYCIN, RANDOM: Vancomycin Rm: 19

## 2017-02-03 LAB — PROCALCITONIN: PROCALCITONIN: 40.86 ng/mL

## 2017-02-03 LAB — MAGNESIUM: MAGNESIUM: 1.8 mg/dL (ref 1.7–2.4)

## 2017-02-03 MED ORDER — PREDNISONE 20 MG PO TABS
40.0000 mg | ORAL_TABLET | Freq: Every day | ORAL | Status: DC
  Administered 2017-02-03 – 2017-02-06 (×4): 40 mg via ORAL
  Filled 2017-02-03 (×4): qty 2

## 2017-02-03 MED ORDER — PREDNISONE 20 MG PO TABS: 40.0000 mg | ORAL_TABLET | Freq: Every day | ORAL | Status: DC

## 2017-02-03 MED ORDER — VANCOMYCIN HCL IN DEXTROSE 1-5 GM/200ML-% IV SOLN
1000.0000 mg | Freq: Three times a day (TID) | INTRAVENOUS | Status: DC
  Administered 2017-02-03 – 2017-02-07 (×14): 1000 mg via INTRAVENOUS
  Filled 2017-02-03 (×16): qty 200

## 2017-02-03 NOTE — Progress Notes (Signed)
Nutrition Follow-up  DOCUMENTATION CODES:   Severe malnutrition in context of social or environmental circumstances  INTERVENTION:  1. NDD2-Thin per SLP  NUTRITION DIAGNOSIS:   Malnutrition (Severe) related to social / environmental circumstances (limited access to food) as evidenced by percent weight loss, severe depletion of body fat, moderate depletions of muscle mass, severe depletion of muscle mass. -ongoing  GOAL:   Patient will meet greater than or equal to 90% of their needs -not meeting  MONITOR:   PO intake, Labs  REASON FOR ASSESSMENT:   Malnutrition Screening Tool    ASSESSMENT:   61 year old male with PMHx of COPD, DM, CHF, HTN, hx of MI in 2006 admitted with acute exacerbation of COPD, chronic hyponatremia due to pulmonary issues on 2 L fluid restriction.  PO 100% so far, no complaints suspected aspiration PNA - placed on NDD2 per SLP. Monitor PO intake on modified diet. Labs and medications reviewed: Protonix, Prednisone  Diet Order:  DIET DYS 2 Room service appropriate? Yes with Assist; Fluid consistency: Thin; Fluid restriction: 2000 mL Fluid  Skin:  Wound (see comment) (cellulitis left leg)  Last BM:  01/28/2017  Height:   Ht Readings from Last 1 Encounters:  01/28/17 6' (1.829 m)    Weight:   Wt Readings from Last 1 Encounters:  02/03/17 175 lb 14.8 oz (79.8 kg)    Ideal Body Weight:  80.9 kg  BMI:  Body mass index is 23.86 kg/m.  Estimated Nutritional Needs:   Kcal:  [id number] (MSJ x 1.3-1.4)  Protein:  100-115 grams (1.3-1.5 grams/kg)  Fluid:  2 L/day fluid restriction per MD  EDUCATION NEEDS:   Education needs addressed  Dionne AnoWilliam M. Yakelin Grenier, MS, RD LDN Inpatient Clinical Dietitian Pager [phone]

## 2017-02-03 NOTE — Care Management (Signed)
Younger son Kristopher Powell is asleep in the recliner at patient's bedside. Kristopher Powell with APS notified by voicemail that Kristopher Powell is at bedside. Patient is pending ID consult and possibly require private bed at SNF; also BiPAP is needed at bedtime. CSW updated. RNCM will follow.

## 2017-02-03 NOTE — Progress Notes (Signed)
Speech Therapy Note: reviewed chart notes; consulted NSG re: pt's status today. Pt visiting w/ family. NSG reported good toleration of oral diet and swallowing w/ no overt s/s of aspiration noted w/ meals/pills following general aspiration precautions. ST services will continue to f/u w/ pt while admitted; education on aspiration precautions. NSG agreed.   Jerilynn SomKatherine Addley Ballinger, MS, CCC-SLP

## 2017-02-03 NOTE — Progress Notes (Signed)
Pharmacy Antibiotic Note  Kristopher Powell is a 61 y.o. male admitted on 01/28/2017 with pneumonia.  Pharmacy has been consulted for vancomycin dosing. Patient is also ordered Unasyn.   Plan: Vancomycin 1000 mg iv q 8 hours ordered initially then d/c. CCM wanted to resume vancomycin. Will resume vancomycin at 1000 mg iv q 12 hours and check a level to r/o toxicity in the setting of possible acute renal failure with SCr doubling. UOP remains WNL.  6/20 @ 2221 VT 10 subtherapeutic, level drawn appropriately 12 hours after prior dose. Although this is not at steady patient is clearing the drug.   6/21 @ 0400 Stat Scr 0.85 ~ CrCl 100.2 ml/min. 6/21 @ 0300 VR 19 after 1g dose given @ 0045.  Considering patient's renal function is stable (Scr and UOP WNL) and patient has a positive BCx -- will readjust vancomycin dose back to vanc 1g IV q8h and will draw a vanc trough 6/21 @ 1545 prior to 3rd dose to ensure patient is clearing appropriately. Ke 0.0875 T1/2 8 hours Css 16 mcg/mL  Height: 6' (182.9 cm) Weight: 171 lb 4.8 oz (77.7 kg) IBW/kg (Calculated) : 77.6  Temp (24hrs), Avg:99.3 F (37.4 C), Min:98.7 F (37.1 C), Max:100.1 F (37.8 C)   Recent Labs Lab 01/28/17 1201 01/29/17 0411 01/31/17 0448 02/01/17 2151 02/02/17 2221 02/03/17 0317 02/03/17 0409  WBC 13.0* 15.8*  --  17.3*  --   --   --   CREATININE 0.66 0.65 0.61 1.43*  --   --  0.85  LATICACIDVEN  --   --   --  4.7*  --   --   --   VANCOTROUGH  --   --   --   --  10*  --   --   VANCORANDOM  --   --   --   --   --  19  --     Estimated Creatinine Clearance: 100.2 mL/min (by C-G formula based on SCr of 0.85 mg/dL).    No Known Allergies   Thank you for allowing pharmacy to be a part of this patient's care.  Thomasene Rippleavid Ailin Rochford, PharmD, BCPS Clinical Pharmacist 02/03/2017

## 2017-02-03 NOTE — Progress Notes (Deleted)
   Name: Kristopher Powell L Lupton MRN: [id number] DOB: 01/17/1956    ADMISSION DATE:  01/28/2017  CONSULTATION DATE: 6/19  REFERRING MD :  Dr. Anne HahnWillis  CHIEF COMPLAINT: Altered mental status, Hypoxia  BRIEF PATIENT DESCRIPTION: 61 year old male initially admitted with COPD.  Now transferred from the floor due to altered mental status and hypoxia  due to Pneumonia  SIGNIFICANT EVENTS  6/15 Patient admitted with COPD Exacerbation 6/19 Patient transferred from the floor due to altered , mental status and hypoxia due to PNA  STUDIES:  None  SUBJECTIVE:  Pt states his breathing has improved he did require a brief period of Bipap overnight due to shortness of breath   VITAL SIGNS: Temp:  [97.2 F (36.2 C)-101.8 F (38.8 C)] 97.2 F (36.2 C) (06/21 0730) Pulse Rate:  [103-133] 110 (06/21 1111) Resp:  [20-35] 26 (06/21 1111) BP: (74-138)/(51-90) 111/74 (06/21 1111) SpO2:  [87 %-94 %] 92 % (06/21 1111) FiO2 (%):  [35 %] 35 % (06/21 0749) Weight:  [79.8 kg (175 lb 14.8 oz)] 79.8 kg (175 lb 14.8 oz) (06/21 0500)  PHYSICAL EXAMINATION: General:  61 year old male, on 6L, in no acute distress Neuro: follows commands, alert and oriented HEENT:  AT,[place],No JVD, Cardiovascular:  Tachycardic,s1s2,nom/r/g Lungs: expiratory wheezes and rhonchi right upper and middle lobe, diminished in all other lobes, even non labored  Abdomen: soft,NT,ND Musculoskeletal:  No edema,cyanosis  Skin:  Warm,dry and intact   Recent Labs Lab 01/29/17 0411 01/31/17 0448 02/01/17 2151 02/03/17 0409  NA 132* 135 133*  --   K 4.1 3.9 4.0  --   CL 104 101 99*  --   CO2 24 27 24   --   BUN 12 16 22*  --   CREATININE 0.65 0.61 1.43* 0.85  GLUCOSE 165* 96 105*  --     Recent Labs Lab 01/28/17 1201 01/29/17 0411 02/01/17 2151  HGB 13.3 11.7* 11.7*  HCT 37.9* 34.1* 34.4*  WBC 13.0* 15.8* 17.3*  PLT 416 409 359   Dg Chest Port 1 View  Result Date: 02/01/2017 CLINICAL DATA:  Cough. EXAM: PORTABLE CHEST 1 VIEW  COMPARISON:  Radiographs of January 28, 2017. FINDINGS: Stable cardiomediastinal silhouette. No pneumothorax is noted. Old left rib fractures are noted. There is interval development of patchy opacities throughout the left lung most consistent with pneumonia. New focal opacity is noted in right midlung most consistent with pneumonia. No significant pleural effusion is noted. IMPRESSION: Interval development of patchy opacities throughout the left lung as well as focal opacity in right midlung most consistent with pneumonia. Followup PA and lateral chest X-ray is recommended in 3-4 weeks following trial of antibiotic therapy to ensure resolution and exclude underlying malignancy. Electronically Signed   By: Lupita RaiderJames  Green Jr, M.D.   On: 02/01/2017 21:37   ASSESSMENT / PLAN: Severe COPD CAP, NOS Acute on chronic respiratory failure MRSA PCR+  PLAN/REC: Cont PRN BiPAP Cont supplemental O2 Will start po prednisone Cont nebulized bronchodilators Cont Unasyn and Vanc Per ID repeating blood cultures once blood cultures negative pt will need PICC line placement for 2 week course of iv abx treatment  Echo pending   Sonda Rumbleana Calisa Luckenbaugh, AGNP  Pulmonary/Critical Care Pager [phone] (please enter 7 digits) PCCM Consult Pager [phone] (please enter 7 digits)

## 2017-02-03 NOTE — Progress Notes (Signed)
Name: Kristopher Powell L Maenza MRN: [id number] DOB: 01/04/1956    ADMISSION DATE:  01/28/2017  CONSULTATION DATE: 6/19  REFERRING MD :  Dr. Anne HahnWillis  CHIEF COMPLAINT: Altered mental status, Hypoxia  BRIEF PATIENT DESCRIPTION: 61 year old male initially admitted with COPD.  Now transferred from the floor due to altered mental status and hypoxia  due to Pneumonia  SIGNIFICANT EVENTS  6/15 Patient admitted with COPD Exacerbation 6/19 Patient transferred from the floor due to altered , mental status and hypoxia due to PNA  STUDIES:  None  SUBJECTIVE:  Pt states his breathing has improved he did require a brief period of Bipap overnight due to shortness of breath   VITAL SIGNS: Temp:  [97.2 F (36.2 C)-101.8 F (38.8 C)] 97.7 F (36.5 C) (06/21 1200) Pulse Rate:  [98-133] 113 (06/21 1330) Resp:  [20-35] 30 (06/21 1330) BP: (74-125)/(51-90) 103/67 (06/21 1330) SpO2:  [87 %-95 %] 90 % (06/21 1347) FiO2 (%):  [35 %] 35 % (06/21 0749) Weight:  [175 lb 14.8 oz (79.8 kg)] 175 lb 14.8 oz (79.8 kg) (06/21 0500)  PHYSICAL EXAMINATION: General:  61 year old male, on 6L, in no acute distress Neuro: follows commands, alert and oriented HEENT:  AT,[place],No JVD, Cardiovascular:  Tachycardic,s1s2,nom/r/g Lungs: expiratory wheezes and rhonchi right upper and middle lobe, diminished in all other lobes, even non labored  Abdomen: soft,NT,ND Musculoskeletal:  No edema,cyanosis  Skin:  Warm,dry and intact   Recent Labs Lab 01/29/17 0411 01/31/17 0448 02/01/17 2151 02/03/17 0409  NA 132* 135 133*  --   K 4.1 3.9 4.0  --   CL 104 101 99*  --   CO2 24 27 24   --   BUN 12 16 22*  --   CREATININE 0.65 0.61 1.43* 0.85  GLUCOSE 165* 96 105*  --     Recent Labs Lab 01/28/17 1201 01/29/17 0411 02/01/17 2151  HGB 13.3 11.7* 11.7*  HCT 37.9* 34.1* 34.4*  WBC 13.0* 15.8* 17.3*  PLT 416 409 359   Dg Chest Port 1 View  Result Date: 02/01/2017 CLINICAL DATA:  Cough. EXAM: PORTABLE CHEST 1 VIEW  COMPARISON:  Radiographs of January 28, 2017. FINDINGS: Stable cardiomediastinal silhouette. No pneumothorax is noted. Old left rib fractures are noted. There is interval development of patchy opacities throughout the left lung most consistent with pneumonia. New focal opacity is noted in right midlung most consistent with pneumonia. No significant pleural effusion is noted. IMPRESSION: Interval development of patchy opacities throughout the left lung as well as focal opacity in right midlung most consistent with pneumonia. Followup PA and lateral chest X-ray is recommended in 3-4 weeks following trial of antibiotic therapy to ensure resolution and exclude underlying malignancy. Electronically Signed   By: Lupita RaiderJames  Green Jr, M.D.   On: 02/01/2017 21:37   ASSESSMENT / PLAN: Severe COPD MRSA PNA with bacteremia Acute on chronic respiratory failure  PLAN/REC: Cont PRN BiPAP Cont supplemental O2 Prednisone 40 mg daily and taper to off Cont nebulized bronchodilators Cont Unasyn and Vanc ID has seen - repeating blood cultures once blood cultures negative pt will need PICC line placement for 2 week course of iv abx treatment  Echo pending    Sonda Rumbleana Blakeney, AGNP  Pulmonary/Critical Care Pager [phone] (please enter 7 digits) PCCM Consult Pager [phone] (please enter 7 digits)    PCCM ATTENDING ATTESTATION: I have evaluated patient with the APP Blakeney, reviewed database in its entirety and discussed care plan in detail. In addition, this patient  was discussed on multidisciplinary rounds.   Important exam findings: Comfortable on [place] O2 Diffuse scattered wheezes L sided rhonchi  CXR: No new film  Major problems addressed by PCCM team: Severe COPD Acute on chronic respiratory failure CAP - likely MRSA PNA MRSA bacteremia   PLAN/REC: As above If resp culture also reveals MRSA, can likely stop amp-sulbactam Watch in SDU through today. If doesn't require BiPAP, can go to med-surg  tomorrow    Billy Fischer, MD PCCM service Mobile [phone] Pager [phone] 02/03/2017 2:47 PM

## 2017-02-03 NOTE — Progress Notes (Signed)
Pharmacy Antibiotic Note  Kristopher ReeveHarmon L Powell is a 61 y.o. male admitted on 01/28/2017 with pneumonia.  Pharmacy has been consulted for vancomycin dosing. Patient is also ordered Unasyn.   Plan: Vancomycin level @ 1330=18. This is not a true steady state nor it is a true trough as it was drawn a few hours prior to next dose being due. Patient renal function appears stable. Will recheck trough at steady state- tomorrow evening @ 1600.  Height: 6' (182.9 cm) Weight: 175 lb 14.8 oz (79.8 kg) IBW/kg (Calculated) : 77.6  Temp (24hrs), Avg:99.1 F (37.3 C), Min:97.2 F (36.2 C), Max:101.8 F (38.8 C)   Recent Labs Lab 01/28/17 1201 01/29/17 0411 01/31/17 0448 02/01/17 2151 02/02/17 2221 02/03/17 0317 02/03/17 0409 02/03/17 1328  WBC 13.0* 15.8*  --  17.3*  --   --   --   --   CREATININE 0.66 0.65 0.61 1.43*  --   --  0.85  --   LATICACIDVEN  --   --   --  4.7*  --   --   --   --   VANCOTROUGH  --   --   --   --  10*  --   --  18  VANCORANDOM  --   --   --   --   --  19  --   --     Estimated Creatinine Clearance: 100.2 mL/min (by C-G formula based on SCr of 0.85 mg/dL).    No Known Allergies   Thank you for allowing pharmacy to be a part of this patient's care.  Olene FlossMelissa D Nakota Elsen, Pharm.D, BCPS Clinical Pharmacist  02/03/2017

## 2017-02-03 NOTE — Progress Notes (Signed)
*  PRELIMINARY RESULTS* Echocardiogram 2D Echocardiogram has been performed.  Cristela BlueHege, Jarid Sasso 02/03/2017, 3:40 PM

## 2017-02-03 NOTE — Progress Notes (Signed)
[facility] [place] [facility]   PATIENT NAME: Kristopher Powell    MR#:  [id number]  DATE OF BIRTH:  July 03, 1956  SUBJECTIVE:   Per RN patient has been constantly asking for food and fluids Now on BIPAP intermittently. CXR showed pneumonia left lung Currently off BiPAP. Son sleeping in the recliner in the room  REVIEW OF SYSTEMS:    Review of Systems  Constitutional: Positive for malaise/fatigue. Negative for chills and fever.  HENT: Negative for sore throat.   Eyes: Negative for blurred vision, double vision and pain.  Respiratory: Positive for cough, shortness of breath and wheezing. Negative for hemoptysis.   Cardiovascular: Negative for chest pain, palpitations, orthopnea and leg swelling.  Gastrointestinal: Negative for abdominal pain, constipation, diarrhea, heartburn, nausea and vomiting.  Genitourinary: Negative for dysuria and hematuria.  Musculoskeletal: Negative for back pain and joint pain.  Skin: Negative for rash.  Neurological: Positive for weakness. Negative for sensory change, speech change, focal weakness and headaches.  Endo/Heme/Allergies: Does not bruise/bleed easily.  Psychiatric/Behavioral: Negative for depression. The patient is nervous/anxious.     DRUG ALLERGIES:  No Known Allergies  VITALS:  Blood pressure 111/74, pulse (!) 110, temperature 97.2 F (36.2 C), temperature source Axillary, resp. rate (!) 26, height 6' (1.829 m), weight 79.8 kg (175 lb 14.8 oz), SpO2 92 %.  PHYSICAL EXAMINATION:   Physical Exam  GENERAL:  61 y.o.-year-old patient lying in the bed with no acute distress. Appears chronically ill EYES: Pupils equal, round, reactive to light and accommodation. No scleral icterus. Extraocular muscles intact.  HEENT: Head atraumatic, normocephalic. Oropharynx and nasopharynx clear.  NECK:  Supple, no jugular venous distention. No thyroid enlargement, no tenderness.  LUNGS: Decreased air entry with bilateral expiratory  wheezing CARDIOVASCULAR: S1, S2 normal. No murmurs, rubs, or gallops.  ABDOMEN: Soft, nontender, nondistended. Bowel sounds present. No organomegaly or mass.  EXTREMITIES: No cyanosis, clubbing or edema b/l.    NEUROLOGIC: Cranial nerves II through XII are intact. No focal Motor or sensory deficits b/l.   PSYCHIATRIC: The patient is alert and awake. Anxious SKIN: No obvious rash, lesion, or ulcer.  Tattoos ++  LABORATORY PANEL:   CBC  Recent Labs Lab 02/01/17 2151  WBC 17.3*  HGB 11.7*  HCT 34.4*  PLT 359   ------------------------------------------------------------------------------------------------------------------ Chemistries   Recent Labs Lab 02/01/17 2151 02/02/17 0723 02/03/17 0409  NA 133*  --   --   K 4.0  --   --   CL 99*  --   --   CO2 24  --   --   GLUCOSE 105*  --   --   BUN 22*  --   --   CREATININE 1.43*  --  0.85  CALCIUM 8.4*  --   --   MG  --  1.5*  --   AST 78*  --   --   ALT 84*  --   --   ALKPHOS 84  --   --   BILITOT 0.8  --   --    ------------------------------------------------------------------------------------------------------------------  Cardiac Enzymes  Recent Labs Lab 02/02/17 1342  TROPONINI <0.03   ------------------------------------------------------------------------------------------------------------------  RADIOLOGY:  Dg Chest Port 1 View  Result Date: 02/01/2017 CLINICAL DATA:  Cough. EXAM: PORTABLE CHEST 1 VIEW COMPARISON:  Radiographs of January 28, 2017. FINDINGS: Stable cardiomediastinal silhouette. No pneumothorax is noted. Old left rib fractures are noted. There is interval development of patchy opacities throughout the left lung most consistent with pneumonia. New focal  opacity is noted in right midlung most consistent with pneumonia. No significant pleural effusion is noted. IMPRESSION: Interval development of patchy opacities throughout the left lung as well as focal opacity in right midlung most consistent  with pneumonia. Followup PA and lateral chest X-ray is recommended in 3-4 weeks following trial of antibiotic therapy to ensure resolution and exclude underlying malignancy. Electronically Signed   By: Lupita RaiderJames  Green Jr, M.D.   On: 02/01/2017 21:37     ASSESSMENT AND PLAN:  [name]  is a 61 y.o. male with a known history of COPD, tobacco use, chronic respiratory failure on 4 L oxygen, neuropathy, GERD, depression presents to the emergency room due to worsening shortness of breath. His saturations were found to be 70% on room air. And 88% on 4 L oxygen. EMS gave him a dose of Solu-Medrol and nebulizer and brought to the emergency room. Here patient has received 2 additional nebulizer treatments  * Acute COPD exacerbation with acute on chronic respiratory failure due to ongoing tobacco use Shortness of breath on ambulation.  -po taper prednisone.  -prn nebulizer treatment.  * Left sided pneumonia -IV vanc and unasyn -sputum cx GPC and GNR -MRSA PCR positive  * Chronic mild hyponatremia due to pulmonary issues. Is stable. He is also supposed to be on fluid restriction. Recent nephrology notes. Order was entered.  * h/o Diabetes mellitus Or hyperglycemia due to steroids. Recent hemoglobin A1c is 5.5.  * Hypertension. Continue home medications of Toprol and Norvasc.  * Tobacco abuse Counseled patient to quit On admission  * DVT prophylaxis with Lovenox  CSW for d/c planning---[facility]  All the records are reviewed and case discussed with Care Management/Social Worker Management plans discussed with the patient, family and they are in agreement.  CODE STATUS: Partial code  DVT Prophylaxis: SCDs  TOTAL TIME TAKING CARE OF THIS PATIENT:25 minutes.   POSSIBLE D/C IN 1-2 DAYS, DEPENDING ON CLINICAL CONDITION.  Jeanette Rauth M.D on 02/03/2017 at 11:44 AM  Between 7am to 6pm - Pager - [phone]  After 6pm go to [email or web address] - password [email or web address] [facility]  [facility]  Office  [phone]  CC: Primary care physician; Candie ChromanBarzin, Amir Homayoun, DO  Note: This dictation was prepared with Dragon dictation along with smaller phrase technology. Any transcriptional errors that result from this process are unintentional.

## 2017-02-03 NOTE — Consult Note (Addendum)
[facility] Infectious Disease     Reason for Consult:MRSA bacteremia, PNA    Referring Physician: Nicholes Mango Date of Admission:  01/28/2017   Active Problems:   COPD exacerbation ([place])   Weakness   HPI: Kristopher Powell is a 61 y.o. male admitted with recurrent COPD exacerbation (6 admissions in 6 months)  Initially afebrile but he developed worsening fevers 6/19 and bcx done that day show MRSA. He did not have a central line in place per report at that time but imaging did show progressive PNA and sputum cx is also growing Staph aureus.  He remains in the unit but is not on pressors.  Uses Bipap at night but is on O2 4-5 L at baseline.   Past Medical History:  Diagnosis Date  . CHF (congestive heart failure) ([place])   . COPD (chronic obstructive pulmonary disease) ([place])   . Diabetes mellitus without complication ([place])   . Hypertension   . MI (myocardial infarction) ([place]) 2006   Past Surgical History:  Procedure Laterality Date  . APPENDECTOMY    . CHOLECYSTECTOMY    . CORONARY ANGIOPLASTY WITH STENT PLACEMENT     Social History  Substance Use Topics  . Smoking status: Current Every Day Smoker    Packs/day: 1.00    Years: 40.00    Types: Cigarettes  . Smokeless tobacco: Never Used  . Alcohol use 7.2 - 14.4 oz/week    12 - 24 Cans of beer per week     Comment: occ   Family History  Problem Relation Age of Onset  . CAD Mother        deceased 67  . Stroke Father        deceased 4  . Heart attack Brother        deceased 3    Allergies: No Known Allergies  Current antibiotics: Antibiotics Given (last 72 hours)    Date/Time Action Medication Dose Rate   02/01/17 2219 New Bag/Given   vancomycin (VANCOCIN) IVPB 1000 mg/200 mL premix 1,000 mg 200 mL/hr   02/01/17 2219 New Bag/Given   piperacillin-tazobactam (ZOSYN) IVPB 3.375 g 3.375 g 12.5 mL/hr   02/02/17 0419 New Bag/Given   vancomycin (VANCOCIN) IVPB 1000 mg/200 mL premix 1,000 mg 200 mL/hr   02/02/17 0631  New Bag/Given   ampicillin-sulbactam (UNASYN) 1.5 g in sodium chloride 0.9 % 50 mL IVPB 1.5 g 100 mL/hr   02/02/17 1031 New Bag/Given   vancomycin (VANCOCIN) IVPB 1000 mg/200 mL premix 1,000 mg 200 mL/hr   02/02/17 1210 New Bag/Given   ampicillin-sulbactam (UNASYN) 1.5 g in sodium chloride 0.9 % 50 mL IVPB 1.5 g 100 mL/hr   02/02/17 1738 New Bag/Given   ampicillin-sulbactam (UNASYN) 1.5 g in sodium chloride 0.9 % 50 mL IVPB 1.5 g 100 mL/hr   02/03/17 0045 New Bag/Given   vancomycin (VANCOCIN) IVPB 1000 mg/200 mL premix 1,000 mg 200 mL/hr   02/03/17 0156 New Bag/Given   ampicillin-sulbactam (UNASYN) 1.5 g in sodium chloride 0.9 % 50 mL IVPB 1.5 g 100 mL/hr   02/03/17 0726 New Bag/Given   ampicillin-sulbactam (UNASYN) 1.5 g in sodium chloride 0.9 % 50 mL IVPB 1.5 g 100 mL/hr   02/03/17 6834 New Bag/Given   vancomycin (VANCOCIN) IVPB 1000 mg/200 mL premix 1,000 mg 200 mL/hr      MEDICATIONS: . aspirin EC  325 mg Oral Daily  . budesonide (PULMICORT) nebulizer solution  0.25 mg Nebulization Q6H  . Chlorhexidine Gluconate Cloth  6 each Topical  Q0600  . enoxaparin (LOVENOX) injection  40 mg Subcutaneous Q24H  . gabapentin  1,200 mg Oral TID  . imipramine  200 mg Oral QHS  . insulin aspart  0-15 Units Subcutaneous TID WC  . insulin aspart  0-5 Units Subcutaneous QHS  . ipratropium-albuterol  3 mL Nebulization Q6H  . metoprolol succinate  25 mg Oral Daily  . mupirocin ointment  1 application Nasal BID  . pantoprazole  40 mg Oral Daily  . predniSONE  30 mg Oral Q breakfast   Followed by  . [START ON 02/04/2017] predniSONE  20 mg Oral Q breakfast   Followed by  . [START ON 02/05/2017] predniSONE  10 mg Oral Q breakfast  . risperiDONE  4 mg Oral QHS  . sodium chloride flush  3 mL Intravenous Q12H    Review of Systems - 11 systems reviewed and negative per HPI   OBJECTIVE: Temp:  [97.2 F (36.2 C)-101.8 F (38.8 C)] 97.2 F (36.2 C) (06/21 0730) Pulse Rate:  [103-133] 105 (06/21  0830) Resp:  [20-35] 24 (06/21 0830) BP: (74-138)/(51-83) 90/57 (06/21 0830) SpO2:  [87 %-98 %] 93 % (06/21 0830) FiO2 (%):  [35 %] 35 % (06/21 0749) Weight:  [79.8 kg (175 lb 14.8 oz)] 79.8 kg (175 lb 14.8 oz) (06/21 0500) Physical Exam  Constitutional: frail,chronically ill appearing. tachypneic HENT:  anicteric Mouth/Throat: Oropharynx is clear and dry. Poor dentition. No oropharyngeal exudate.  Cardiovascular: Normal rate, regular rhythm and normal heart sounds. Exam reveals no gallop and no friction rub.  Pulmonary/Chest: poor air movement. Rhonchi throughout Abdominal: Soft. Bowel sounds are normal. He exhibits no distension. There is no tenderness.  Lymphadenopathy: He has no cervical adenopathy.  Neurological: He is alert and oriented to person, place, and time.  Skin: Skin is warm and dry. No rash noted. No erythema.  Psychiatric: He has a normal mood and affect. His behavior is normal.   LABS: Results for orders placed or performed during the hospital encounter of 01/28/17 (from the past 48 hour(s))  Glucose, capillary     Status: Abnormal   Collection Time: 02/01/17 11:40 AM  Result Value Ref Range   Glucose-Capillary 268 (H) 65 - 99 mg/dL  Glucose, capillary     Status: Abnormal   Collection Time: 02/01/17  4:49 PM  Result Value Ref Range   Glucose-Capillary 120 (H) 65 - 99 mg/dL  Glucose, capillary     Status: Abnormal   Collection Time: 02/01/17  8:42 PM  Result Value Ref Range   Glucose-Capillary 107 (H) 65 - 99 mg/dL  Glucose, capillary     Status: Abnormal   Collection Time: 02/01/17  9:04 PM  Result Value Ref Range   Glucose-Capillary 125 (H) 65 - 99 mg/dL  Blood gas, arterial     Status: Abnormal   Collection Time: 02/01/17  9:09 PM  Result Value Ref Range   FIO2 0.32    Delivery systems NASAL CANNULA     Comment: 3L   pH, Arterial 7.45 7.350 - 7.450   pCO2 arterial 31 (L) 32.0 - 48.0 mmHg   pO2, Arterial 56 (L) 83.0 - 108.0 mmHg   Bicarbonate 22.0 20.0  - 28.0 mmol/L   Acid-base deficit 1.2 0.0 - 2.0 mmol/L   O2 Saturation 89.0 %   Patient temperature 37.9    Collection site LEFT RADIAL    Sample type ARTERIAL DRAW    Allens test (pass/fail) PASS PASS  CBC with Differential/Platelet     Status:  Abnormal   Collection Time: 02/01/17  9:51 PM  Result Value Ref Range   WBC 17.3 (H) 3.8 - 10.6 K/uL   RBC 4.15 (L) 4.40 - 5.90 MIL/uL   Hemoglobin 11.7 (L) 13.0 - 18.0 g/dL   HCT 34.4 (L) 40.0 - 52.0 %   MCV 82.9 80.0 - 100.0 fL   MCH 28.2 26.0 - 34.0 pg   MCHC 34.0 32.0 - 36.0 g/dL   RDW 17.3 (H) 11.5 - 14.5 %   Platelets 359 150 - 440 K/uL   Neutrophils Relative % 67 %   Lymphocytes Relative 14 %   Monocytes Relative 12 %   Eosinophils Relative 1 %   Basophils Relative 0 %   Band Neutrophils 6 %   Metamyelocytes Relative 0 %   Myelocytes 0 %   Promyelocytes Absolute 0 %   Blasts 0 %   nRBC 0 0 /100 WBC   Other 0 %   Neutro Abs 12.6 (H) 1.4 - 6.5 K/uL   Lymphs Abs 2.4 1.0 - 3.6 K/uL   Monocytes Absolute 2.1 (H) 0.2 - 1.0 K/uL   Eosinophils Absolute 0.2 0 - 0.7 K/uL   Basophils Absolute 0.0 0 - 0.1 K/uL   RBC Morphology MIXED RBC POPULATION   Comprehensive metabolic panel     Status: Abnormal   Collection Time: 02/01/17  9:51 PM  Result Value Ref Range   Sodium 133 (L) 135 - 145 mmol/L   Potassium 4.0 3.5 - 5.1 mmol/L   Chloride 99 (L) 101 - 111 mmol/L   CO2 24 22 - 32 mmol/L   Glucose, Bld 105 (H) 65 - 99 mg/dL   BUN 22 (H) 6 - 20 mg/dL   Creatinine, Ser 1.43 (H) 0.61 - 1.24 mg/dL   Calcium 8.4 (L) 8.9 - 10.3 mg/dL   Total Protein 5.7 (L) 6.5 - 8.1 g/dL   Albumin 3.2 (L) 3.5 - 5.0 g/dL   AST 78 (H) 15 - 41 U/L   ALT 84 (H) 17 - 63 U/L   Alkaline Phosphatase 84 38 - 126 U/L   Total Bilirubin 0.8 0.3 - 1.2 mg/dL   GFR calc non Af Amer 51 (L) >60 mL/min   GFR calc Af Amer 60 (L) >60 mL/min    Comment: (NOTE) The eGFR has been calculated using the CKD EPI equation. This calculation has not been validated in all clinical  situations. eGFR's persistently <60 mL/min signify possible Chronic Kidney Disease.    Anion gap 10 5 - 15  Lactic acid, plasma     Status: Abnormal   Collection Time: 02/01/17  9:51 PM  Result Value Ref Range   Lactic Acid, Venous 4.7 (HH) 0.5 - 1.9 mmol/L    Comment: CRITICAL RESULT CALLED TO, READ BACK BY AND VERIFIED WITH TESS THOMAS RN AT 2250 02/01/17 MSS.   Troponin I     Status: None   Collection Time: 02/01/17  9:51 PM  Result Value Ref Range   Troponin I <0.03 <0.03 ng/mL  Culture, blood (Routine X 2) w Reflex to ID Panel     Status: None (Preliminary result)   Collection Time: 02/01/17  9:51 PM  Result Value Ref Range   Specimen Description BLOOD BLOOD RIGHT ARM    Special Requests      BOTTLES DRAWN AEROBIC AND ANAEROBIC Blood Culture results may not be optimal due to an excessive volume of blood received in culture bottles   Culture NO GROWTH 2 DAYS  Report Status PENDING   Culture, blood (Routine X 2) w Reflex to ID Panel     Status: None (Preliminary result)   Collection Time: 02/01/17  9:51 PM  Result Value Ref Range   Specimen Description BLOOD BLOOD LEFT HAND    Special Requests      BOTTLES DRAWN AEROBIC AND ANAEROBIC Blood Culture adequate volume   Culture  Setup Time      Organism ID to follow IN BOTH AEROBIC AND ANAEROBIC BOTTLES GRAM POSITIVE COCCI CRITICAL RESULT CALLED TO, READ BACK BY AND VERIFIED WITH: Kaisley Stiverson BESANTI AT 2132 ON 02/02/17 BY SNJ    Culture GRAM POSITIVE COCCI    Report Status PENDING   Blood Culture ID Panel (Reflexed)     Status: Abnormal   Collection Time: 02/01/17  9:51 PM  Result Value Ref Range   Enterococcus species NOT DETECTED NOT DETECTED   Listeria monocytogenes NOT DETECTED NOT DETECTED   Staphylococcus species DETECTED (A) NOT DETECTED    Comment: CRITICAL RESULT CALLED TO, READ BACK BY AND VERIFIED WITH: Zeppelin Commisso BESANTI AT 2132 ON 02/02/17 BY SNJ    Staphylococcus aureus DETECTED (A) NOT DETECTED    Comment:  Methicillin (oxacillin)-resistant Staphylococcus aureus (MRSA). MRSA is predictably resistant to beta-lactam antibiotics (except ceftaroline). Preferred therapy is vancomycin unless clinically contraindicated. Patient requires contact precautions if  hospitalized. CRITICAL RESULT CALLED TO, READ BACK BY AND VERIFIED WITH: Kemoni Ortega BESANTI AT 2132 ON 02/02/17 BY SNJ    Methicillin resistance DETECTED (A) NOT DETECTED    Comment: CRITICAL RESULT CALLED TO, READ BACK BY AND VERIFIED WITH: Terriah Reggio BESANTI AT 2132 ON 02/02/17 BY SNJ    Streptococcus species NOT DETECTED NOT DETECTED   Streptococcus agalactiae NOT DETECTED NOT DETECTED   Streptococcus pneumoniae NOT DETECTED NOT DETECTED   Streptococcus pyogenes NOT DETECTED NOT DETECTED   Acinetobacter baumannii NOT DETECTED NOT DETECTED   Enterobacteriaceae species NOT DETECTED NOT DETECTED   Enterobacter cloacae complex NOT DETECTED NOT DETECTED   Escherichia coli NOT DETECTED NOT DETECTED   Klebsiella oxytoca NOT DETECTED NOT DETECTED   Klebsiella pneumoniae NOT DETECTED NOT DETECTED   Proteus species NOT DETECTED NOT DETECTED   Serratia marcescens NOT DETECTED NOT DETECTED   Haemophilus influenzae NOT DETECTED NOT DETECTED   Neisseria meningitidis NOT DETECTED NOT DETECTED   Pseudomonas aeruginosa NOT DETECTED NOT DETECTED   Candida albicans NOT DETECTED NOT DETECTED   Candida glabrata NOT DETECTED NOT DETECTED   Candida krusei NOT DETECTED NOT DETECTED   Candida parapsilosis NOT DETECTED NOT DETECTED   Candida tropicalis NOT DETECTED NOT DETECTED  Glucose, capillary     Status: Abnormal   Collection Time: 02/01/17 11:05 PM  Result Value Ref Range   Glucose-Capillary 153 (H) 65 - 99 mg/dL  MRSA PCR Screening     Status: Abnormal   Collection Time: 02/01/17 11:28 PM  Result Value Ref Range   MRSA by PCR POSITIVE (A) NEGATIVE    Comment:        The GeneXpert MRSA Assay (FDA approved for NASAL specimens only), is one component of  a comprehensive MRSA colonization surveillance program. It is not intended to diagnose MRSA infection nor to guide or monitor treatment for MRSA infections. RESULT CALLED TO, READ BACK BY AND VERIFIED WITH: TESS THOMAS @ 0112 ON 02/02/2017 BY CAF   Procalcitonin - Baseline     Status: None   Collection Time: 02/01/17 11:30 PM  Result Value Ref Range   Procalcitonin 16.40 ng/mL  Comment:        Interpretation: PCT >= 10 ng/mL: Important systemic inflammatory response, almost exclusively due to severe bacterial sepsis or septic shock. (NOTE)         ICU PCT Algorithm               Non ICU PCT Algorithm    ----------------------------     ------------------------------         PCT < 0.25 ng/mL                 PCT < 0.1 ng/mL     Stopping of antibiotics            Stopping of antibiotics       strongly encouraged.               strongly encouraged.    ----------------------------     ------------------------------       PCT level decrease by               PCT < 0.25 ng/mL       >= 80% from peak PCT       OR PCT 0.25 - 0.5 ng/mL          Stopping of antibiotics                                             encouraged.     Stopping of antibiotics           encouraged.    ----------------------------     ------------------------------       PCT level decrease by              PCT >= 0.25 ng/mL       < 80% from peak PCT        AND PCT >= 0.5 ng/mL             Continuing antibiotics                                              encouraged.       Continuing antibiotics            encouraged.    ----------------------------     ------------------------------     PCT level increase compared          PCT > 0.5 ng/mL         with peak PCT AND          PCT >= 0.5 ng/mL             Escalation of antibiotics                                          strongly encouraged.      Escalation of antibiotics        strongly encouraged.   Culture, expectorated sputum-assessment     Status: None    Collection Time: 02/02/17  2:27 AM  Result Value Ref Range   Specimen Description SPUTUM    Special Requests NONE    Sputum evaluation THIS SPECIMEN IS ACCEPTABLE FOR SPUTUM CULTURE    Report Status  02/02/2017 FINAL   Culture, respiratory (NON-Expectorated)     Status: None (Preliminary result)   Collection Time: 02/02/17  2:27 AM  Result Value Ref Range   Specimen Description SPUTUM    Special Requests NONE Reflexed from [id number]    Gram Stain      ABUNDANT WBC PRESENT,BOTH PMN AND MONONUCLEAR ABUNDANT GRAM POSITIVE COCCI IN PAIRS IN CLUSTERS MODERATE GRAM VARIABLE ROD    Culture PENDING    Report Status PENDING   Magnesium     Status: Abnormal   Collection Time: 02/02/17  7:23 AM  Result Value Ref Range   Magnesium 1.5 (L) 1.7 - 2.4 mg/dL  Phosphorus     Status: Abnormal   Collection Time: 02/02/17  7:23 AM  Result Value Ref Range   Phosphorus 5.2 (H) 2.5 - 4.6 mg/dL  Procalcitonin     Status: None   Collection Time: 02/02/17  7:23 AM  Result Value Ref Range   Procalcitonin 31.85 ng/mL    Comment:        Interpretation: PCT >= 10 ng/mL: Important systemic inflammatory response, almost exclusively due to severe bacterial sepsis or septic shock. (NOTE)         ICU PCT Algorithm               Non ICU PCT Algorithm    ----------------------------     ------------------------------         PCT < 0.25 ng/mL                 PCT < 0.1 ng/mL     Stopping of antibiotics            Stopping of antibiotics       strongly encouraged.               strongly encouraged.    ----------------------------     ------------------------------       PCT level decrease by               PCT < 0.25 ng/mL       >= 80% from peak PCT       OR PCT 0.25 - 0.5 ng/mL          Stopping of antibiotics                                             encouraged.     Stopping of antibiotics           encouraged.    ----------------------------     ------------------------------       PCT level decrease by               PCT >= 0.25 ng/mL       < 80% from peak PCT        AND PCT >= 0.5 ng/mL             Continuing antibiotics                                              encouraged.       Continuing antibiotics            encouraged.    ----------------------------     ------------------------------  PCT level increase compared          PCT > 0.5 ng/mL         with peak PCT AND          PCT >= 0.5 ng/mL             Escalation of antibiotics                                          strongly encouraged.      Escalation of antibiotics        strongly encouraged.   Glucose, capillary     Status: None   Collection Time: 02/02/17  7:50 AM  Result Value Ref Range   Glucose-Capillary 76 65 - 99 mg/dL  Glucose, capillary     Status: None   Collection Time: 02/02/17 12:00 PM  Result Value Ref Range   Glucose-Capillary 65 65 - 99 mg/dL  Troponin I     Status: None   Collection Time: 02/02/17  1:42 PM  Result Value Ref Range   Troponin I <0.03 <0.03 ng/mL  Glucose, capillary     Status: None   Collection Time: 02/02/17  3:33 PM  Result Value Ref Range   Glucose-Capillary 74 65 - 99 mg/dL  C difficile quick scan w PCR reflex     Status: None   Collection Time: 02/02/17  9:10 PM  Result Value Ref Range   C Diff antigen NEGATIVE NEGATIVE   C Diff toxin NEGATIVE NEGATIVE   C Diff interpretation No C. difficile detected.   Glucose, capillary     Status: Abnormal   Collection Time: 02/02/17  9:42 PM  Result Value Ref Range   Glucose-Capillary 107 (H) 65 - 99 mg/dL  Vancomycin, trough     Status: Abnormal   Collection Time: 02/02/17 10:21 PM  Result Value Ref Range   Vancomycin Tr 10 (L) 15 - 20 ug/mL  Procalcitonin     Status: None   Collection Time: 02/03/17  3:17 AM  Result Value Ref Range   Procalcitonin 40.86 ng/mL    Comment:        Interpretation: PCT >= 10 ng/mL: Important systemic inflammatory response, almost exclusively due to severe bacterial sepsis or septic  shock. (NOTE)         ICU PCT Algorithm               Non ICU PCT Algorithm    ----------------------------     ------------------------------         PCT < 0.25 ng/mL                 PCT < 0.1 ng/mL     Stopping of antibiotics            Stopping of antibiotics       strongly encouraged.               strongly encouraged.    ----------------------------     ------------------------------       PCT level decrease by               PCT < 0.25 ng/mL       >= 80% from peak PCT       OR PCT 0.25 - 0.5 ng/mL          Stopping of antibiotics  encouraged.     Stopping of antibiotics           encouraged.    ----------------------------     ------------------------------       PCT level decrease by              PCT >= 0.25 ng/mL       < 80% from peak PCT        AND PCT >= 0.5 ng/mL             Continuing antibiotics                                              encouraged.       Continuing antibiotics            encouraged.    ----------------------------     ------------------------------     PCT level increase compared          PCT > 0.5 ng/mL         with peak PCT AND          PCT >= 0.5 ng/mL             Escalation of antibiotics                                          strongly encouraged.      Escalation of antibiotics        strongly encouraged.   Vancomycin, random     Status: None   Collection Time: 02/03/17  3:17 AM  Result Value Ref Range   Vancomycin Rm 19     Comment:        Random Vancomycin therapeutic range is dependent on dosage and time of specimen collection. A peak range is 20.0-40.0 ug/mL A trough range is 5.0-15.0 ug/mL          Creatinine, serum     Status: None   Collection Time: 02/03/17  4:09 AM  Result Value Ref Range   Creatinine, Ser 0.85 0.61 - 1.24 mg/dL   GFR calc non Af Amer >60 >60 mL/min   GFR calc Af Amer >60 >60 mL/min    Comment: (NOTE) The eGFR has been calculated using the CKD EPI equation. This  calculation has not been validated in all clinical situations. eGFR's persistently <60 mL/min signify possible Chronic Kidney Disease.   Glucose, capillary     Status: None   Collection Time: 02/03/17  7:09 AM  Result Value Ref Range   Glucose-Capillary 77 65 - 99 mg/dL   No components found for: ESR, C REACTIVE PROTEIN MICRO: Recent Results (from the past 720 hour(s))  Culture, blood (Routine X 2) w Reflex to ID Panel     Status: None (Preliminary result)   Collection Time: 02/01/17  9:51 PM  Result Value Ref Range Status   Specimen Description BLOOD BLOOD RIGHT ARM  Final   Special Requests   Final    BOTTLES DRAWN AEROBIC AND ANAEROBIC Blood Culture results may not be optimal due to an excessive volume of blood received in culture bottles   Culture NO GROWTH 2 DAYS  Final   Report Status PENDING  Incomplete  Culture, blood (Routine X 2) w Reflex to ID Panel  Status: None (Preliminary result)   Collection Time: 02/01/17  9:51 PM  Result Value Ref Range Status   Specimen Description BLOOD BLOOD LEFT HAND  Final   Special Requests   Final    BOTTLES DRAWN AEROBIC AND ANAEROBIC Blood Culture adequate volume   Culture  Setup Time   Final    Organism ID to follow IN BOTH AEROBIC AND ANAEROBIC BOTTLES GRAM POSITIVE COCCI CRITICAL RESULT CALLED TO, READ BACK BY AND VERIFIED WITH: Shameria Trimarco BESANTI AT 2132 ON 02/02/17 BY SNJ    Culture GRAM POSITIVE COCCI  Final   Report Status PENDING  Incomplete  Blood Culture ID Panel (Reflexed)     Status: Abnormal   Collection Time: 02/01/17  9:51 PM  Result Value Ref Range Status   Enterococcus species NOT DETECTED NOT DETECTED Final   Listeria monocytogenes NOT DETECTED NOT DETECTED Final   Staphylococcus species DETECTED (A) NOT DETECTED Final    Comment: CRITICAL RESULT CALLED TO, READ BACK BY AND VERIFIED WITH: Lekeisha Arenas BESANTI AT 2132 ON 02/02/17 BY SNJ    Staphylococcus aureus DETECTED (A) NOT DETECTED Final    Comment: Methicillin  (oxacillin)-resistant Staphylococcus aureus (MRSA). MRSA is predictably resistant to beta-lactam antibiotics (except ceftaroline). Preferred therapy is vancomycin unless clinically contraindicated. Patient requires contact precautions if  hospitalized. CRITICAL RESULT CALLED TO, READ BACK BY AND VERIFIED WITH: Pasqual Farias BESANTI AT 2132 ON 02/02/17 BY SNJ    Methicillin resistance DETECTED (A) NOT DETECTED Final    Comment: CRITICAL RESULT CALLED TO, READ BACK BY AND VERIFIED WITH: Vaneta Hammontree BESANTI AT 2132 ON 02/02/17 BY SNJ    Streptococcus species NOT DETECTED NOT DETECTED Final   Streptococcus agalactiae NOT DETECTED NOT DETECTED Final   Streptococcus pneumoniae NOT DETECTED NOT DETECTED Final   Streptococcus pyogenes NOT DETECTED NOT DETECTED Final   Acinetobacter baumannii NOT DETECTED NOT DETECTED Final   Enterobacteriaceae species NOT DETECTED NOT DETECTED Final   Enterobacter cloacae complex NOT DETECTED NOT DETECTED Final   Escherichia coli NOT DETECTED NOT DETECTED Final   Klebsiella oxytoca NOT DETECTED NOT DETECTED Final   Klebsiella pneumoniae NOT DETECTED NOT DETECTED Final   Proteus species NOT DETECTED NOT DETECTED Final   Serratia marcescens NOT DETECTED NOT DETECTED Final   Haemophilus influenzae NOT DETECTED NOT DETECTED Final   Neisseria meningitidis NOT DETECTED NOT DETECTED Final   Pseudomonas aeruginosa NOT DETECTED NOT DETECTED Final   Candida albicans NOT DETECTED NOT DETECTED Final   Candida glabrata NOT DETECTED NOT DETECTED Final   Candida krusei NOT DETECTED NOT DETECTED Final   Candida parapsilosis NOT DETECTED NOT DETECTED Final   Candida tropicalis NOT DETECTED NOT DETECTED Final  MRSA PCR Screening     Status: Abnormal   Collection Time: 02/01/17 11:28 PM  Result Value Ref Range Status   MRSA by PCR POSITIVE (A) NEGATIVE Final    Comment:        The GeneXpert MRSA Assay (FDA approved for NASAL specimens only), is one component of a comprehensive MRSA  colonization surveillance program. It is not intended to diagnose MRSA infection nor to guide or monitor treatment for MRSA infections. RESULT CALLED TO, READ BACK BY AND VERIFIED WITH: TESS THOMAS @ 0112 ON 02/02/2017 BY CAF   Culture, expectorated sputum-assessment     Status: None   Collection Time: 02/02/17  2:27 AM  Result Value Ref Range Status   Specimen Description SPUTUM  Final   Special Requests NONE  Final   Sputum evaluation THIS SPECIMEN IS  ACCEPTABLE FOR SPUTUM CULTURE  Final   Report Status 02/02/2017 FINAL  Final  Culture, respiratory (NON-Expectorated)     Status: None (Preliminary result)   Collection Time: 02/02/17  2:27 AM  Result Value Ref Range Status   Specimen Description SPUTUM  Final   Special Requests NONE Reflexed from [id number]  Final   Gram Stain   Final    ABUNDANT WBC PRESENT,BOTH PMN AND MONONUCLEAR ABUNDANT GRAM POSITIVE COCCI IN PAIRS IN CLUSTERS MODERATE GRAM VARIABLE ROD    Culture PENDING  Incomplete   Report Status PENDING  Incomplete  C difficile quick scan w PCR reflex     Status: None   Collection Time: 02/02/17  9:10 PM  Result Value Ref Range Status   C Diff antigen NEGATIVE NEGATIVE Final   C Diff toxin NEGATIVE NEGATIVE Final   C Diff interpretation No C. difficile detected.  Final    IMAGING: Dg Chest 2 View  Result Date: 01/28/2017 CLINICAL DATA:  Shortness of Breath EXAM: CHEST  2 VIEW COMPARISON:  January 24, 2017 and January 14, 2017 FINDINGS: Lungs are somewhat hyperexpanded. There is no edema or consolidation. Relative lucency in the upper lobes is stable and likely represents bullous disease. Interstitial prominence in other parts the lungs in part may be due to redistribution of blood flow to viable lung segments. There is no frank edema or consolidation. The heart size is normal. Mildly diminished pulmonary vascularity in the upper lobes may reflect bullous disease. No adenopathy. There is degenerative change in the thoracic spine.  IMPRESSION: Findings indicative of a degree of bullous disease in the upper lobes. No edema or consolidation. Stable cardiac silhouette. No adenopathy evident. Electronically Signed   By: Lowella Grip III M.D.   On: 01/28/2017 12:11   Dg Chest 2 View  Result Date: 01/24/2017 CLINICAL DATA:  Shortness of breath and mid chest tightness since this morning. History of CHF, coronary artery disease with stent placement, COPD, current smoker. EXAM: CHEST  2 VIEW COMPARISON:  Chest x-ray of January 14, 2017 FINDINGS: The lungs are hyperinflated with hemidiaphragm flattening. The heart and pulmonary vascularity are normal. The mediastinum is normal in width. There is no pleural effusion. There is calcification in the wall of the aortic arch. There are old deformities of the lateral aspects of the left fifth and sixth ribs. IMPRESSION: COPD. No pneumonia, CHF, nor other acute cardiopulmonary abnormality. Thoracic aortic atherosclerosis. Electronically Signed   By: Foxx Klarich  [place] M.D.   On: 01/24/2017 11:35   Dg Chest Port 1 View  Result Date: 02/01/2017 CLINICAL DATA:  Cough. EXAM: PORTABLE CHEST 1 VIEW COMPARISON:  Radiographs of January 28, 2017. FINDINGS: Stable cardiomediastinal silhouette. No pneumothorax is noted. Old left rib fractures are noted. There is interval development of patchy opacities throughout the left lung most consistent with pneumonia. New focal opacity is noted in right midlung most consistent with pneumonia. No significant pleural effusion is noted. IMPRESSION: Interval development of patchy opacities throughout the left lung as well as focal opacity in right midlung most consistent with pneumonia. Followup PA and lateral chest X-ray is recommended in 3-4 weeks following trial of antibiotic therapy to ensure resolution and exclude underlying malignancy. Electronically Signed   By: Marijo Conception, M.D.   On: 02/01/2017 21:37   Dg Chest Port 1 View  Result Date: 01/14/2017 CLINICAL DATA:   Shortness of Breath EXAM: PORTABLE CHEST 1 VIEW COMPARISON:  01/07/2017 FINDINGS: Cardiac shadow is within normal limits. The lungs are  hyperaerated bilaterally. No focal infiltrate or sizable is seen. No bony abnormality is. IMPRESSION: COPD without acute abnormality. Electronically Signed   By: Inez Catalina M.D.   On: 01/14/2017 19:07   Dg Chest Port 1 View  Result Date: 01/07/2017 CLINICAL DATA:  Shortness of breath EXAM: PORTABLE CHEST 1 VIEW COMPARISON:  09/05/2016 FINDINGS: Chronic hyperinflation. Mild streaky atelectasis at both bases. There is no edema, consolidation, effusion, or pneumothorax. Normal heart size and stable aortic tortuosity. Remote left rib fractures. IMPRESSION: COPD with mild atelectasis at the bases. Electronically Signed   By: Monte Fantasia M.D.   On: 01/07/2017 16:22    Assessment:   Kristopher Powell is a 60 y.o. male with end stage COPD and recurrent admissions for hypoxia now with MRSA PNA, and bacteremia. No line in place at time of bacteremia.  He is in ICU but fevers improving. Remains on high O2 rate, tachypneic.  He has bacteremia with pulmonary source.  Risk of endocarditis is low given acute onset with rapid initiation of treatment.  Recommendations Repeat bcx  Check echo.  Very high risk for TEE given advanced COPD. Once bcx cleared for 48 hours can place picc Will need 2-4 weeks minimum IV vanco for the MRSA bacteremia.  Thank you very much for allowing me to participate in the care of this patient. Please call with questions.   Cheral Marker. Ola Spurr, MD

## 2017-02-04 ENCOUNTER — Inpatient Hospital Stay: Payer: Medicaid Other

## 2017-02-04 LAB — CBC
HCT: 27.3 % — ABNORMAL LOW (ref 40.0–52.0)
HEMOGLOBIN: 9.3 g/dL — AB (ref 13.0–18.0)
MCH: 27.9 pg (ref 26.0–34.0)
MCHC: 34.2 g/dL (ref 32.0–36.0)
MCV: 81.6 fL (ref 80.0–100.0)
Platelets: 237 10*3/uL (ref 150–440)
RBC: 3.35 MIL/uL — ABNORMAL LOW (ref 4.40–5.90)
RDW: 17.2 % — AB (ref 11.5–14.5)
WBC: 22.2 10*3/uL — AB (ref 3.8–10.6)

## 2017-02-04 LAB — BASIC METABOLIC PANEL
ANION GAP: 7 (ref 5–15)
BUN: 18 mg/dL (ref 6–20)
CALCIUM: 7.6 mg/dL — AB (ref 8.9–10.3)
CO2: 26 mmol/L (ref 22–32)
Chloride: 100 mmol/L — ABNORMAL LOW (ref 101–111)
Creatinine, Ser: 0.76 mg/dL (ref 0.61–1.24)
GFR calc Af Amer: >60 mL/min (ref >60)
GLUCOSE: 84 mg/dL (ref 65–99)
POTASSIUM: 3.7 mmol/L (ref 3.5–5.1)
SODIUM: 133 mmol/L — AB (ref 135–145)

## 2017-02-04 LAB — GLUCOSE, CAPILLARY
GLUCOSE-CAPILLARY: 87 mg/dL (ref 65–99)
GLUCOSE-CAPILLARY: 134 mg/dL — AB (ref 65–99)
Glucose-Capillary: 158 mg/dL — ABNORMAL HIGH (ref 65–99)
Glucose-Capillary: 146 mg/dL — ABNORMAL HIGH (ref 65–99)

## 2017-02-04 LAB — VANCOMYCIN, TROUGH: VANCOMYCIN TR: 37 ug/mL — AB (ref 15–20)

## 2017-02-04 LAB — CULTURE, RESPIRATORY

## 2017-02-04 LAB — CULTURE, RESPIRATORY W GRAM STAIN

## 2017-02-04 MED ORDER — ADULT MULTIVITAMIN W/MINERALS CH
1.0000 | ORAL_TABLET | Freq: Every day | ORAL | Status: DC
  Administered 2017-02-04 – 2017-02-11 (×8): 1 via ORAL
  Filled 2017-02-04 (×8): qty 1

## 2017-02-04 MED ORDER — VITAMIN B-12 1000 MCG PO TABS
1000.0000 ug | ORAL_TABLET | Freq: Every day | ORAL | Status: DC
  Administered 2017-02-04 – 2017-02-11 (×8): 1000 ug via ORAL
  Filled 2017-02-04 (×8): qty 1

## 2017-02-04 MED ORDER — FOLIC ACID 1 MG PO TABS
1.0000 mg | ORAL_TABLET | Freq: Every day | ORAL | Status: DC
  Administered 2017-02-04 – 2017-02-11 (×8): 1 mg via ORAL
  Filled 2017-02-04 (×8): qty 1

## 2017-02-04 MED ORDER — MORPHINE SULFATE (PF) 2 MG/ML IV SOLN
1.0000 mg | Freq: Once | INTRAVENOUS | Status: AC
  Administered 2017-02-04: 1 mg via INTRAVENOUS
  Filled 2017-02-04: qty 1

## 2017-02-04 NOTE — Progress Notes (Signed)
Pharmacy Antibiotic Note  Kristopher ReeveHarmon L Powell is a 61 y.o. male admitted on 01/28/2017 with pneumonia.  Pharmacy has been consulted for vancomycin dosing. Patient is also ordered Unasyn.   06/22 16:00 Vanc trough resulted at 37 mcg/ml.  Level was drawn late, verified with RN that lab was drawn while Vancomycin dose infusing.   Plan: Will continue with current therapy and recheck trough level with AM dose.  Height: 6' (182.9 cm) Weight: 175 lb 4.3 oz (79.5 kg) IBW/kg (Calculated) : 77.6  Temp (24hrs), Avg:98.4 F (36.9 C), Min:97.7 F (36.5 C), Max:99.2 F (37.3 C)   Recent Labs Lab 01/29/17 0411 01/31/17 0448 02/01/17 2151  02/03/17 0317 02/03/17 0409 02/03/17 1328 02/04/17 0453 02/04/17 1659  WBC 15.8*  --  17.3*  --   --   --   --  22.2*  --   CREATININE 0.65 0.61 1.43*  --   --  0.85  --  0.76  --   LATICACIDVEN  --   --  4.7*  --   --   --   --   --   --   VANCOTROUGH  --   --   --   < >  --   --  18  --  37*  VANCORANDOM  --   --   --   --  19  --   --   --   --   < > = values in this interval not displayed.  Estimated Creatinine Clearance: 106.4 mL/min (by C-G formula based on SCr of 0.76 mg/dL).    No Known Allergies   Thank you for allowing pharmacy to be a part of this patient's care.  Clovia CuffLisa Averi Kilty, PharmD, BCPS 02/04/2017 5:50 PM

## 2017-02-04 NOTE — Progress Notes (Signed)
[facility] INFECTIOUS DISEASE PROGRESS NOTE Date of Admission:  01/28/2017     ID: Kristopher Powell is a 61 y.o. male with MRSA pna and bacteremia Active Problems:   COPD exacerbation (HCC)   Weakness   Subjective: Remains in unit, not on pressors but significant O2 requirement. Seems more confused  ROS  Unable to obtain   Medications:  Antibiotics Given (last 72 hours)    Date/Time Action Medication Dose Rate   02/01/17 2219 New Bag/Given   vancomycin (VANCOCIN) IVPB 1000 mg/200 mL premix 1,000 mg 200 mL/hr   02/01/17 2219 New Bag/Given   piperacillin-tazobactam (ZOSYN) IVPB 3.375 g 3.375 g 12.5 mL/hr   02/02/17 0419 New Bag/Given   vancomycin (VANCOCIN) IVPB 1000 mg/200 mL premix 1,000 mg 200 mL/hr   02/02/17 0631 New Bag/Given   ampicillin-sulbactam (UNASYN) 1.5 g in sodium chloride 0.9 % 50 mL IVPB 1.5 g 100 mL/hr   02/02/17 1031 New Bag/Given   vancomycin (VANCOCIN) IVPB 1000 mg/200 mL premix 1,000 mg 200 mL/hr   02/02/17 1210 New Bag/Given   ampicillin-sulbactam (UNASYN) 1.5 g in sodium chloride 0.9 % 50 mL IVPB 1.5 g 100 mL/hr   02/02/17 1738 New Bag/Given   ampicillin-sulbactam (UNASYN) 1.5 g in sodium chloride 0.9 % 50 mL IVPB 1.5 g 100 mL/hr   02/03/17 0045 New Bag/Given   vancomycin (VANCOCIN) IVPB 1000 mg/200 mL premix 1,000 mg 200 mL/hr   02/03/17 0156 New Bag/Given   ampicillin-sulbactam (UNASYN) 1.5 g in sodium chloride 0.9 % 50 mL IVPB 1.5 g 100 mL/hr   02/03/17 0726 New Bag/Given   ampicillin-sulbactam (UNASYN) 1.5 g in sodium chloride 0.9 % 50 mL IVPB 1.5 g 100 mL/hr   02/03/17 1610 New Bag/Given   vancomycin (VANCOCIN) IVPB 1000 mg/200 mL premix 1,000 mg 200 mL/hr   02/03/17 1428 New Bag/Given   ampicillin-sulbactam (UNASYN) 1.5 g in sodium chloride 0.9 % 50 mL IVPB 1.5 g 100 mL/hr   02/03/17 1625 New Bag/Given   vancomycin (VANCOCIN) IVPB 1000 mg/200 mL premix 1,000 mg 200 mL/hr   02/03/17 1919 New Bag/Given   ampicillin-sulbactam (UNASYN) 1.5 g in  sodium chloride 0.9 % 50 mL IVPB 1.5 g 100 mL/hr   02/03/17 2345 New Bag/Given   vancomycin (VANCOCIN) IVPB 1000 mg/200 mL premix 1,000 mg 200 mL/hr   02/04/17 0125 New Bag/Given   ampicillin-sulbactam (UNASYN) 1.5 g in sodium chloride 0.9 % 50 mL IVPB 1.5 g 100 mL/hr   02/04/17 0845 New Bag/Given   vancomycin (VANCOCIN) IVPB 1000 mg/200 mL premix 1,000 mg 200 mL/hr   02/04/17 0854 New Bag/Given   ampicillin-sulbactam (UNASYN) 1.5 g in sodium chloride 0.9 % 50 mL IVPB 1.5 g 100 mL/hr     . aspirin EC  325 mg Oral Daily  . budesonide (PULMICORT) nebulizer solution  0.25 mg Nebulization Q6H  . Chlorhexidine Gluconate Cloth  6 each Topical Q0600  . enoxaparin (LOVENOX) injection  40 mg Subcutaneous Q24H  . folic acid  1 mg Oral Daily  . gabapentin  1,200 mg Oral TID  . imipramine  200 mg Oral QHS  . insulin aspart  0-15 Units Subcutaneous TID WC  . insulin aspart  0-5 Units Subcutaneous QHS  . ipratropium-albuterol  3 mL Nebulization Q6H  . metoprolol succinate  25 mg Oral Daily  . multivitamin with minerals  1 tablet Oral Daily  . mupirocin ointment  1 application Nasal BID  . pantoprazole  40 mg Oral Daily  . predniSONE  40  mg Oral Q breakfast  . risperiDONE  4 mg Oral QHS  . sodium chloride flush  3 mL Intravenous Q12H  . vitamin B-12  1,000 mcg Oral Daily    Objective: Vital signs in last 24 hours: Temp:  [97.7 F (36.5 C)-99.5 F (37.5 C)] 99.5 F (37.5 C) (06/22 1200) Pulse Rate:  [88-111] 94 (06/22 1400) Resp:  [14-35] 23 (06/22 1400) BP: (78-126)/(52-97) 96/56 (06/22 1400) SpO2:  [86 %-99 %] 94 % (06/22 1400) FiO2 (%):  [35 %-92 %] 45 % (06/22 0807) Weight:  [79.5 kg (175 lb 4.3 oz)] 79.5 kg (175 lb 4.3 oz) (06/22 0500) Constitutional: frail,chronically ill appearing.  Lying in bed, confused, tachypneic, disheveled HENT:  anicteric Mouth/Throat: Oropharynx is clear and dry. Poor dentition. No oropharyngeal exudate.  Cardiovascular: Normal rate, regular rhythm and  normal heart sounds. Pulmonary/Chest: poor air movement. Rhonchi throughout Abdominal: Soft. Bowel sounds are normal. He exhibits no distension. There is no tenderness.  Lymphadenopathy: He has no cervical adenopathy.  Neurological: more confused Skin: Skin is warm and dry. No rash noted. No erythema.  Psychiatric: confused Lab Results  Recent Labs  02/01/17 2151 02/03/17 0409 02/04/17 0453  WBC 17.3*  --  22.2*  HGB 11.7*  --  9.3*  HCT 34.4*  --  27.3*  NA 133*  --  133*  K 4.0  --  3.7  CL 99*  --  100*  CO2 24  --  26  BUN 22*  --  18  CREATININE 1.43* 0.85 0.76    Microbiology: Results for orders placed or performed during the hospital encounter of 01/28/17  Culture, blood (Routine X 2) w Reflex to ID Panel     Status: None (Preliminary result)   Collection Time: 02/01/17  9:51 PM  Result Value Ref Range Status   Specimen Description BLOOD BLOOD RIGHT ARM  Final   Special Requests   Final    BOTTLES DRAWN AEROBIC AND ANAEROBIC Blood Culture results may not be optimal due to an excessive volume of blood received in culture bottles   Culture NO GROWTH 3 DAYS  Final   Report Status PENDING  Incomplete  Culture, blood (Routine X 2) w Reflex to ID Panel     Status: Abnormal (Preliminary result)   Collection Time: 02/01/17  9:51 PM  Result Value Ref Range Status   Specimen Description BLOOD BLOOD LEFT HAND  Final   Special Requests   Final    BOTTLES DRAWN AEROBIC AND ANAEROBIC Blood Culture adequate volume   Culture  Setup Time   Final    IN BOTH AEROBIC AND ANAEROBIC BOTTLES GRAM POSITIVE COCCI CRITICAL RESULT CALLED TO, READ BACK BY AND VERIFIED WITH: Konstantina Nachreiner BESANTI AT 2132 ON 02/02/17 BY SNJ    Culture (A)  Final    STAPHYLOCOCCUS AUREUS SUSCEPTIBILITIES TO FOLLOW Performed at [facility], [address] [place], [place] [address]    Report Status PENDING  Incomplete  Blood Culture ID Panel (Reflexed)     Status: Abnormal   Collection Time: 02/01/17  9:51  PM  Result Value Ref Range Status   Enterococcus species NOT DETECTED NOT DETECTED Final   Listeria monocytogenes NOT DETECTED NOT DETECTED Final   Staphylococcus species DETECTED (A) NOT DETECTED Final    Comment: CRITICAL RESULT CALLED TO, READ BACK BY AND VERIFIED WITH: Raetta Agostinelli BESANTI AT 2132 ON 02/02/17 BY SNJ    Staphylococcus aureus DETECTED (A) NOT DETECTED Final    Comment: Methicillin (oxacillin)-resistant  Staphylococcus aureus (MRSA). MRSA is predictably resistant to beta-lactam antibiotics (except ceftaroline). Preferred therapy is vancomycin unless clinically contraindicated. Patient requires contact precautions if  hospitalized. CRITICAL RESULT CALLED TO, READ BACK BY AND VERIFIED WITH: Tahlia Deamer BESANTI AT 2132 ON 02/02/17 BY SNJ    Methicillin resistance DETECTED (A) NOT DETECTED Final    Comment: CRITICAL RESULT CALLED TO, READ BACK BY AND VERIFIED WITH: Zhana Jeangilles BESANTI AT 2132 ON 02/02/17 BY SNJ    Streptococcus species NOT DETECTED NOT DETECTED Final   Streptococcus agalactiae NOT DETECTED NOT DETECTED Final   Streptococcus pneumoniae NOT DETECTED NOT DETECTED Final   Streptococcus pyogenes NOT DETECTED NOT DETECTED Final   Acinetobacter baumannii NOT DETECTED NOT DETECTED Final   Enterobacteriaceae species NOT DETECTED NOT DETECTED Final   Enterobacter cloacae complex NOT DETECTED NOT DETECTED Final   Escherichia coli NOT DETECTED NOT DETECTED Final   Klebsiella oxytoca NOT DETECTED NOT DETECTED Final   Klebsiella pneumoniae NOT DETECTED NOT DETECTED Final   Proteus species NOT DETECTED NOT DETECTED Final   Serratia marcescens NOT DETECTED NOT DETECTED Final   Haemophilus influenzae NOT DETECTED NOT DETECTED Final   Neisseria meningitidis NOT DETECTED NOT DETECTED Final   Pseudomonas aeruginosa NOT DETECTED NOT DETECTED Final   Candida albicans NOT DETECTED NOT DETECTED Final   Candida glabrata NOT DETECTED NOT DETECTED Final   Candida krusei NOT DETECTED NOT DETECTED  Final   Candida parapsilosis NOT DETECTED NOT DETECTED Final   Candida tropicalis NOT DETECTED NOT DETECTED Final  MRSA PCR Screening     Status: Abnormal   Collection Time: 02/01/17 11:28 PM  Result Value Ref Range Status   MRSA by PCR POSITIVE (A) NEGATIVE Final    Comment:        The GeneXpert MRSA Assay (FDA approved for NASAL specimens only), is one component of a comprehensive MRSA colonization surveillance program. It is not intended to diagnose MRSA infection nor to guide or monitor treatment for MRSA infections. RESULT CALLED TO, READ BACK BY AND VERIFIED WITH: TESS THOMAS @ 0112 ON 02/02/2017 BY CAF   Culture, expectorated sputum-assessment     Status: None   Collection Time: 02/02/17  2:27 AM  Result Value Ref Range Status   Specimen Description SPUTUM  Final   Special Requests NONE  Final   Sputum evaluation THIS SPECIMEN IS ACCEPTABLE FOR SPUTUM CULTURE  Final   Report Status 02/02/2017 FINAL  Final  Culture, respiratory (NON-Expectorated)     Status: None   Collection Time: 02/02/17  2:27 AM  Result Value Ref Range Status   Specimen Description SPUTUM  Final   Special Requests NONE Reflexed from [id number]  Final   Gram Stain   Final    ABUNDANT WBC PRESENT,BOTH PMN AND MONONUCLEAR ABUNDANT GRAM POSITIVE COCCI IN PAIRS IN CLUSTERS MODERATE GRAM VARIABLE ROD    Culture   Final    ABUNDANT METHICILLIN RESISTANT STAPHYLOCOCCUS AUREUS   Report Status 02/04/2017 FINAL  Final   Organism ID, Bacteria METHICILLIN RESISTANT STAPHYLOCOCCUS AUREUS  Final      Susceptibility   Methicillin resistant staphylococcus aureus - MIC*    CIPROFLOXACIN >=8 RESISTANT Resistant     ERYTHROMYCIN >=8 RESISTANT Resistant     GENTAMICIN <=0.5 SENSITIVE Sensitive     OXACILLIN >=4 RESISTANT Resistant     TETRACYCLINE <=1 SENSITIVE Sensitive     VANCOMYCIN 1 SENSITIVE Sensitive     TRIMETH/SULFA <=10 SENSITIVE Sensitive     CLINDAMYCIN <=0.25 SENSITIVE Sensitive  RIFAMPIN <=0.5  SENSITIVE Sensitive     Inducible Clindamycin NEGATIVE Sensitive     * ABUNDANT METHICILLIN RESISTANT STAPHYLOCOCCUS AUREUS  C difficile quick scan w PCR reflex     Status: None   Collection Time: 02/02/17  9:10 PM  Result Value Ref Range Status   C Diff antigen NEGATIVE NEGATIVE Final   C Diff toxin NEGATIVE NEGATIVE Final   C Diff interpretation No C. difficile detected.  Final  Culture, blood (Routine X 2) w Reflex to ID Panel     Status: None (Preliminary result)   Collection Time: 02/03/17  1:28 PM  Result Value Ref Range Status   Specimen Description BLOOD RIGHT HAND  Final   Special Requests   Final    BOTTLES DRAWN AEROBIC AND ANAEROBIC Blood Culture results may not be optimal due to an inadequate volume of blood received in culture bottles   Culture NO GROWTH < 24 HOURS  Final   Report Status PENDING  Incomplete  Culture, blood (Routine X 2) w Reflex to ID Panel     Status: None (Preliminary result)   Collection Time: 02/03/17  1:36 PM  Result Value Ref Range Status   Specimen Description BLOOD RIGHT AC  Final   Special Requests   Final    BOTTLES DRAWN AEROBIC AND ANAEROBIC Blood Culture adequate volume   Culture NO GROWTH < 24 HOURS  Final   Report Status PENDING  Incomplete    Studies/Results: Dg Chest Port 1 View  Result Date: 02/04/2017 CLINICAL DATA:  Respiratory failure. EXAM: PORTABLE CHEST 1 VIEW COMPARISON:  02/01/2017.  CT 08/17/2016. FINDINGS: Heart size stable. Diffuse dense bilateral pulmonary infiltrates/edema. Interim significant progression from prior exam. No pleural effusion or pneumothorax. IMPRESSION: Diffuse dense bilateral pulmonary infiltrates/edema . Interim significant progression from prior exam . Electronically Signed   By: Maisie Fus  Register   On: 02/04/2017 06:24    Assessment/Plan: Kristopher Powell is a 61 y.o. male with end stage COPD and recurrent admissions for hypoxia now with MRSA PNA, and bacteremia. No line in place at time of bacteremia.   He is in ICU but fevers improving. Remains on high O2 rate, tachypneic.  He has bacteremia with pulmonary source.  Risk of endocarditis is low given acute onset with rapid initiation of treatment. Echo pending, Repeat bcx pending Recommendations Check echo.  Very high risk for TEE given advanced COPD. Once bcx cleared for 48 hours can place picc Will need 4 weeks IV vanco for the MRSA bacteremia. See IV abx order sheet Thank you very much for the consult. Will follow with you.  Lucile Didonato P   02/04/2017, 2:54 PM

## 2017-02-04 NOTE — Progress Notes (Signed)
[facility] [place] [facility]   PATIENT NAME: Kristopher Powell    MR#:  [id number]  DATE OF BIRTH:  1955/09/12  SUBJECTIVE:   Per RN patient has been constantly asking for food and fluids  CXR showed worsening pneumonia left lung today Currently off BiPAP.  REVIEW OF SYSTEMS:    Review of Systems  Constitutional: Positive for malaise/fatigue. Negative for chills and fever.  HENT: Negative for sore throat.   Eyes: Negative for blurred vision, double vision and pain.  Respiratory: Positive for cough and shortness of breath. Negative for hemoptysis.   Cardiovascular: Negative for chest pain, palpitations, orthopnea and leg swelling.  Gastrointestinal: Negative for abdominal pain, constipation, diarrhea, heartburn, nausea and vomiting.  Genitourinary: Negative for dysuria and hematuria.  Musculoskeletal: Negative for back pain and joint pain.  Skin: Negative for rash.  Neurological: Positive for weakness. Negative for sensory change, speech change, focal weakness and headaches.  Endo/Heme/Allergies: Does not bruise/bleed easily.  Psychiatric/Behavioral: Negative for depression. The patient is nervous/anxious.     DRUG ALLERGIES:  No Known Allergies  VITALS:  Blood pressure (!) 93/58, pulse 92, temperature 98 F (36.7 C), temperature source Oral, resp. rate (!) 23, height 6' (1.829 m), weight 79.5 kg (175 lb 4.3 oz), SpO2 90 %.  PHYSICAL EXAMINATION:   Physical Exam  GENERAL:  61 y.o.-year-old patient lying in the bed with no acute distress. Appears chronically ill EYES: Pupils equal, round, reactive to light and accommodation. No scleral icterus. Extraocular muscles intact.  HEENT: Head atraumatic, normocephalic. Oropharynx and nasopharynx clear.  NECK:  Supple, no jugular venous distention. No thyroid enlargement, no tenderness.  LUNGS: Decreased air entry with bilateral expiratory wheezing CARDIOVASCULAR: S1, S2 normal. No murmurs, rubs, or gallops.   ABDOMEN: Soft, nontender, nondistended. Bowel sounds present. No organomegaly or mass.  EXTREMITIES: No cyanosis, clubbing or edema b/l.    NEUROLOGIC: Cranial nerves II through XII are intact. No focal Motor or sensory deficits b/l.   PSYCHIATRIC: The patient is alert and awake. Anxious SKIN: No obvious rash, lesion, or ulcer.  Tattoos ++  LABORATORY PANEL:   CBC  Recent Labs Lab 02/04/17 0453  WBC 22.2*  HGB 9.3*  HCT 27.3*  PLT 237   ------------------------------------------------------------------------------------------------------------------ Chemistries   Recent Labs Lab 02/01/17 2151  02/03/17 1328 02/04/17 0453  NA 133*  --   --  133*  K 4.0  --   --  3.7  CL 99*  --   --  100*  CO2 24  --   --  26  GLUCOSE 105*  --   --  84  BUN 22*  --   --  18  CREATININE 1.43*  < >  --  0.76  CALCIUM 8.4*  --   --  7.6*  MG  --   < > 1.8  --   AST 78*  --   --   --   ALT 84*  --   --   --   ALKPHOS 84  --   --   --   BILITOT 0.8  --   --   --   < > = values in this interval not displayed. ------------------------------------------------------------------------------------------------------------------  Cardiac Enzymes  Recent Labs Lab 02/02/17 1342  TROPONINI <0.03   ------------------------------------------------------------------------------------------------------------------  RADIOLOGY:  Dg Chest Port 1 View  Result Date: 02/04/2017 CLINICAL DATA:  Respiratory failure. EXAM: PORTABLE CHEST 1 VIEW COMPARISON:  02/01/2017.  CT 08/17/2016. FINDINGS: Heart size stable. Diffuse dense bilateral  pulmonary infiltrates/edema. Interim significant progression from prior exam. No pleural effusion or pneumothorax. IMPRESSION: Diffuse dense bilateral pulmonary infiltrates/edema . Interim significant progression from prior exam . Electronically Signed   By: Maisie Fushomas  Register   On: 02/04/2017 06:24     ASSESSMENT AND PLAN:  Delrae SawyersHarmon Paragas  is a 61 y.o. male with a known  history of COPD, tobacco use, chronic respiratory failure on 4 L oxygen, neuropathy, GERD, depression presents to the emergency room due to worsening shortness of breath. His saturations were found to be 70% on room air. And 88% on 4 L oxygen. EMS gave him a dose of Solu-Medrol and nebulizer and brought to the emergency room. Here patient has received 2 additional nebulizer treatments  * Acute COPD exacerbation with acute on chronic respiratory failure due to ongoing tobacco use Shortness of breath on ambulation.  -po taper prednisone.  -prn nebulizer treatment. -CXR showed worsening PNA on left 6/22   * Left sided pneumonia MRSA with MRSA sepsis -IV vanc  For now. -d/c unasyn -sputum cx MRSA++ -MRSA PCR positive -BC 2/2 MRSA -consider TTE  * Chronic mild hyponatremia due to pulmonary issues. Is stable. He is also supposed to be on fluid restriction. Recent nephrology notes.  * h/o Diabetes mellitus Or hyperglycemia due to steroids. Recent hemoglobin A1c is 5.5.  * Hypertension. Continue home medications of Toprol and Norvasc.  * Tobacco abuse Counseled patient to quit On admission  * DVT prophylaxis with Lovenox  CSW for d/c planning---[facility]  All the records are reviewed and case discussed with Care Management/Social Worker Management plans discussed with the patient, family and they are in agreement.  CODE STATUS: Partial code  DVT Prophylaxis: SCDs  TOTAL TIME TAKING CARE OF THIS PATIENT:25 minutes.     Sharnese Heath M.D on 02/04/2017 at 3:56 PM  Between 7am to 6pm - Pager - [phone]  After 6pm go to [email or web address] - password [email or web address] [facility]  [facility]  Office  [phone]  CC: Primary care physician; Candie ChromanBarzin, Amir Homayoun, DO  Note: This dictation was prepared with Dragon dictation along with smaller phrase technology. Any transcriptional errors that result from this process are unintentional.

## 2017-02-04 NOTE — Progress Notes (Addendum)
   Name: Kristopher ReeveHarmon L Powell MRN: [id number] DOB: 10/02/1955    ADMISSION DATE:  01/28/2017  CONSULTATION DATE: 6/19  REFERRING MD :  Dr. Anne HahnWillis  CHIEF COMPLAINT: Altered mental status, Hypoxia  BRIEF PATIENT DESCRIPTION: 61 year old male initially admitted with COPD.  Now transferred from the floor due to altered mental status and hypoxia  due to Pneumonia  SIGNIFICANT EVENTS  6/15 Patient admitted with COPD Exacerbation 6/19 Patient transferred from the floor due to altered , mental status and hypoxia due to PNA  STUDIES:  None  SUBJECTIVE:  Pt states his breathing has improved he did require a brief period of Bipap overnight due to shortness of breath   VITAL SIGNS: Temp:  [97.7 F (36.5 C)-99.2 F (37.3 C)] 98.7 F (37.1 C) (06/22 0131) Pulse Rate:  [88-113] 92 (06/22 0600) Resp:  [14-35] 22 (06/22 0600) BP: (81-126)/(54-97) 98/70 (06/22 0600) SpO2:  [86 %-99 %] 99 % (06/22 0600) FiO2 (%):  [35 %-92 %] 45 % (06/22 0807) Weight:  [79.5 kg (175 lb 4.3 oz)] 79.5 kg (175 lb 4.3 oz) (06/22 0500)  PHYSICAL EXAMINATION: General:  61 year old male, on 6L, in no acute distress Neuro: follows commands, alert and oriented HEENT:  AT,[place],No JVD, Cardiovascular:  Tachycardic,s1s2,nom/r/g Lungs: expiratory wheezes and rhonchi right upper and middle lobe, diminished in all other lobes, even non labored  Abdomen: soft,NT,ND Musculoskeletal:  No edema,cyanosis  Skin:  Warm,dry and intact   Recent Labs Lab 01/31/17 0448 02/01/17 2151 02/03/17 0409 02/04/17 0453  NA 135 133*  --  133*  K 3.9 4.0  --  3.7  CL 101 99*  --  100*  CO2 27 24  --  26  BUN 16 22*  --  18  CREATININE 0.61 1.43* 0.85 0.76  GLUCOSE 96 105*  --  84    Recent Labs Lab 01/29/17 0411 02/01/17 2151 02/04/17 0453  HGB 11.7* 11.7* 9.3*  HCT 34.1* 34.4* 27.3*  WBC 15.8* 17.3* 22.2*  PLT 409 359 237   Dg Chest Port 1 View  Result Date: 02/04/2017 CLINICAL DATA:  Respiratory failure. EXAM: PORTABLE  CHEST 1 VIEW COMPARISON:  02/01/2017.  CT 08/17/2016. FINDINGS: Heart size stable. Diffuse dense bilateral pulmonary infiltrates/edema. Interim significant progression from prior exam. No pleural effusion or pneumothorax. IMPRESSION: Diffuse dense bilateral pulmonary infiltrates/edema . Interim significant progression from prior exam . Electronically Signed   By: Maisie Fushomas  Register   On: 02/04/2017 06:24   ASSESSMENT / PLAN: Severe COPD MRSA PNA with bacteremia Chest x-ray looks worse today  Acute on chronic respiratory failure.   PLAN/REC: Cont nocturnal BiPAP Cont supplemental O2 day Prednisone 40 mg daily and taper to off Cont nebulized bronchodilators Cont Vanc, Unasyn  ID has seen - repeating blood cultures once blood cultures negative pt will need PICC line placement for 2 week course of iv abx treatment  Echo pending   Tora KindredJohn Derrall Hicks, DO [place] [phone] Pager [phone] 02/04/2017 8:46 AM

## 2017-02-04 NOTE — Progress Notes (Signed)
Infectious Disease Long Term IV Antibiotic Orders Diagnosis: MRSA bacteremia, and pneumonia  Culture results Results for orders placed or performed during the hospital encounter of 01/28/17  Culture, blood (Routine X 2) w Reflex to ID Panel     Status: None (Preliminary result)   Collection Time: 02/01/17  9:51 PM  Result Value Ref Range Status   Specimen Description BLOOD BLOOD RIGHT ARM  Final   Special Requests   Final    BOTTLES DRAWN AEROBIC AND ANAEROBIC Blood Culture results may not be optimal due to an excessive volume of blood received in culture bottles   Culture NO GROWTH 3 DAYS  Final   Report Status PENDING  Incomplete  Culture, blood (Routine X 2) w Reflex to ID Panel     Status: Abnormal (Preliminary result)   Collection Time: 02/01/17  9:51 PM  Result Value Ref Range Status   Specimen Description BLOOD BLOOD LEFT HAND  Final   Special Requests   Final    BOTTLES DRAWN AEROBIC AND ANAEROBIC Blood Culture adequate volume   Culture  Setup Time   Final    IN BOTH AEROBIC AND ANAEROBIC BOTTLES GRAM POSITIVE COCCI CRITICAL RESULT CALLED TO, READ BACK BY AND VERIFIED WITH: DAVID BESANTI AT 2132 ON 02/02/17 BY SNJ    Culture (A)  Final    STAPHYLOCOCCUS AUREUS SUSCEPTIBILITIES TO FOLLOW Performed at [facility], [place] [address] [place], [place] [address]    Report Status PENDING  Incomplete  Blood Culture ID Panel (Reflexed)     Status: Abnormal   Collection Time: 02/01/17  9:51 PM  Result Value Ref Range Status   Enterococcus species NOT DETECTED NOT DETECTED Final   Listeria monocytogenes NOT DETECTED NOT DETECTED Final   Staphylococcus species DETECTED (A) NOT DETECTED Final    Comment: CRITICAL RESULT CALLED TO, READ BACK BY AND VERIFIED WITH: DAVID BESANTI AT 2132 ON 02/02/17 BY SNJ    Staphylococcus aureus DETECTED (A) NOT DETECTED Final    Comment: Methicillin (oxacillin)-resistant Staphylococcus aureus (MRSA). MRSA is predictably resistant to beta-lactam  antibiotics (except ceftaroline). Preferred therapy is vancomycin unless clinically contraindicated. Patient requires contact precautions if  hospitalized. CRITICAL RESULT CALLED TO, READ BACK BY AND VERIFIED WITH: DAVID BESANTI AT 2132 ON 02/02/17 BY SNJ    Methicillin resistance DETECTED (A) NOT DETECTED Final    Comment: CRITICAL RESULT CALLED TO, READ BACK BY AND VERIFIED WITH: DAVID BESANTI AT 2132 ON 02/02/17 BY SNJ    Streptococcus species NOT DETECTED NOT DETECTED Final   Streptococcus agalactiae NOT DETECTED NOT DETECTED Final   Streptococcus pneumoniae NOT DETECTED NOT DETECTED Final   Streptococcus pyogenes NOT DETECTED NOT DETECTED Final   Acinetobacter baumannii NOT DETECTED NOT DETECTED Final   Enterobacteriaceae species NOT DETECTED NOT DETECTED Final   Enterobacter cloacae complex NOT DETECTED NOT DETECTED Final   Escherichia coli NOT DETECTED NOT DETECTED Final   Klebsiella oxytoca NOT DETECTED NOT DETECTED Final   Klebsiella pneumoniae NOT DETECTED NOT DETECTED Final   Proteus species NOT DETECTED NOT DETECTED Final   Serratia marcescens NOT DETECTED NOT DETECTED Final   Haemophilus influenzae NOT DETECTED NOT DETECTED Final   Neisseria meningitidis NOT DETECTED NOT DETECTED Final   Pseudomonas aeruginosa NOT DETECTED NOT DETECTED Final   Candida albicans NOT DETECTED NOT DETECTED Final   Candida glabrata NOT DETECTED NOT DETECTED Final   Candida krusei NOT DETECTED NOT DETECTED Final   Candida parapsilosis NOT DETECTED NOT DETECTED Final   Candida  tropicalis NOT DETECTED NOT DETECTED Final  MRSA PCR Screening     Status: Abnormal   Collection Time: 02/01/17 11:28 PM  Result Value Ref Range Status   MRSA by PCR POSITIVE (A) NEGATIVE Final    Comment:        The GeneXpert MRSA Assay (FDA approved for NASAL specimens only), is one component of a comprehensive MRSA colonization surveillance program. It is not intended to diagnose MRSA infection nor to guide  or monitor treatment for MRSA infections. RESULT CALLED TO, READ BACK BY AND VERIFIED WITH: TESS THOMAS @ 0112 ON 02/02/2017 BY [facility]F   Culture, expectorated sputum-assessment     Status: None   Collection Time: 02/02/17  2:27 AM  Result Value Ref Range Status   Specimen Description SPUTUM  Final   Special Requests NONE  Final   Sputum evaluation THIS SPECIMEN IS ACCEPTABLE FOR SPUTUM CULTURE  Final   Report Status 02/02/2017 FINAL  Final  Culture, respiratory (NON-Expectorated)     Status: None   Collection Time: 02/02/17  2:27 AM  Result Value Ref Range Status   Specimen Description SPUTUM  Final   Special Requests NONE Reflexed from [id number]  Final   Gram Stain   Final    ABUNDANT WBC PRESENT,BOTH PMN AND MONONUCLEAR ABUNDANT GRAM POSITIVE COCCI IN PAIRS IN CLUSTERS MODERATE GRAM VARIABLE ROD    Culture   Final    ABUNDANT METHICILLIN RESISTANT STAPHYLOCOCCUS AUREUS   Report Status 02/04/2017 FINAL  Final   Organism ID, Bacteria METHICILLIN RESISTANT STAPHYLOCOCCUS AUREUS  Final      Susceptibility   Methicillin resistant staphylococcus aureus - MIC*    CIPROFLOXACIN >=8 RESISTANT Resistant     ERYTHROMYCIN >=8 RESISTANT Resistant     GENTAMICIN <=0.5 SENSITIVE Sensitive     OXACILLIN >=4 RESISTANT Resistant     TETRACYCLINE <=1 SENSITIVE Sensitive     VANCOMYCIN 1 SENSITIVE Sensitive     TRIMETH/SULFA <=10 SENSITIVE Sensitive     CLINDAMYCIN <=0.25 SENSITIVE Sensitive     RIFAMPIN <=0.5 SENSITIVE Sensitive     Inducible Clindamycin NEGATIVE Sensitive     * ABUNDANT METHICILLIN RESISTANT STAPHYLOCOCCUS AUREUS  C difficile quick scan w PCR reflex     Status: None   Collection Time: 02/02/17  9:10 PM  Result Value Ref Range Status   C Diff antigen NEGATIVE NEGATIVE Final   C Diff toxin NEGATIVE NEGATIVE Final   C Diff interpretation No C. difficile detected.  Final  Culture, blood (Routine X 2) w Reflex to ID Panel     Status: None (Preliminary result)   Collection Time:  02/03/17  1:28 PM  Result Value Ref Range Status   Specimen Description BLOOD RIGHT HAND  Final   Special Requests   Final    BOTTLES DRAWN AEROBIC AND ANAEROBIC Blood Culture results may not be optimal due to an inadequate volume of blood received in culture bottles   Culture NO GROWTH < 24 HOURS  Final   Report Status PENDING  Incomplete  Culture, blood (Routine X 2) w Reflex to ID Panel     Status: None (Preliminary result)   Collection Time: 02/03/17  1:36 PM  Result Value Ref Range Status   Specimen Description BLOOD RIGHT AC  Final   Special Requests   Final    BOTTLES DRAWN AEROBIC AND ANAEROBIC Blood Culture adequate volume   Culture NO GROWTH < 24 HOURS  Final   Report Status PENDING  Incomplete  Allergies: No Known Allergies  Discharge antibiotics Vancomycin        1000          mg  every        8       hours .     Goal vancomycin trough 15-20.    Pharmacy to adjust dosing based on levels  PICC Care per protocol Labs weekly while on IV antibiotics      CBC w diff   Comprehensive met panel Vancomycin Trough     Planned duration of antibiotics 4 weeks   Stop date July 19  Follow up clinic date TBD  FAX weekly labs to [phone]  Leonel Ramsay, MD

## 2017-02-05 ENCOUNTER — Inpatient Hospital Stay: Payer: Medicaid Other

## 2017-02-05 LAB — GLUCOSE, CAPILLARY
GLUCOSE-CAPILLARY: 113 mg/dL — AB (ref 65–99)
GLUCOSE-CAPILLARY: 112 mg/dL — AB (ref 65–99)
Glucose-Capillary: 106 mg/dL — ABNORMAL HIGH (ref 65–99)
Glucose-Capillary: 117 mg/dL — ABNORMAL HIGH (ref 65–99)

## 2017-02-05 LAB — BASIC METABOLIC PANEL
ANION GAP: 6 (ref 5–15)
BUN: 18 mg/dL (ref 6–20)
CHLORIDE: 102 mmol/L (ref 101–111)
CO2: 27 mmol/L (ref 22–32)
Calcium: 7.8 mg/dL — ABNORMAL LOW (ref 8.9–10.3)
Creatinine, Ser: 0.74 mg/dL (ref 0.61–1.24)
GFR calc Af Amer: >60 mL/min (ref >60)
GLUCOSE: 114 mg/dL — AB (ref 65–99)
POTASSIUM: 3.6 mmol/L (ref 3.5–5.1)
Sodium: 135 mmol/L (ref 135–145)

## 2017-02-05 LAB — CULTURE, BLOOD (ROUTINE X 2): SPECIAL REQUESTS: ADEQUATE

## 2017-02-05 LAB — CBC
HEMATOCRIT: 26.5 % — AB (ref 40.0–52.0)
HEMOGLOBIN: 9 g/dL — AB (ref 13.0–18.0)
MCH: 27.9 pg (ref 26.0–34.0)
MCHC: 33.9 g/dL (ref 32.0–36.0)
MCV: 82.1 fL (ref 80.0–100.0)
PLATELETS: 247 10*3/uL (ref 150–440)
RBC: 3.22 MIL/uL — AB (ref 4.40–5.90)
RDW: 16.6 % — ABNORMAL HIGH (ref 11.5–14.5)
WBC: 19.8 10*3/uL — AB (ref 3.8–10.6)

## 2017-02-05 LAB — MAGNESIUM: Magnesium: 2.2 mg/dL (ref 1.7–2.4)

## 2017-02-05 LAB — PHOSPHORUS: Phosphorus: 3.4 mg/dL (ref 2.5–4.6)

## 2017-02-05 LAB — VANCOMYCIN, TROUGH: Vancomycin Tr: 17 ug/mL (ref 15–20)

## 2017-02-05 MED ORDER — GUAIFENESIN-DM 100-10 MG/5ML PO SYRP
5.0000 mL | ORAL_SOLUTION | ORAL | Status: DC | PRN
  Administered 2017-02-05 – 2017-02-11 (×16): 5 mL via ORAL
  Filled 2017-02-05 (×18): qty 5

## 2017-02-05 NOTE — Progress Notes (Addendum)
Patient ID: Kristopher Powell Ethridge, male   DOB: 02/12/1956, 61 y.o.   MRN: [id number]  [facility] PROGRESS NOTE  Kristopher Powell Mijangos WUJ:[id number] DOB: 02/25/1956 DOA: 01/28/2017 PCP: Candie ChromanBarzin, Amir Homayoun, DO  HPI/Subjective: Patient asking for cough pill and cough syrup. Feels weak. Feel short of breath.  Objective: Vitals:   02/05/17 1000 02/05/17 1100  BP: 106/64 109/64  Pulse: (!) 106 (!) 108  Resp: (!) 24 (!) 29  Temp:      Filed Weights   02/03/17 0500 02/04/17 0500 02/05/17 0500  Weight: 79.8 kg (175 lb 14.8 oz) 79.5 kg (175 lb 4.3 oz) 79.3 kg (174 lb 13.2 oz)    ROS: Review of Systems  Constitutional: Negative for chills and fever.  Eyes: Negative for blurred vision.  Respiratory: Positive for cough, shortness of breath and wheezing.   Cardiovascular: Negative for chest pain.  Gastrointestinal: Negative for abdominal pain, constipation, diarrhea, nausea and vomiting.  Genitourinary: Negative for dysuria.  Musculoskeletal: Negative for joint pain.  Neurological: Negative for dizziness and headaches.   Exam: Physical Exam  Constitutional: He is oriented to person, place, and time.  HENT:  Nose: No mucosal edema.  Mouth/Throat: No oropharyngeal exudate or posterior oropharyngeal edema.  Eyes: Conjunctivae, EOM and lids are normal. Pupils are equal, round, and reactive to light.  Neck: No JVD present. Carotid bruit is not present. No edema present. No thyroid mass and no thyromegaly present.  Cardiovascular: S1 normal and S2 normal.  Exam reveals no gallop.   No murmur heard. Pulses:      Dorsalis pedis pulses are 2+ on the right side, and 2+ on the left side.  Respiratory: No respiratory distress. He has no wheezes. He has no rhonchi. He has no rales.  GI: Soft. Bowel sounds are normal. There is no tenderness.  Musculoskeletal:       Right ankle: He exhibits no swelling.       Left ankle: He exhibits no swelling.  Lymphadenopathy:    He has no cervical adenopathy.   Neurological: He is alert and oriented to person, place, and time. No cranial nerve deficit.  Skin: Skin is warm. No rash noted. Nails show no clubbing.  Psychiatric: He has a normal mood and affect.      Data Reviewed: Basic Metabolic Panel:  Recent Labs Lab 01/31/17 0448 02/01/17 0409 02/01/17 2151 02/02/17 0723 02/03/17 0409 02/03/17 1328 02/04/17 0453 02/05/17 0357  NA 135  --  133*  --   --   --  133* 135  K 3.9  --  4.0  --   --   --  3.7 3.6  CL 101  --  99*  --   --   --  100* 102  CO2 27  --  24  --   --   --  26 27  GLUCOSE 96  --  105*  --   --   --  84 114*  BUN 16  --  22*  --   --   --  18 18  CREATININE 0.61  --  1.43*  --  0.85  --  0.76 0.74  CALCIUM 8.3*  --  8.4*  --   --   --  7.6* 7.8*  MG 2.1 2.1  --  1.5*  --  1.8  --  2.2  PHOS 2.9 4.1  --  5.2*  --   --   --  3.4   Liver Function Tests:  Recent Labs  Lab 02/01/17 2151  AST 78*  ALT 84*  ALKPHOS 84  BILITOT 0.8  PROT 5.7*  ALBUMIN 3.2*   No results for input(s): LIPASE, AMYLASE in the last 168 hours. No results for input(s): AMMONIA in the last 168 hours. CBC:  Recent Labs Lab 02/01/17 2151 02/04/17 0453 02/05/17 0357  WBC 17.3* 22.2* 19.8*  NEUTROABS 12.6*  --   --   HGB 11.7* 9.3* 9.0*  HCT 34.4* 27.3* 26.5*  MCV 82.9 81.6 82.1  PLT 359 237 247   Cardiac Enzymes:  Recent Labs Lab 02/01/17 2151 02/02/17 1342  TROPONINI <0.03 <0.03   BNP (last 3 results)  Recent Labs  01/07/17 1554 01/14/17 1901 01/24/17 1044  BNP 15.0 23.0 11.0    CBG:  Recent Labs Lab 02/04/17 1146 02/04/17 1639 02/04/17 2155 02/05/17 0742 02/05/17 1215  GLUCAP 134* 158* 146* 113* 112*    Recent Results (from the past 240 hour(s))  Culture, blood (Routine X 2) w Reflex to ID Panel     Status: None (Preliminary result)   Collection Time: 02/01/17  9:51 PM  Result Value Ref Range Status   Specimen Description BLOOD BLOOD RIGHT ARM  Final   Special Requests   Final    BOTTLES DRAWN  AEROBIC AND ANAEROBIC Blood Culture results may not be optimal due to an excessive volume of blood received in culture bottles   Culture NO GROWTH 4 DAYS  Final   Report Status PENDING  Incomplete  Culture, blood (Routine X 2) w Reflex to ID Panel     Status: Abnormal   Collection Time: 02/01/17  9:51 PM  Result Value Ref Range Status   Specimen Description BLOOD BLOOD LEFT HAND  Final   Special Requests   Final    BOTTLES DRAWN AEROBIC AND ANAEROBIC Blood Culture adequate volume   Culture  Setup Time   Final    IN BOTH AEROBIC AND ANAEROBIC BOTTLES GRAM POSITIVE COCCI CRITICAL RESULT CALLED TO, READ BACK BY AND VERIFIED WITH: DAVID BESANTI AT 2132 ON 02/02/17 BY SNJ Performed at [facility], [address], [place], [place] [address]    Culture METHICILLIN RESISTANT STAPHYLOCOCCUS AUREUS (A)  Final   Report Status 02/05/2017 FINAL  Final   Organism ID, Bacteria METHICILLIN RESISTANT STAPHYLOCOCCUS AUREUS  Final      Susceptibility   Methicillin resistant staphylococcus aureus - MIC*    CIPROFLOXACIN >=8 RESISTANT Resistant     ERYTHROMYCIN >=8 RESISTANT Resistant     GENTAMICIN <=0.5 SENSITIVE Sensitive     OXACILLIN >=4 RESISTANT Resistant     TETRACYCLINE <=1 SENSITIVE Sensitive     VANCOMYCIN <=0.5 SENSITIVE Sensitive     TRIMETH/SULFA <=10 SENSITIVE Sensitive     CLINDAMYCIN <=0.25 SENSITIVE Sensitive     RIFAMPIN <=0.5 SENSITIVE Sensitive     Inducible Clindamycin NEGATIVE Sensitive     * METHICILLIN RESISTANT STAPHYLOCOCCUS AUREUS  Blood Culture ID Panel (Reflexed)     Status: Abnormal   Collection Time: 02/01/17  9:51 PM  Result Value Ref Range Status   Enterococcus species NOT DETECTED NOT DETECTED Final   Listeria monocytogenes NOT DETECTED NOT DETECTED Final   Staphylococcus species DETECTED (A) NOT DETECTED Final    Comment: CRITICAL RESULT CALLED TO, READ BACK BY AND VERIFIED WITH: DAVID BESANTI AT 2132 ON 02/02/17 BY SNJ    Staphylococcus aureus DETECTED (A)  NOT DETECTED Final    Comment: Methicillin (oxacillin)-resistant Staphylococcus aureus (MRSA). MRSA is predictably resistant to beta-lactam antibiotics (  except ceftaroline). Preferred therapy is vancomycin unless clinically contraindicated. Patient requires contact precautions if  hospitalized. CRITICAL RESULT CALLED TO, READ BACK BY AND VERIFIED WITH: DAVID BESANTI AT 2132 ON 02/02/17 BY SNJ    Methicillin resistance DETECTED (A) NOT DETECTED Final    Comment: CRITICAL RESULT CALLED TO, READ BACK BY AND VERIFIED WITH: DAVID BESANTI AT 2132 ON 02/02/17 BY SNJ    Streptococcus species NOT DETECTED NOT DETECTED Final   Streptococcus agalactiae NOT DETECTED NOT DETECTED Final   Streptococcus pneumoniae NOT DETECTED NOT DETECTED Final   Streptococcus pyogenes NOT DETECTED NOT DETECTED Final   Acinetobacter baumannii NOT DETECTED NOT DETECTED Final   Enterobacteriaceae species NOT DETECTED NOT DETECTED Final   Enterobacter cloacae complex NOT DETECTED NOT DETECTED Final   Escherichia coli NOT DETECTED NOT DETECTED Final   Klebsiella oxytoca NOT DETECTED NOT DETECTED Final   Klebsiella pneumoniae NOT DETECTED NOT DETECTED Final   Proteus species NOT DETECTED NOT DETECTED Final   Serratia marcescens NOT DETECTED NOT DETECTED Final   Haemophilus influenzae NOT DETECTED NOT DETECTED Final   Neisseria meningitidis NOT DETECTED NOT DETECTED Final   Pseudomonas aeruginosa NOT DETECTED NOT DETECTED Final   Candida albicans NOT DETECTED NOT DETECTED Final   Candida glabrata NOT DETECTED NOT DETECTED Final   Candida krusei NOT DETECTED NOT DETECTED Final   Candida parapsilosis NOT DETECTED NOT DETECTED Final   Candida tropicalis NOT DETECTED NOT DETECTED Final  MRSA PCR Screening     Status: Abnormal   Collection Time: 02/01/17 11:28 PM  Result Value Ref Range Status   MRSA by PCR POSITIVE (A) NEGATIVE Final    Comment:        The GeneXpert MRSA Assay (FDA approved for NASAL specimens only), is  one component of a comprehensive MRSA colonization surveillance program. It is not intended to diagnose MRSA infection nor to guide or monitor treatment for MRSA infections. RESULT CALLED TO, READ BACK BY AND VERIFIED WITH: TESS THOMAS @ 0112 ON 02/02/2017 BY [facility]   Culture, expectorated sputum-assessment     Status: None   Collection Time: 02/02/17  2:27 AM  Result Value Ref Range Status   Specimen Description SPUTUM  Final   Special Requests NONE  Final   Sputum evaluation THIS SPECIMEN IS ACCEPTABLE FOR SPUTUM CULTURE  Final   Report Status 02/02/2017 FINAL  Final  Culture, respiratory (NON-Expectorated)     Status: None   Collection Time: 02/02/17  2:27 AM  Result Value Ref Range Status   Specimen Description SPUTUM  Final   Special Requests NONE Reflexed from [id number]  Final   Gram Stain   Final    ABUNDANT WBC PRESENT,BOTH PMN AND MONONUCLEAR ABUNDANT GRAM POSITIVE COCCI IN PAIRS IN CLUSTERS MODERATE GRAM VARIABLE ROD    Culture   Final    ABUNDANT METHICILLIN RESISTANT STAPHYLOCOCCUS AUREUS   Report Status 02/04/2017 FINAL  Final   Organism ID, Bacteria METHICILLIN RESISTANT STAPHYLOCOCCUS AUREUS  Final      Susceptibility   Methicillin resistant staphylococcus aureus - MIC*    CIPROFLOXACIN >=8 RESISTANT Resistant     ERYTHROMYCIN >=8 RESISTANT Resistant     GENTAMICIN <=0.5 SENSITIVE Sensitive     OXACILLIN >=4 RESISTANT Resistant     TETRACYCLINE <=1 SENSITIVE Sensitive     VANCOMYCIN 1 SENSITIVE Sensitive     TRIMETH/SULFA <=10 SENSITIVE Sensitive     CLINDAMYCIN <=0.25 SENSITIVE Sensitive     RIFAMPIN <=0.5 SENSITIVE Sensitive     Inducible Clindamycin  NEGATIVE Sensitive     * ABUNDANT METHICILLIN RESISTANT STAPHYLOCOCCUS AUREUS  C difficile quick scan w PCR reflex     Status: None   Collection Time: 02/02/17  9:10 PM  Result Value Ref Range Status   C Diff antigen NEGATIVE NEGATIVE Final   C Diff toxin NEGATIVE NEGATIVE Final   C Diff interpretation No C.  difficile detected.  Final  Culture, blood (Routine X 2) w Reflex to ID Panel     Status: None (Preliminary result)   Collection Time: 02/03/17  1:28 PM  Result Value Ref Range Status   Specimen Description BLOOD RIGHT HAND  Final   Special Requests   Final    BOTTLES DRAWN AEROBIC AND ANAEROBIC Blood Culture results may not be optimal due to an inadequate volume of blood received in culture bottles   Culture NO GROWTH 2 DAYS  Final   Report Status PENDING  Incomplete  Culture, blood (Routine X 2) w Reflex to ID Panel     Status: None (Preliminary result)   Collection Time: 02/03/17  1:36 PM  Result Value Ref Range Status   Specimen Description BLOOD RIGHT AC  Final   Special Requests   Final    BOTTLES DRAWN AEROBIC AND ANAEROBIC Blood Culture adequate volume   Culture NO GROWTH 2 DAYS  Final   Report Status PENDING  Incomplete     Studies: Dg Chest Port 1 View  Result Date: 02/05/2017 CLINICAL DATA:  Acute respiratory failure. EXAM: PORTABLE CHEST 1 VIEW COMPARISON:  02/04/2017 FINDINGS: The patient is rotated to the left. The cardiomediastinal silhouette is grossly unchanged allowing for rotation. There is persistent dense airspace consolidation with air bronchograms in the left upper lobe, at most mildly improved. Coarse opacities diffusely throughout the right lung have not significantly changed. No sizable pleural effusion or pneumothorax is identified. IMPRESSION: Extensive bilateral lung opacities including dense left upper lobe consolidation compatible with pneumonia, at most mildly improved. Electronically Signed   By: Sebastian Ache M.D.   On: 02/05/2017 08:29   Dg Chest Port 1 View  Result Date: 02/04/2017 CLINICAL DATA:  Respiratory failure. EXAM: PORTABLE CHEST 1 VIEW COMPARISON:  02/01/2017.  CT 08/17/2016. FINDINGS: Heart size stable. Diffuse dense bilateral pulmonary infiltrates/edema. Interim significant progression from prior exam. No pleural effusion or pneumothorax.  IMPRESSION: Diffuse dense bilateral pulmonary infiltrates/edema . Interim significant progression from prior exam . Electronically Signed   By: Maisie Fus  Register   On: 02/04/2017 06:24    Scheduled Meds: . aspirin EC  325 mg Oral Daily  . budesonide (PULMICORT) nebulizer solution  0.25 mg Nebulization Q6H  . Chlorhexidine Gluconate Cloth  6 each Topical Q0600  . enoxaparin (LOVENOX) injection  40 mg Subcutaneous Q24H  . folic acid  1 mg Oral Daily  . gabapentin  1,200 mg Oral TID  . imipramine  200 mg Oral QHS  . insulin aspart  0-15 Units Subcutaneous TID WC  . insulin aspart  0-5 Units Subcutaneous QHS  . ipratropium-albuterol  3 mL Nebulization Q6H  . metoprolol succinate  25 mg Oral Daily  . multivitamin with minerals  1 tablet Oral Daily  . mupirocin ointment  1 application Nasal BID  . pantoprazole  40 mg Oral Daily  . predniSONE  40 mg Oral Q breakfast  . risperiDONE  4 mg Oral QHS  . sodium chloride flush  3 mL Intravenous Q12H  . vitamin B-12  1,000 mcg Oral Daily   Continuous Infusions: . vancomycin Stopped (  02/05/17 0932)    Assessment/Plan:  1. MRSA sepsis with left-sided pneumonia. On IV vancomycin. Patient will need 4 weeks of IV antibiotics. Potentially will place PICC line tomorrow. Repeat blood cultures so far negative. 2. COPD exacerbation with acute on chronic respiratory failure. Continue prednisone, DuoNeb and budesonide nebulizers. Patient on 6 Powell nasal cannula. Off of BiPAP at this point 3. Chronic mild hyponatremia secondary to over drinking water 4. Steroid-induced hyperglycemia on sliding scale 5. Weakness physical therapy evaluation 6. Discontinue Foley 7. Anemia of chronic disease  Code Status:     Code Status Orders        Start     Ordered   01/28/17 1407  Limited resuscitation (code)  Continuous    Question Answer Comment  In the event of cardiac or respiratory ARREST: Initiate Code Blue, Call Rapid Response Yes   In the event of cardiac or  respiratory ARREST: Perform CPR Yes   In the event of cardiac or respiratory ARREST: Perform Intubation/Mechanical Ventilation No   In the event of cardiac or respiratory ARREST: Use NIPPV/BiPAp only if indicated Yes   In the event of cardiac or respiratory ARREST: Administer ACLS medications if indicated Yes   In the event of cardiac or respiratory ARREST: Perform Defibrillation or Cardioversion if indicated Yes      01/28/17 1408    Code Status History    Date Active Date Inactive Code Status Order ID Comments User Context   01/24/2017  1:56 PM 01/25/2017  4:17 PM Full Code [id number]  Enedina Finner, MD Inpatient   01/15/2017 12:37 AM 01/16/2017  4:17 PM Full Code [id number]  Oralia Manis, MD Inpatient   01/07/2017  7:48 PM 01/09/2017  6:41 PM Full Code [id number]  Altamese Dilling, MD ED   09/05/2016  6:08 PM 09/07/2016  7:37 PM Full Code [id number]  Marguarite Arbour, MD Inpatient   08/14/2016 10:48 AM 08/19/2016  7:57 PM Full Code [id number]  Arnaldo Natal, MD Inpatient   08/06/2016 11:40 PM 08/07/2016  4:34 PM Full Code [id number]  Tonye Royalty, DO Inpatient   05/23/2015 10:14 PM 05/25/2015  2:59 PM Full Code [id number]  Altamese Dilling, MD Inpatient     Disposition Plan: Will need rehabilitation once hospitalization is known  Consultants:  Critical care specialist  Infectious disease doctor  Antibiotics:  IV vancomycin  Time spent: 28 minutes  Alford Highland  [employer]

## 2017-02-05 NOTE — Progress Notes (Signed)
   Name: Kristopher Powell MRN: [id number] DOB: 08/12/1956    ADMISSION DATE:  01/28/2017  CONSULTATION DATE: 6/19  REFERRING MD :  Dr. Anne HahnWillis  CHIEF COMPLAINT: Altered mental status, Hypoxia  BRIEF PATIENT DESCRIPTION: 61 year old male initially admitted with COPD.  Now transferred from the floor due to altered mental status and hypoxia  due to Pneumonia  SIGNIFICANT EVENTS  6/15 Patient admitted with COPD Exacerbation 6/19 Patient transferred from the floor due to altered , mental status and hypoxia due to PNA  STUDIES:  None  SUBJECTIVE:  States he is doing well today, states his breathing has improved since yesterday. Patient is eating now, wore BiPAP last night  VITAL SIGNS: Temp:  [98 F (36.7 C)-98.4 F (36.9 C)] 98.2 F (36.8 C) (06/23 0400) Pulse Rate:  [80-95] 92 (06/23 0600) Resp:  [17-31] 20 (06/23 0600) BP: (93-128)/(56-91) 117/78 (06/23 0600) SpO2:  [90 %-99 %] 93 % (06/23 0600) Weight:  [79.3 kg (174 lb 13.2 oz)] 79.3 kg (174 lb 13.2 oz) (06/23 0500)  PHYSICAL EXAMINATION: General:  61 year old male, on 6L, in no acute distress Neuro: follows commands, alert and oriented HEENT:  AT,,No JVD, Cardiovascular:  Tachycardic,s1s2,nom/r/g Lungs: expiratory wheezes and rhonchi right upper and middle lobe, diminished in all other lobes, even non labored  Abdomen: soft,NT,ND Musculoskeletal:  No edema,cyanosis  Skin:  Warm,dry and intact   Recent Labs Lab 02/01/17 2151 02/03/17 0409 02/04/17 0453 02/05/17 0357  NA 133*  --  133* 135  K 4.0  --  3.7 3.6  CL 99*  --  100* 102  CO2 24  --  26 27  BUN 22*  --  18 18  CREATININE 1.43* 0.85 0.76 0.74  GLUCOSE 105*  --  84 114*    Recent Labs Lab 02/01/17 2151 02/04/17 0453 02/05/17 0357  HGB 11.7* 9.3* 9.0*  HCT 34.4* 27.3* 26.5*  WBC 17.3* 22.2* 19.8*  PLT 359 237 247   Dg Chest Port 1 View  Result Date: 02/05/2017 CLINICAL DATA:  Acute respiratory failure. EXAM: PORTABLE CHEST 1 VIEW COMPARISON:   02/04/2017 FINDINGS: The patient is rotated to the left. The cardiomediastinal silhouette is grossly unchanged allowing for rotation. There is persistent dense airspace consolidation with air bronchograms in the left upper lobe, at most mildly improved. Coarse opacities diffusely throughout the right lung have not significantly changed. No sizable pleural effusion or pneumothorax is identified. IMPRESSION: Extensive bilateral lung opacities including dense left upper lobe consolidation compatible with pneumonia, at most mildly improved. Electronically Signed   By: Sebastian AcheAllen  Grady M.D.   On: 02/05/2017 08:29   Dg Chest Port 1 View  Result Date: 02/04/2017 CLINICAL DATA:  Respiratory failure. EXAM: PORTABLE CHEST 1 VIEW COMPARISON:  02/01/2017.  CT 08/17/2016. FINDINGS: Heart size stable. Diffuse dense bilateral pulmonary infiltrates/edema. Interim significant progression from prior exam. No pleural effusion or pneumothorax. IMPRESSION: Diffuse dense bilateral pulmonary infiltrates/edema . Interim significant progression from prior exam . Electronically Signed   By: Maisie Fushomas  Register   On: 02/04/2017 06:24   ASSESSMENT / PLAN: Severe COPD MRSA PNA with bacteremia Chest x-ray looks worse today  Acute on chronic respiratory failure.   PLAN/REC: Cont nocturnal BiPAP Cont supplemental O2 day Prednisone 40 mg daily and taper to off Cont nebulized bronchodilators Cont Vanc ID folowing - appreciate input Echo pending   Tora KindredJohn Wil Slape, DO Mobile [phone] Pager [phone] 02/05/2017 11:16 AM

## 2017-02-05 NOTE — Progress Notes (Signed)
Pharmacy Antibiotic Note  Kristopher Powell is a 61 y.o. male admitted on 01/28/2017 with pneumonia. Pharmacy has been consulted for vancomycin dosing for MRSA bacteremia and PNA with planned duration of abx x 4 weeks. Stop date 7/19 per ID.  Plan: Continue vancomycin 1000 mg iv q 8 hours with renal function stable and trough at goal of 15-20 mcg/ml. Will check levels at least weekly or as clinically indicated.   Height: 6' (182.9 cm) Weight: 174 lb 13.2 oz (79.3 kg) IBW/kg (Calculated) : 77.6  Temp (24hrs), Avg:98.2 F (36.8 C), Min:98 F (36.7 C), Max:98.4 F (36.9 C)   Recent Labs Lab 01/31/17 0448 02/01/17 2151  02/03/17 0317 02/03/17 0409  02/04/17 0453 02/04/17 1659 02/05/17 0357 02/05/17 0744  WBC  --  17.3*  --   --   --   --  22.2*  --  19.8*  --   CREATININE 0.61 1.43*  --   --  0.85  --  0.76  --  0.74  --   LATICACIDVEN  --  4.7*  --   --   --   --   --   --   --   --   VANCOTROUGH  --   --   < >  --   --   < >  --  37*  --  17  VANCORANDOM  --   --   --  19  --   --   --   --   --   --   < > = values in this interval not displayed.  Estimated Creatinine Clearance: 106.4 mL/min (by C-G formula based on SCr of 0.74 mg/dL).    No Known Allergies   Luisa HartScott Keevan Wolz, PharmD Clinical Pharmacist  02/05/2017 9:30 AM

## 2017-02-06 LAB — CBC
HCT: 27.7 % — ABNORMAL LOW (ref 40.0–52.0)
Hemoglobin: 9.4 g/dL — ABNORMAL LOW (ref 13.0–18.0)
MCH: 27.9 pg (ref 26.0–34.0)
MCHC: 34 g/dL (ref 32.0–36.0)
MCV: 82 fL (ref 80.0–100.0)
PLATELETS: 259 10*3/uL (ref 150–440)
RBC: 3.39 MIL/uL — AB (ref 4.40–5.90)
RDW: 16.9 % — ABNORMAL HIGH (ref 11.5–14.5)
WBC: 16.6 10*3/uL — AB (ref 3.8–10.6)

## 2017-02-06 LAB — ECHOCARDIOGRAM COMPLETE
HEIGHTINCHES: 72 in
Weight: 2814.83 oz

## 2017-02-06 LAB — GLUCOSE, CAPILLARY
GLUCOSE-CAPILLARY: 96 mg/dL (ref 65–99)
GLUCOSE-CAPILLARY: 184 mg/dL — AB (ref 65–99)
Glucose-Capillary: 157 mg/dL — ABNORMAL HIGH (ref 65–99)
Glucose-Capillary: 174 mg/dL — ABNORMAL HIGH (ref 65–99)

## 2017-02-06 LAB — BASIC METABOLIC PANEL
ANION GAP: 6 (ref 5–15)
BUN: 13 mg/dL (ref 6–20)
CO2: 29 mmol/L (ref 22–32)
Calcium: 7.5 mg/dL — ABNORMAL LOW (ref 8.9–10.3)
Chloride: 101 mmol/L (ref 101–111)
Creatinine, Ser: 0.78 mg/dL (ref 0.61–1.24)
Glucose, Bld: 107 mg/dL — ABNORMAL HIGH (ref 65–99)
POTASSIUM: 3.4 mmol/L — AB (ref 3.5–5.1)
SODIUM: 136 mmol/L (ref 135–145)

## 2017-02-06 LAB — CULTURE, BLOOD (ROUTINE X 2): CULTURE: NO GROWTH

## 2017-02-06 MED ORDER — BUDESONIDE 0.5 MG/2ML IN SUSP
0.5000 mg | Freq: Two times a day (BID) | RESPIRATORY_TRACT | Status: DC
  Administered 2017-02-06 – 2017-02-11 (×8): 0.5 mg via RESPIRATORY_TRACT
  Filled 2017-02-06 (×10): qty 2

## 2017-02-06 MED ORDER — METHYLPREDNISOLONE SODIUM SUCC 40 MG IJ SOLR: 40.0000 mg | Freq: Every day | INTRAMUSCULAR | Status: DC

## 2017-02-06 MED ORDER — POTASSIUM CHLORIDE CRYS ER 20 MEQ PO TBCR
40.0000 meq | EXTENDED_RELEASE_TABLET | Freq: Once | ORAL | Status: AC
  Administered 2017-02-06: 40 meq via ORAL
  Filled 2017-02-06: qty 2

## 2017-02-06 MED ORDER — METHYLPREDNISOLONE SODIUM SUCC 125 MG IJ SOLR
60.0000 mg | Freq: Four times a day (QID) | INTRAMUSCULAR | Status: DC
  Administered 2017-02-06 – 2017-02-07 (×4): 60 mg via INTRAVENOUS
  Filled 2017-02-06 (×4): qty 2

## 2017-02-06 NOTE — Plan of Care (Signed)
Problem: Activity: Goal: Risk for activity intolerance will decrease Outcome: Not Progressing Patient was encouraged OOB to recliner this morning. Patient very DOE. After sitting in chair for short amount of time patient insisted he could not breathe and wanted to get back in bed. Patient was encouraged to try to sit upright in chair but patient refused. Patient placed back in bed by nursing assistant.

## 2017-02-06 NOTE — Progress Notes (Signed)
Patient ID: Kristopher Powell, male   DOB: [birth year], 61 y.o.   MRN: [id number]  [employer] [facility] PROGRESS NOTE  Kristopher Powell VHQ:[id number] DOB: 1956-05-27 DOA: 01/28/2017 PCP: Candie Chroman, DO  HPI/Subjective: Patient working to breathe this morning. He was on BiPAP last night and flipped over to 6 L nasal cannula this morning. Using sensory muscle bottles and audible wheezing. Patient to short of breath and wheezing and coughing.  Objective: Vitals:   02/06/17 0358 02/06/17 0808  BP: 109/64 122/75  Pulse: (!) 103 (!) 107  Resp: 18 (!) 29  Temp: 97.7 F (36.5 C) 98.1 F (36.7 C)    Filed Weights   02/04/17 0500 02/05/17 0500 02/06/17 0358  Weight: 79.5 kg (175 lb 4.3 oz) 79.3 kg (174 lb 13.2 oz) 82.6 kg (182 lb 1.6 oz)    ROS: Review of Systems  Constitutional: Negative for chills and fever.  Eyes: Negative for blurred vision.  Respiratory: Positive for cough, shortness of breath and wheezing.   Cardiovascular: Negative for chest pain.  Gastrointestinal: Negative for abdominal pain, constipation, diarrhea, nausea and vomiting.  Genitourinary: Negative for dysuria.  Musculoskeletal: Negative for joint pain.  Neurological: Negative for dizziness and headaches.   Exam: Physical Exam  Constitutional: He is oriented to person, place, and time.  HENT:  Nose: No mucosal edema.  Mouth/Throat: No oropharyngeal exudate or posterior oropharyngeal edema.  Eyes: Conjunctivae, EOM and lids are normal. Pupils are equal, round, and reactive to light.  Neck: No JVD present. Carotid bruit is not present. No edema present. No thyroid mass and no thyromegaly present.  Cardiovascular: S1 normal, S2 normal and normal heart sounds.  Tachycardia present.  Exam reveals no gallop.   No murmur heard. Pulses:      Dorsalis pedis pulses are 2+ on the right side, and 2+ on the left side.  Respiratory: Accessory muscle usage present. No respiratory distress. He has decreased breath sounds  in the right middle field, the right lower field, the left middle field and the left lower field. He has wheezes in the right middle field, the right lower field, the left middle field and the left lower field. He has no rhonchi. He has no rales.  GI: Soft. Bowel sounds are normal. There is no tenderness.  Musculoskeletal:       Right ankle: He exhibits no swelling.       Left ankle: He exhibits no swelling.  Lymphadenopathy:    He has no cervical adenopathy.  Neurological: He is alert and oriented to person, place, and time. No cranial nerve deficit.  Skin: Skin is warm. No rash noted. Nails show no clubbing.  Psychiatric: He has a normal mood and affect.      Data Reviewed: Basic Metabolic Panel:  Recent Labs Lab 01/31/17 0448 02/01/17 0409 02/01/17 2151 02/02/17 0723 02/03/17 0409 02/03/17 1328 02/04/17 0453 02/05/17 0357 02/06/17 0441  NA 135  --  133*  --   --   --  133* 135 136  K 3.9  --  4.0  --   --   --  3.7 3.6 3.4*  CL 101  --  99*  --   --   --  100* 102 101  CO2 27  --  24  --   --   --  26 27 29   GLUCOSE 96  --  105*  --   --   --  84 114* 107*  BUN 16  --  22*  --   --   --  18 18 13   CREATININE 0.61  --  1.43*  --  0.85  --  0.76 0.74 0.78  CALCIUM 8.3*  --  8.4*  --   --   --  7.6* 7.8* 7.5*  MG 2.1 2.1  --  1.5*  --  1.8  --  2.2  --   PHOS 2.9 4.1  --  5.2*  --   --   --  3.4  --    Liver Function Tests:  Recent Labs Lab 02/01/17 2151  AST 78*  ALT 84*  ALKPHOS 84  BILITOT 0.8  PROT 5.7*  ALBUMIN 3.2*   CBC:  Recent Labs Lab 02/01/17 2151 02/04/17 0453 02/05/17 0357 02/06/17 0441  WBC 17.3* 22.2* 19.8* 16.6*  NEUTROABS 12.6*  --   --   --   HGB 11.7* 9.3* 9.0* 9.4*  HCT 34.4* 27.3* 26.5* 27.7*  MCV 82.9 81.6 82.1 82.0  PLT 359 237 247 259   Cardiac Enzymes:  Recent Labs Lab 02/01/17 2151 02/02/17 1342  TROPONINI <0.03 <0.03   BNP (last 3 results)  Recent Labs  01/07/17 1554 01/14/17 1901 01/24/17 1044  BNP 15.0 23.0  11.0    CBG:  Recent Labs Lab 02/05/17 0742 02/05/17 1215 02/05/17 1631 02/05/17 2048 02/06/17 0747  GLUCAP 113* 112* 106* 117* 96    Recent Results (from the past 240 hour(s))  Culture, blood (Routine X 2) w Reflex to ID Panel     Status: None   Collection Time: 02/01/17  9:51 PM  Result Value Ref Range Status   Specimen Description BLOOD BLOOD RIGHT ARM  Final   Special Requests   Final    BOTTLES DRAWN AEROBIC AND ANAEROBIC Blood Culture results may not be optimal due to an excessive volume of blood received in culture bottles   Culture NO GROWTH 5 DAYS  Final   Report Status 02/06/2017 FINAL  Final  Culture, blood (Routine X 2) w Reflex to ID Panel     Status: Abnormal   Collection Time: 02/01/17  9:51 PM  Result Value Ref Range Status   Specimen Description BLOOD BLOOD LEFT HAND  Final   Special Requests   Final    BOTTLES DRAWN AEROBIC AND ANAEROBIC Blood Culture adequate volume   Culture  Setup Time   Final    IN BOTH AEROBIC AND ANAEROBIC BOTTLES GRAM POSITIVE COCCI CRITICAL RESULT CALLED TO, READ BACK BY AND VERIFIED WITH: DAVID BESANTI AT 2132 ON 02/02/17 BY SNJ Performed at [facility], [address] [place], [place] [address]    Culture METHICILLIN RESISTANT STAPHYLOCOCCUS AUREUS (A)  Final   Report Status 02/05/2017 FINAL  Final   Organism ID, Bacteria METHICILLIN RESISTANT STAPHYLOCOCCUS AUREUS  Final      Susceptibility   Methicillin resistant staphylococcus aureus - MIC*    CIPROFLOXACIN >=8 RESISTANT Resistant     ERYTHROMYCIN >=8 RESISTANT Resistant     GENTAMICIN <=0.5 SENSITIVE Sensitive     OXACILLIN >=4 RESISTANT Resistant     TETRACYCLINE <=1 SENSITIVE Sensitive     VANCOMYCIN <=0.5 SENSITIVE Sensitive     TRIMETH/SULFA <=10 SENSITIVE Sensitive     CLINDAMYCIN <=0.25 SENSITIVE Sensitive     RIFAMPIN <=0.5 SENSITIVE Sensitive     Inducible Clindamycin NEGATIVE Sensitive     * METHICILLIN RESISTANT STAPHYLOCOCCUS AUREUS  Blood Culture  ID Panel (Reflexed)     Status: Abnormal   Collection Time: 02/01/17  9:51 PM  Result Value Ref Range Status  Enterococcus species NOT DETECTED NOT DETECTED Final   Listeria monocytogenes NOT DETECTED NOT DETECTED Final   Staphylococcus species DETECTED (A) NOT DETECTED Final    Comment: CRITICAL RESULT CALLED TO, READ BACK BY AND VERIFIED WITH: DAVID BESANTI AT 2132 ON 02/02/17 BY SNJ    Staphylococcus aureus DETECTED (A) NOT DETECTED Final    Comment: Methicillin (oxacillin)-resistant Staphylococcus aureus (MRSA). MRSA is predictably resistant to beta-lactam antibiotics (except ceftaroline). Preferred therapy is vancomycin unless clinically contraindicated. Patient requires contact precautions if  hospitalized. CRITICAL RESULT CALLED TO, READ BACK BY AND VERIFIED WITH: DAVID BESANTI AT 2132 ON 02/02/17 BY SNJ    Methicillin resistance DETECTED (A) NOT DETECTED Final    Comment: CRITICAL RESULT CALLED TO, READ BACK BY AND VERIFIED WITH: DAVID BESANTI AT 2132 ON 02/02/17 BY SNJ    Streptococcus species NOT DETECTED NOT DETECTED Final   Streptococcus agalactiae NOT DETECTED NOT DETECTED Final   Streptococcus pneumoniae NOT DETECTED NOT DETECTED Final   Streptococcus pyogenes NOT DETECTED NOT DETECTED Final   Acinetobacter baumannii NOT DETECTED NOT DETECTED Final   Enterobacteriaceae species NOT DETECTED NOT DETECTED Final   Enterobacter cloacae complex NOT DETECTED NOT DETECTED Final   Escherichia coli NOT DETECTED NOT DETECTED Final   Klebsiella oxytoca NOT DETECTED NOT DETECTED Final   Klebsiella pneumoniae NOT DETECTED NOT DETECTED Final   Proteus species NOT DETECTED NOT DETECTED Final   Serratia marcescens NOT DETECTED NOT DETECTED Final   Haemophilus influenzae NOT DETECTED NOT DETECTED Final   Neisseria meningitidis NOT DETECTED NOT DETECTED Final   Pseudomonas aeruginosa NOT DETECTED NOT DETECTED Final   Candida albicans NOT DETECTED NOT DETECTED Final   Candida glabrata NOT  DETECTED NOT DETECTED Final   Candida krusei NOT DETECTED NOT DETECTED Final   Candida parapsilosis NOT DETECTED NOT DETECTED Final   Candida tropicalis NOT DETECTED NOT DETECTED Final  MRSA PCR Screening     Status: Abnormal   Collection Time: 02/01/17 11:28 PM  Result Value Ref Range Status   MRSA by PCR POSITIVE (A) NEGATIVE Final    Comment:        The GeneXpert MRSA Assay (FDA approved for NASAL specimens only), is one component of a comprehensive MRSA colonization surveillance program. It is not intended to diagnose MRSA infection nor to guide or monitor treatment for MRSA infections. RESULT CALLED TO, READ BACK BY AND VERIFIED WITH: TESS THOMAS @ 0112 ON 02/02/2017 BY [facility]   Culture, expectorated sputum-assessment     Status: None   Collection Time: 02/02/17  2:27 AM  Result Value Ref Range Status   Specimen Description SPUTUM  Final   Special Requests NONE  Final   Sputum evaluation THIS SPECIMEN IS ACCEPTABLE FOR SPUTUM CULTURE  Final   Report Status 02/02/2017 FINAL  Final  Culture, respiratory (NON-Expectorated)     Status: None   Collection Time: 02/02/17  2:27 AM  Result Value Ref Range Status   Specimen Description SPUTUM  Final   Special Requests NONE Reflexed from [id number]  Final   Gram Stain   Final    ABUNDANT WBC PRESENT,BOTH PMN AND MONONUCLEAR ABUNDANT GRAM POSITIVE COCCI IN PAIRS IN CLUSTERS MODERATE GRAM VARIABLE ROD    Culture   Final    ABUNDANT METHICILLIN RESISTANT STAPHYLOCOCCUS AUREUS   Report Status 02/04/2017 FINAL  Final   Organism ID, Bacteria METHICILLIN RESISTANT STAPHYLOCOCCUS AUREUS  Final      Susceptibility   Methicillin resistant staphylococcus aureus - MIC*    CIPROFLOXACIN >=  8 RESISTANT Resistant     ERYTHROMYCIN >=8 RESISTANT Resistant     GENTAMICIN <=0.5 SENSITIVE Sensitive     OXACILLIN >=4 RESISTANT Resistant     TETRACYCLINE <=1 SENSITIVE Sensitive     VANCOMYCIN 1 SENSITIVE Sensitive     TRIMETH/SULFA <=10 SENSITIVE  Sensitive     CLINDAMYCIN <=0.25 SENSITIVE Sensitive     RIFAMPIN <=0.5 SENSITIVE Sensitive     Inducible Clindamycin NEGATIVE Sensitive     * ABUNDANT METHICILLIN RESISTANT STAPHYLOCOCCUS AUREUS  C difficile quick scan w PCR reflex     Status: None   Collection Time: 02/02/17  9:10 PM  Result Value Ref Range Status   C Diff antigen NEGATIVE NEGATIVE Final   C Diff toxin NEGATIVE NEGATIVE Final   C Diff interpretation No C. difficile detected.  Final  Culture, blood (Routine X 2) w Reflex to ID Panel     Status: None (Preliminary result)   Collection Time: 02/03/17  1:28 PM  Result Value Ref Range Status   Specimen Description BLOOD RIGHT HAND  Final   Special Requests   Final    BOTTLES DRAWN AEROBIC AND ANAEROBIC Blood Culture results may not be optimal due to an inadequate volume of blood received in culture bottles   Culture NO GROWTH 3 DAYS  Final   Report Status PENDING  Incomplete  Culture, blood (Routine X 2) w Reflex to ID Panel     Status: None (Preliminary result)   Collection Time: 02/03/17  1:36 PM  Result Value Ref Range Status   Specimen Description BLOOD RIGHT AC  Final   Special Requests   Final    BOTTLES DRAWN AEROBIC AND ANAEROBIC Blood Culture adequate volume   Culture NO GROWTH 3 DAYS  Final   Report Status PENDING  Incomplete     Studies: Dg Chest Port 1 View  Result Date: 02/05/2017 CLINICAL DATA:  Acute respiratory failure. EXAM: PORTABLE CHEST 1 VIEW COMPARISON:  02/04/2017 FINDINGS: The patient is rotated to the left. The cardiomediastinal silhouette is grossly unchanged allowing for rotation. There is persistent dense airspace consolidation with air bronchograms in the left upper lobe, at most mildly improved. Coarse opacities diffusely throughout the right lung have not significantly changed. No sizable pleural effusion or pneumothorax is identified. IMPRESSION: Extensive bilateral lung opacities including dense left upper lobe consolidation compatible  with pneumonia, at most mildly improved. Electronically Signed   By: Sebastian Ache M.D.   On: 02/05/2017 08:29    Scheduled Meds: . aspirin EC  325 mg Oral Daily  . budesonide (PULMICORT) nebulizer solution  0.5 mg Nebulization BID  . Chlorhexidine Gluconate Cloth  6 each Topical Q0600  . enoxaparin (LOVENOX) injection  40 mg Subcutaneous Q24H  . folic acid  1 mg Oral Daily  . gabapentin  1,200 mg Oral TID  . imipramine  200 mg Oral QHS  . insulin aspart  0-15 Units Subcutaneous TID WC  . insulin aspart  0-5 Units Subcutaneous QHS  . ipratropium-albuterol  3 mL Nebulization Q6H  . methylPREDNISolone (SOLU-MEDROL) injection  40 mg Intravenous Daily  . metoprolol succinate  25 mg Oral Daily  . multivitamin with minerals  1 tablet Oral Daily  . mupirocin ointment  1 application Nasal BID  . pantoprazole  40 mg Oral Daily  . potassium chloride  40 mEq Oral Once  . risperiDONE  4 mg Oral QHS  . sodium chloride flush  3 mL Intravenous Q12H  . vitamin B-12  1,000 mcg  Oral Daily   Continuous Infusions: . vancomycin 1,000 mg (02/06/17 0854)    Assessment/Plan:  1. Acute on chronic respiratory failure. Case discussed with respiratory to try high flow nasal cannula. BiPAP at night. Case discussed with critical care specialist to reevaluate today. 2. MRSA sepsis with left-sided pneumonia. On IV vancomycin. Patient will need 4 weeks of IV antibiotics. Place PICC line once respiratory status improved Repeat blood cultures so far negative. 3. COPD exacerbation. Switch prednisone back to Solu-Medrol. DuoNeb and budesonide nebulizers.  4. Chronic mild hyponatremia secondary to over drinking water. Sodium normal range 5. Steroid-induced hyperglycemia. Patient on sliding scale 6. Weakness physical therapy evaluation 7. Anemia of chronic disease  Code Status:     Code Status Orders        Start     Ordered   01/28/17 1407  Limited resuscitation (code)  Continuous    Question Answer Comment   In the event of cardiac or respiratory ARREST: Initiate Code Blue, Call Rapid Response Yes   In the event of cardiac or respiratory ARREST: Perform CPR Yes   In the event of cardiac or respiratory ARREST: Perform Intubation/Mechanical Ventilation No   In the event of cardiac or respiratory ARREST: Use NIPPV/BiPAp only if indicated Yes   In the event of cardiac or respiratory ARREST: Administer ACLS medications if indicated Yes   In the event of cardiac or respiratory ARREST: Perform Defibrillation or Cardioversion if indicated Yes      01/28/17 1408    Code Status History    Date Active Date Inactive Code Status Order ID Comments User Context   01/24/2017  1:56 PM 01/25/2017  4:17 PM Full Code [id number]  Enedina Finner, MD Inpatient   01/15/2017 12:37 AM 01/16/2017  4:17 PM Full Code [id number]  Oralia Manis, MD Inpatient   01/07/2017  7:48 PM 01/09/2017  6:41 PM Full Code [id number]  Altamese Dilling, MD ED   09/05/2016  6:08 PM 09/07/2016  7:37 PM Full Code [id number]  Marguarite Arbour, MD Inpatient   08/14/2016 10:48 AM 08/19/2016  7:57 PM Full Code [id number]  Arnaldo Natal, MD Inpatient   08/06/2016 11:40 PM 08/07/2016  4:34 PM Full Code [id number]  Tonye Royalty, DO Inpatient   05/23/2015 10:14 PM 05/25/2015  2:59 PM Full Code [id number]  Altamese Dilling, MD Inpatient     Disposition Plan: Will need rehabilitation once breathing better.  Consultants:  Critical care specialist  Infectious disease doctor  Antibiotics:  IV vancomycin  Time spent: 28 minutes. Spoke with son on the phone. He stated he'll come in today to talk a little bit more.  Alford Highland  [employer]

## 2017-02-06 NOTE — Progress Notes (Signed)
Patient having difficulty maintaining stable 02 sat. Patient on 02/[place] @6  L early in shift, 02 sats dropping in the 70-80 range. Non re-breather mask placed by RT patient able to maintain 80-90's sats, Patient refused Bi Pap at bedtime. Bi Pap placed on patient approx. 23:00 due to sats dropping in low 70's. Patient insists on standing at bedside to void, sats drop to 60's. Encouraged patient to sit on side of bed to void. Patient continues want to  stand.  Medicated early in shift for cough and c/o arthritic pain in right upper arm and left side chest discomfort. Patient states the discomfort is from coughing. Patient did obtain relief from cough for most of night.  Isolation precautions maintained. Tele SR-ST.

## 2017-02-06 NOTE — Progress Notes (Signed)
   Name: Emeterio ReeveHarmon L Stormer MRN: [id number] DOB: 11/02/1955    ADMISSION DATE:  01/28/2017  CONSULTATION DATE: 6/19  REFERRING MD :  Dr. Anne HahnWillis  CHIEF COMPLAINT: Altered mental status, Hypoxia  BRIEF PATIENT DESCRIPTION: 61 year old male initially admitted with COPD.  Now transferred from the floor due to altered mental status and hypoxia  due to Pneumonia  SIGNIFICANT EVENTS  6/15 Patient admitted with COPD Exacerbation 6/19 Patient transferred from the floor due to altered , mental status and hypoxia due to PNA  STUDIES:  None  SUBJECTIVE:   Patient was transferred from the intensive care unit yesterday. He states he was BiPAP last night. Has been complaining of some increasing shortness of breath with increasing FiO2 requirements. States he received a breathing treatment this morning. Is presently on albuterol, Atrovent, budesonide, Solu-Medrol. Still complaining of wheezing.  VITAL SIGNS: Temp:  [97.7 F (36.5 C)-99.7 F (37.6 C)] 98.1 F (36.7 C) (06/24 0808) Pulse Rate:  [102-116] 107 (06/24 0808) Resp:  [18-29] 29 (06/24 0808) BP: (109-124)/(64-75) 122/75 (06/24 0808) SpO2:  [83 %-100 %] 100 % (06/24 0808) FiO2 (%):  [50 %] 50 % (06/24 0225) Weight:  [82.6 kg (182 lb 1.6 oz)] 82.6 kg (182 lb 1.6 oz) (06/24 0358)  PHYSICAL EXAMINATION: General: Patient presently on nasal cannula, complaining of shortness of breath Neuro:   follows commands, alert and oriented HEENT:   AT,[place],No JVD, Cardiovascular:  Tachycardic,s1s2,nom/r/g Lungs:  expiratory wheezes and rhonchi noted Abdomen:  soft,NT,ND Musculoskeletal:  No edema,cyanosis  Skin:  Warm,dry and intact   Recent Labs Lab 02/04/17 0453 02/05/17 0357 02/06/17 0441  NA 133* 135 136  K 3.7 3.6 3.4*  CL 100* 102 101  CO2 26 27 29   BUN 18 18 13   CREATININE 0.76 0.74 0.78  GLUCOSE 84 114* 107*    Recent Labs Lab 02/04/17 0453 02/05/17 0357 02/06/17 0441  HGB 9.3* 9.0* 9.4*  HCT 27.3* 26.5* 27.7*  WBC 22.2*  19.8* 16.6*  PLT 237 247 259   Dg Chest Port 1 View  Result Date: 02/05/2017 CLINICAL DATA:  Acute respiratory failure. EXAM: PORTABLE CHEST 1 VIEW COMPARISON:  02/04/2017 FINDINGS: The patient is rotated to the left. The cardiomediastinal silhouette is grossly unchanged allowing for rotation. There is persistent dense airspace consolidation with air bronchograms in the left upper lobe, at most mildly improved. Coarse opacities diffusely throughout the right lung have not significantly changed. No sizable pleural effusion or pneumothorax is identified. IMPRESSION: Extensive bilateral lung opacities including dense left upper lobe consolidation compatible with pneumonia, at most mildly improved. Electronically Signed   By: Sebastian AcheAllen  Grady M.D.   On: 02/05/2017 08:29   ASSESSMENT / PLAN: Severe COPD. Patient with increasing shortness of breath and bronchospasm, will change to Solu-Medrol 60 every 6 MRSA PNA with bacteremia on vancomycin. Continue albuterol, Atrovent, budesonide, patient is a DO NOT INTUBATE. We'll continue BiPAP as needed. If status worsens may need to transfer activity intensive care unit    Tora KindredJohn Adya Wirz, DO Mobile [phone] Pager [phone] 02/06/2017 10:06 AM

## 2017-02-06 NOTE — Progress Notes (Signed)
Physical Therapy Re-Evaluation Patient Details Name: Kristopher Powell L Artus MRN: [id number] DOB: 02/08/1956 Today's Date: 02/06/2017   History of Present Illness  Patient is a 61 y.o. male, admitted on 15 JUN with COPD exacerbation. Prior to admission, patient independent with ADL's and utilized RW for navigation within home/community. Per patient report, he utilizes 4.5 L O2 24/7 but has not been able to do so recently, secondary to no electricity within the home. PMH includes the following: tobacco use, COPD, chronic respiratory failure on 4L O2, neuropathy, GERD, depression, CHF, DMII, HTN, and MI. Patient transferred to ICU 19 JUN due to AMS and worsening O2 saturations/HR and found to have worsening pneumonia.   Clinical Impression  Patient is a pleasant male admitted for above listed reasons with above complications since admission. At rest, patient on 6L O2, breathing through mouth with resting O2 saturations 86%. MD and RN in room following subjective statements, agreeing that sitting in chair may be better for perfusion. Patient transferred with RW with CGA and mild unsteadiness. Upon sitting, oxygen saturations dropped to upper 70s and HR increased ~115 bpm. With cueing for inhaling through nose, vitals returned to resting 88% oxygen saturations within 2 minutes. Patient is not at baseline level of function and shows impairments of generalized weakness, decreased cardiopulmonary endurance, and function. Patient will benefit from skilled and progressive PT both within the hospital and upon discharge when medically ready to return to PLOF.    Follow Up Recommendations SNF    Equipment Recommendations  None recommended by PT    Recommendations for Other Services       Precautions / Restrictions Precautions Precautions: Fall Restrictions Weight Bearing Restrictions: No      Mobility  Bed Mobility Overal bed mobility: Modified Independent             General bed mobility comments:  Moves from supine to sit with use of bed rails.  Transfers Overall transfer level: Modified independent Equipment used: Rolling walker (2 wheeled)             General transfer comment: Patient moves from sit to stand and bed to chair with mild unsteadiness but no LOB.  Ambulation/Gait             General Gait Details: No assessed due to oxygen saturations  Stairs            Wheelchair Mobility    Modified Rankin (Stroke Patients Only)       Balance Overall balance assessment: Needs assistance;History of Falls Sitting-balance support: Feet supported Sitting balance-Leahy Scale: Good     Standing balance support: Bilateral upper extremity supported Standing balance-Leahy Scale: Fair                               Pertinent Vitals/Pain Pain Assessment: Faces Faces Pain Scale: Hurts a little bit Pain Location: L lower abdomen Pain Descriptors / Indicators: Sore Pain Intervention(s): Limited activity within patient's tolerance;Monitored during session    Home Living Family/patient expects to be discharged to:: Private residence Living Arrangements: Children Available Help at Discharge: Family Type of Home: House Home Access: Level entry     Home Layout: One level Home Equipment: Environmental consultantWalker - 2 wheels;Cane - single point      Prior Function Level of Independence: Independent with assistive device(s)               Hand Dominance        Extremity/Trunk  Assessment   Upper Extremity Assessment Upper Extremity Assessment: Generalized weakness    Lower Extremity Assessment Lower Extremity Assessment: Generalized weakness       Communication   Communication: No difficulties  Cognition Arousal/Alertness: Awake/alert Behavior During Therapy: WFL for tasks assessed/performed Overall Cognitive Status: Within Functional Limits for tasks assessed                                        General Comments       Exercises     Assessment/Plan    PT Assessment Patient needs continued PT services  PT Problem List Decreased strength;Decreased activity tolerance;Decreased balance;Decreased mobility;Decreased knowledge of use of DME;Decreased safety awareness;Cardiopulmonary status limiting activity       PT Treatment Interventions Gait training;Functional mobility training;Therapeutic activities;Therapeutic exercise;Balance training;Patient/family education    PT Goals (Current goals can be found in the Care Plan section)  Acute Rehab PT Goals Patient Stated Goal: "To feel better" PT Goal Formulation: With patient Time For Goal Achievement: 02/20/17 Potential to Achieve Goals: Good    Frequency Min 2X/week   Barriers to discharge Decreased caregiver support      Co-evaluation               AM-PAC PT "6 Clicks" Daily Activity  Outcome Measure Difficulty turning over in bed (including adjusting bedclothes, sheets and blankets)?: A Little Difficulty moving from lying on back to sitting on the side of the bed? : A Little Difficulty sitting down on and standing up from a chair with arms (e.g., wheelchair, bedside commode, etc,.)?: A Little Help needed moving to and from a bed to chair (including a wheelchair)?: A Little Help needed walking in hospital room?: A Lot Help needed climbing 3-5 steps with a railing? : A Lot 6 Click Score: 16    End of Session Equipment Utilized During Treatment: Gait belt;Oxygen Activity Tolerance: Treatment limited secondary to medical complications (Comment);Other (comment) (Oxygen saturations) Patient left: in chair;with call bell/phone within reach;with chair alarm set   PT Visit Diagnosis: Muscle weakness (generalized) (M62.81);Difficulty in walking, not elsewhere classified (R26.2)    Time: 1610-9604 PT Time Calculation (min) (ACUTE ONLY): 24 min   Charges:   PT Evaluation $PT Re-evaluation: 1 Procedure     PT G Codes:         [name], PT, DPT 02/06/2017, 9:51 AM

## 2017-02-07 LAB — BLOOD GAS, ARTERIAL
Acid-Base Excess: 3.7 mmol/L — ABNORMAL HIGH (ref 0.0–2.0)
Bicarbonate: 28.5 mmol/L — ABNORMAL HIGH (ref 20.0–28.0)
FIO2: 0.75
O2 Saturation: 98.5 %
PCO2 ART: 43 mmHg (ref 32.0–48.0)
PH ART: 7.43 (ref 7.350–7.450)
PO2 ART: 111 mmHg — AB (ref 83.0–108.0)
Patient temperature: 37

## 2017-02-07 LAB — GLUCOSE, CAPILLARY
Glucose-Capillary: 191 mg/dL — ABNORMAL HIGH (ref 65–99)
Glucose-Capillary: 195 mg/dL — ABNORMAL HIGH (ref 65–99)
Glucose-Capillary: 244 mg/dL — ABNORMAL HIGH (ref 65–99)
Glucose-Capillary: 219 mg/dL — ABNORMAL HIGH (ref 65–99)

## 2017-02-07 LAB — VANCOMYCIN, TROUGH: VANCOMYCIN TR: 12 ug/mL — AB (ref 15–20)

## 2017-02-07 MED ORDER — VANCOMYCIN HCL 10 G IV SOLR
1500.0000 mg | Freq: Three times a day (TID) | INTRAVENOUS | Status: DC
  Administered 2017-02-07 – 2017-02-10 (×8): 1500 mg via INTRAVENOUS
  Filled 2017-02-07 (×11): qty 1500

## 2017-02-07 MED ORDER — IPRATROPIUM-ALBUTEROL 0.5-2.5 (3) MG/3ML IN SOLN
3.0000 mL | RESPIRATORY_TRACT | Status: DC
  Administered 2017-02-08 – 2017-02-11 (×15): 3 mL via RESPIRATORY_TRACT
  Filled 2017-02-07 (×20): qty 3

## 2017-02-07 MED ORDER — BETHANECHOL CHLORIDE 25 MG PO TABS
25.0000 mg | ORAL_TABLET | Freq: Once | ORAL | Status: DC
  Filled 2017-02-07: qty 1

## 2017-02-07 MED ORDER — TAMSULOSIN HCL 0.4 MG PO CAPS
0.4000 mg | ORAL_CAPSULE | Freq: Every day | ORAL | Status: DC
  Administered 2017-02-07 – 2017-02-11 (×5): 0.4 mg via ORAL
  Filled 2017-02-07 (×5): qty 1

## 2017-02-07 MED ORDER — METHYLPREDNISOLONE SODIUM SUCC 125 MG IJ SOLR
60.0000 mg | Freq: Two times a day (BID) | INTRAMUSCULAR | Status: DC
  Administered 2017-02-08 – 2017-02-09 (×3): 60 mg via INTRAVENOUS
  Filled 2017-02-07 (×3): qty 2

## 2017-02-07 NOTE — Progress Notes (Signed)
   Name: Kristopher ReeveHarmon L Powell MRN: [id number] DOB: 10/30/1955    ADMISSION DATE:  01/28/2017  CONSULTATION DATE: 6/19  REFERRING MD :  Dr. Anne HahnWillis  CHIEF COMPLAINT: Altered mental status, Hypoxia  BRIEF PATIENT DESCRIPTION: 61 year old male initially admitted with COPD.  Now transferred from the floor due to altered mental status and hypoxia  due to Pneumonia  SIGNIFICANT EVENTS  6/15 Patient admitted with COPD Exacerbation 6/19 Patient transferred from the floor due to altered , mental status and hypoxia due to PNA  STUDIES:  None  SUBJECTIVE:   Has been complaining of some increasing shortness of breath with increasing FiO2 requirements.  Remains SOB +wheezing On high flow [place] at fio2 at 75%   VITAL SIGNS: Temp:  [98 F (36.7 C)-98.6 F (37 C)] 98 F (36.7 C) (06/25 0753) Pulse Rate:  [75-88] 75 (06/25 1139) Resp:  [16-24] 18 (06/25 1139) BP: (112-139)/(58-83) 112/58 (06/25 1139) SpO2:  [91 %-100 %] 99 % (06/25 1139) FiO2 (%):  [70 %] 70 % (06/25 0358) Weight:  [181 lb 3.2 oz (82.2 kg)] 181 lb 3.2 oz (82.2 kg) (06/25 0433)  PHYSICAL EXAMINATION: General: High flow [place], complaining of shortness of breath Neuro:   follows commands, alert and oriented HEENT:   AT,[place],No JVD, Cardiovascular:  Tachycardic,s1s2,nom/r/g Lungs:  expiratory wheezes and rhonchi noted Abdomen:  soft,NT,ND Musculoskeletal:  No edema,cyanosis  Skin:  Warm,dry and intact   Recent Labs Lab 02/04/17 0453 02/05/17 0357 02/06/17 0441  NA 133* 135 136  K 3.7 3.6 3.4*  CL 100* 102 101  CO2 26 27 29   BUN 18 18 13   CREATININE 0.76 0.74 0.78  GLUCOSE 84 114* 107*    Recent Labs Lab 02/04/17 0453 02/05/17 0357 02/06/17 0441  HGB 9.3* 9.0* 9.4*  HCT 27.3* 26.5* 27.7*  WBC 22.2* 19.8* 16.6*  PLT 237 247 259   No results found. ASSESSMENT / PLAN: Severe COPD with acute exacerbation from MRSA pneumonia  Patient with increasing shortness of breath and bronchospasm  1. Continue  Solu-Medrol  60 every 6 2.dounebs every 4 hrs 3.pulmicort nebs 4.DNI 5.ABX as prescribed   Patient satisfied with Plan of action and management. All questions answered. Will follow along  Lucie LeatherKurian David Anusha Claus, M.D.  [facility]  Medical Director [facility] Medical Director [facility]

## 2017-02-07 NOTE — Progress Notes (Signed)
Informed from IV team that PICC RN not available until tomorrow morning at 11am.

## 2017-02-07 NOTE — Progress Notes (Signed)
Pharmacy Antibiotic Note  Emeterio ReeveHarmon L Brakebill is a 61 y.o. male admitted on 01/28/2017 with pneumonia. Pharmacy has been consulted for vancomycin dosing for MRSA bacteremia and PNA with planned duration of abx x 4 weeks. Stop date 7/19 per ID.  Plan: Vancomycin level resulted @ 12 despite being drawn 6.5 hours post dose. Will increase Vancomycin dose to 1500 mg IV q8 hours and will recheck Vancomycin level 2330 on 6/27.   Height: 6' (182.9 cm) Weight: 181 lb 3.2 oz (82.2 kg) IBW/kg (Calculated) : 77.6  Temp (24hrs), Avg:98.4 F (36.9 C), Min:98 F (36.7 C), Max:98.6 F (37 C)   Recent Labs Lab 02/01/17 2151  02/03/17 0317 02/03/17 0409  02/04/17 0453  02/05/17 0357 02/05/17 0744 02/06/17 0441 02/07/17 1656  WBC 17.3*  --   --   --   --  22.2*  --  19.8*  --  16.6*  --   CREATININE 1.43*  --   --  0.85  --  0.76  --  0.74  --  0.78  --   LATICACIDVEN 4.7*  --   --   --   --   --   --   --   --   --   --   VANCOTROUGH  --   < >  --   --   < >  --   < >  --  17  --  12*  VANCORANDOM  --   --  19  --   --   --   --   --   --   --   --   < > = values in this interval not displayed.  Estimated Creatinine Clearance: 106.4 mL/min (by C-G formula based on SCr of 0.78 mg/dL).    No Known Allergies   Demetrius Charityeldrin D. Thaison Kolodziejski, PharmD  Clinical Pharmacist  02/07/2017 6:33 PM

## 2017-02-07 NOTE — Progress Notes (Signed)
Patient having no output, bladder scan completed x2, showed , MD notified. Orders given for In/Out cath, will complete and continue to monitor.

## 2017-02-07 NOTE — Progress Notes (Signed)
Physical Therapy Treatment Patient Details Name: Kristopher Powell MRN: [id number] DOB: November 03, 1955 Today's Date: 02/07/2017    History of Present Illness Patient is a 61 y.o. male, admitted on 15 JUN with COPD exacerbation. Prior to admission, patient independent with ADL's and utilized RW for navigation within home/community. Per patient report, he utilizes 4.5 L O2 24/7 but has not been able to do so recently, secondary to no electricity within the home. PMH includes the following: tobacco use, COPD, chronic respiratory failure on 4L O2, neuropathy, GERD, depression, CHF, DMII, HTN, and MI. Patient transferred to ICU 19 JUN due to AMS and worsening O2 saturations/HR and found to have worsening pneumonia.     PT Comments    Pt is a pleasant 61 y.o. M, admitted to acute care for COPD exacerbation. Pt performs bed mobility with supervision secondary to c/o dizziness, increase time, and use of bed rail. Tranfers and ambulation not attempted due to safety concerns from c/o SOB and dizziness that did not dissipate with breathing through nose when sitting at EOB. Pt O2 sats at 79% when achieving seated position at EOB, which quickly rose to >90% once seated: pt on HFNC at 49L supplemental O2 throughout duration of treatment session. Pt cont to present with the following deficits: strength and cardiorespiratory endurance. Overall, pt responded well to today's treatment with no adverse affects. Pt would benefit from continued skilled PT to address the previously mentioned impairments and promote return to PLOF. Currently recommending SNF, pending d/c     Follow Up Recommendations  SNF     Equipment Recommendations  None recommended by PT    Recommendations for Other Services       Precautions / Restrictions Precautions Precautions: Fall Restrictions Weight Bearing Restrictions: No    Mobility  Bed Mobility Overal bed mobility: Needs Assistance Bed Mobility: Supine to Sit     Supine to sit:  Supervision     General bed mobility comments: Supervision with supine to sit. Pt is now requiring increased time and bed railing to acheive sitting position. Pt reports dizziness upon sitting that does not dissipate.  Transfers                 General transfer comment: Not attempted due to safety concerns.  Ambulation/Gait             General Gait Details: Not attempted eue to safety concerns.    Stairs            Wheelchair Mobility    Modified Rankin (Stroke Patients Only)       Balance                                            Cognition Arousal/Alertness: Awake/alert Behavior During Therapy: WFL for tasks assessed/performed Overall Cognitive Status: Within Functional Limits for tasks assessed                                 General Comments: Pt quiet, but tolerated therapy well. Willing to participate.       Exercises Other Exercises Other Exercises: Supine therex with supervision on HFNC 45L supplemental O2 x10 reps to B LE's: ankle pumps, SLR, quad sets, glute sets, hip abd. O2 sats dropped to 89% 1x, but recovered to >90% when SPT encouraged pt to breath through  nose.     General Comments        Pertinent Vitals/Pain Pain Assessment: 0-10 Pain Score: 9  Pain Location: L chest (Per pt, due to excessive coughing) Pain Descriptors / Indicators: Sore;Constant Pain Intervention(s): Limited activity within patient's tolerance;Monitored during session    Home Living                      Prior Function            PT Goals (current goals can now be found in the care plan section) Acute Rehab PT Goals Patient Stated Goal: "To feel better" PT Goal Formulation: With patient Time For Goal Achievement: 02/20/17 Potential to Achieve Goals: Good Progress towards PT goals: Progressing toward goals    Frequency    Min 2X/week      PT Plan Current plan remains appropriate    Co-evaluation               AM-PAC PT "6 Clicks" Daily Activity  Outcome Measure  Difficulty turning over in bed (including adjusting bedclothes, sheets and blankets)?: A Little Difficulty moving from lying on back to sitting on the side of the bed? : A Little Difficulty sitting down on and standing up from a chair with arms (e.g., wheelchair, bedside commode, etc,.)?: A Little Help needed moving to and from a bed to chair (including a wheelchair)?: A Little Help needed walking in hospital room?: A Lot Help needed climbing 3-5 steps with a railing? : A Lot 6 Click Score: 16    End of Session Equipment Utilized During Treatment: Oxygen Activity Tolerance: Treatment limited secondary to medical complications (Comment);Other (comment) Patient left: in bed;with call bell/phone within reach;with bed alarm set Nurse Communication: Mobility status PT Visit Diagnosis: Muscle weakness (generalized) (M62.81);Difficulty in walking, not elsewhere classified (R26.2)     Time: [id number] PT Time Calculation (min) (ACUTE ONLY): 21 min  Charges:                       G Codes:       Kristopher CheekLaura Chrisoula Powell PT, SPT   Kristopher Powell 02/07/2017, 5:27 PM

## 2017-02-07 NOTE — Progress Notes (Signed)
Patient ID: Kristopher Powell, male   DOB: [birth year], 61 y.o.   MRN: [id number]   [employer] PROGRESS NOTE  Kristopher Powell:[id number] DOB: 1956/04/01 DOA: 01/28/2017 PCP: Candie Chroman, DO  HPI/Subjective: Patient working to breathe this morning. He was on BiPAP last night and flipped over to 6 L nasal cannula this morning. Using sensory muscle bottles and audible wheezing. Patient to short of breath and wheezing and coughing.  Objective: Vitals:   02/07/17 0753 02/07/17 1139  BP: 130/83 (!) 112/58  Pulse: 88 75  Resp: (!) 24 18  Temp: 98 F (36.7 C)     Filed Weights   02/05/17 0500 02/06/17 0358 02/07/17 0433  Weight: 79.3 kg (174 lb 13.2 oz) 82.6 kg (182 lb 1.6 oz) 82.2 kg (181 lb 3.2 oz)    ROS: Review of Systems  Unable to perform ROS: Acuity of condition  Respiratory: Positive for cough, shortness of breath and wheezing.   Cardiovascular: Negative for chest pain.  Gastrointestinal: Negative for abdominal pain.   Exam: Physical Exam  Constitutional: He is oriented to person, place, and time.  HENT:  Nose: No mucosal edema.  Mouth/Throat: No oropharyngeal exudate or posterior oropharyngeal edema.  Eyes: Conjunctivae, EOM and lids are normal. Pupils are equal, round, and reactive to light.  Neck: No JVD present. Carotid bruit is not present. No edema present. No thyroid mass and no thyromegaly present.  Cardiovascular: S1 normal, S2 normal and normal heart sounds.  Tachycardia present.  Exam reveals no gallop.   No murmur heard. Pulses:      Dorsalis pedis pulses are 2+ on the right side, and 2+ on the left side.  Respiratory: Accessory muscle usage present. No respiratory distress. He has decreased breath sounds in the right middle field, the right lower field, the left middle field and the left lower field. He has wheezes in the right middle field, the right lower field, the left middle field and the left lower field. He has no rhonchi. He has no rales.   GI: Soft. Bowel sounds are normal. There is no tenderness.  Musculoskeletal:       Right ankle: He exhibits no swelling.       Left ankle: He exhibits no swelling.  Lymphadenopathy:    He has no cervical adenopathy.  Neurological: He is alert and oriented to person, place, and time. No cranial nerve deficit.  Skin: Skin is warm. No rash noted. Nails show no clubbing.  Psychiatric: He has a normal mood and affect.      Data Reviewed: Basic Metabolic Panel:  Recent Labs Lab 02/01/17 0409 02/01/17 2151 02/02/17 0723 02/03/17 0409 02/03/17 1328 02/04/17 0453 02/05/17 0357 02/06/17 0441  NA  --  133*  --   --   --  133* 135 136  K  --  4.0  --   --   --  3.7 3.6 3.4*  CL  --  99*  --   --   --  100* 102 101  CO2  --  24  --   --   --  26 27 29   GLUCOSE  --  105*  --   --   --  84 114* 107*  BUN  --  22*  --   --   --  18 18 13   CREATININE  --  1.43*  --  0.85  --  0.76 0.74 0.78  CALCIUM  --  8.4*  --   --   --  7.6* 7.8* 7.5*  MG 2.1  --  1.5*  --  1.8  --  2.2  --   PHOS 4.1  --  5.2*  --   --   --  3.4  --    Liver Function Tests:  Recent Labs Lab 02/01/17 2151  AST 78*  ALT 84*  ALKPHOS 84  BILITOT 0.8  PROT 5.7*  ALBUMIN 3.2*   CBC:  Recent Labs Lab 02/01/17 2151 02/04/17 0453 02/05/17 0357 02/06/17 0441  WBC 17.3* 22.2* 19.8* 16.6*  NEUTROABS 12.6*  --   --   --   HGB 11.7* 9.3* 9.0* 9.4*  HCT 34.4* 27.3* 26.5* 27.7*  MCV 82.9 81.6 82.1 82.0  PLT 359 237 247 259   Cardiac Enzymes:  Recent Labs Lab 02/01/17 2151 02/02/17 1342  TROPONINI <0.03 <0.03   BNP (last 3 results)  Recent Labs  01/07/17 1554 01/14/17 1901 01/24/17 1044  BNP 15.0 23.0 11.0    CBG:  Recent Labs Lab 02/06/17 1216 02/06/17 1635 02/06/17 2115 02/07/17 0739 02/07/17 1140  GLUCAP 157* 174* 184* 191* 195*    Recent Results (from the past 240 hour(s))  Culture, blood (Routine X 2) w Reflex to ID Panel     Status: None   Collection Time: 02/01/17  9:51 PM   Result Value Ref Range Status   Specimen Description BLOOD BLOOD RIGHT ARM  Final   Special Requests   Final    BOTTLES DRAWN AEROBIC AND ANAEROBIC Blood Culture results may not be optimal due to an excessive volume of blood received in culture bottles   Culture NO GROWTH 5 DAYS  Final   Report Status 02/06/2017 FINAL  Final  Culture, blood (Routine X 2) w Reflex to ID Panel     Status: Abnormal   Collection Time: 02/01/17  9:51 PM  Result Value Ref Range Status   Specimen Description BLOOD BLOOD LEFT HAND  Final   Special Requests   Final    BOTTLES DRAWN AEROBIC AND ANAEROBIC Blood Culture adequate volume   Culture  Setup Time   Final    IN BOTH AEROBIC AND ANAEROBIC BOTTLES GRAM POSITIVE COCCI CRITICAL RESULT CALLED TO, READ BACK BY AND VERIFIED WITH: DAVID BESANTI AT 2132 ON 02/02/17 BY SNJ Performed at [facility], [address], [place], [place] [address]    Culture METHICILLIN RESISTANT STAPHYLOCOCCUS AUREUS (A)  Final   Report Status 02/05/2017 FINAL  Final   Organism ID, Bacteria METHICILLIN RESISTANT STAPHYLOCOCCUS AUREUS  Final      Susceptibility   Methicillin resistant staphylococcus aureus - MIC*    CIPROFLOXACIN >=8 RESISTANT Resistant     ERYTHROMYCIN >=8 RESISTANT Resistant     GENTAMICIN <=0.5 SENSITIVE Sensitive     OXACILLIN >=4 RESISTANT Resistant     TETRACYCLINE <=1 SENSITIVE Sensitive     VANCOMYCIN <=0.5 SENSITIVE Sensitive     TRIMETH/SULFA <=10 SENSITIVE Sensitive     CLINDAMYCIN <=0.25 SENSITIVE Sensitive     RIFAMPIN <=0.5 SENSITIVE Sensitive     Inducible Clindamycin NEGATIVE Sensitive     * METHICILLIN RESISTANT STAPHYLOCOCCUS AUREUS  Blood Culture ID Panel (Reflexed)     Status: Abnormal   Collection Time: 02/01/17  9:51 PM  Result Value Ref Range Status   Enterococcus species NOT DETECTED NOT DETECTED Final   Listeria monocytogenes NOT DETECTED NOT DETECTED Final   Staphylococcus species DETECTED (A) NOT DETECTED Final    Comment:  CRITICAL RESULT CALLED TO, READ BACK BY  AND VERIFIED WITH: DAVID BESANTI AT 2132 ON 02/02/17 BY SNJ    Staphylococcus aureus DETECTED (A) NOT DETECTED Final    Comment: Methicillin (oxacillin)-resistant Staphylococcus aureus (MRSA). MRSA is predictably resistant to beta-lactam antibiotics (except ceftaroline). Preferred therapy is vancomycin unless clinically contraindicated. Patient requires contact precautions if  hospitalized. CRITICAL RESULT CALLED TO, READ BACK BY AND VERIFIED WITH: DAVID BESANTI AT 2132 ON 02/02/17 BY SNJ    Methicillin resistance DETECTED (A) NOT DETECTED Final    Comment: CRITICAL RESULT CALLED TO, READ BACK BY AND VERIFIED WITH: DAVID BESANTI AT 2132 ON 02/02/17 BY SNJ    Streptococcus species NOT DETECTED NOT DETECTED Final   Streptococcus agalactiae NOT DETECTED NOT DETECTED Final   Streptococcus pneumoniae NOT DETECTED NOT DETECTED Final   Streptococcus pyogenes NOT DETECTED NOT DETECTED Final   Acinetobacter baumannii NOT DETECTED NOT DETECTED Final   Enterobacteriaceae species NOT DETECTED NOT DETECTED Final   Enterobacter cloacae complex NOT DETECTED NOT DETECTED Final   Escherichia coli NOT DETECTED NOT DETECTED Final   Klebsiella oxytoca NOT DETECTED NOT DETECTED Final   Klebsiella pneumoniae NOT DETECTED NOT DETECTED Final   Proteus species NOT DETECTED NOT DETECTED Final   Serratia marcescens NOT DETECTED NOT DETECTED Final   Haemophilus influenzae NOT DETECTED NOT DETECTED Final   Neisseria meningitidis NOT DETECTED NOT DETECTED Final   Pseudomonas aeruginosa NOT DETECTED NOT DETECTED Final   Candida albicans NOT DETECTED NOT DETECTED Final   Candida glabrata NOT DETECTED NOT DETECTED Final   Candida krusei NOT DETECTED NOT DETECTED Final   Candida parapsilosis NOT DETECTED NOT DETECTED Final   Candida tropicalis NOT DETECTED NOT DETECTED Final  MRSA PCR Screening     Status: Abnormal   Collection Time: 02/01/17 11:28 PM  Result Value Ref Range  Status   MRSA by PCR POSITIVE (A) NEGATIVE Final    Comment:        The GeneXpert MRSA Assay (FDA approved for NASAL specimens only), is one component of a comprehensive MRSA colonization surveillance program. It is not intended to diagnose MRSA infection nor to guide or monitor treatment for MRSA infections. RESULT CALLED TO, READ BACK BY AND VERIFIED WITH: TESS THOMAS @ 0112 ON 02/02/2017 BY CAF   Culture, expectorated sputum-assessment     Status: None   Collection Time: 02/02/17  2:27 AM  Result Value Ref Range Status   Specimen Description SPUTUM  Final   Special Requests NONE  Final   Sputum evaluation THIS SPECIMEN IS ACCEPTABLE FOR SPUTUM CULTURE  Final   Report Status 02/02/2017 FINAL  Final  Culture, respiratory (NON-Expectorated)     Status: None   Collection Time: 02/02/17  2:27 AM  Result Value Ref Range Status   Specimen Description SPUTUM  Final   Special Requests NONE Reflexed from [id number]  Final   Gram Stain   Final    ABUNDANT WBC PRESENT,BOTH PMN AND MONONUCLEAR ABUNDANT GRAM POSITIVE COCCI IN PAIRS IN CLUSTERS MODERATE GRAM VARIABLE ROD    Culture   Final    ABUNDANT METHICILLIN RESISTANT STAPHYLOCOCCUS AUREUS   Report Status 02/04/2017 FINAL  Final   Organism ID, Bacteria METHICILLIN RESISTANT STAPHYLOCOCCUS AUREUS  Final      Susceptibility   Methicillin resistant staphylococcus aureus - MIC*    CIPROFLOXACIN >=8 RESISTANT Resistant     ERYTHROMYCIN >=8 RESISTANT Resistant     GENTAMICIN <=0.5 SENSITIVE Sensitive     OXACILLIN >=4 RESISTANT Resistant     TETRACYCLINE <=1 SENSITIVE Sensitive  VANCOMYCIN 1 SENSITIVE Sensitive     TRIMETH/SULFA <=10 SENSITIVE Sensitive     CLINDAMYCIN <=0.25 SENSITIVE Sensitive     RIFAMPIN <=0.5 SENSITIVE Sensitive     Inducible Clindamycin NEGATIVE Sensitive     * ABUNDANT METHICILLIN RESISTANT STAPHYLOCOCCUS AUREUS  C difficile quick scan w PCR reflex     Status: None   Collection Time: 02/02/17  9:10 PM   Result Value Ref Range Status   C Diff antigen NEGATIVE NEGATIVE Final   C Diff toxin NEGATIVE NEGATIVE Final   C Diff interpretation No C. difficile detected.  Final  Culture, blood (Routine X 2) w Reflex to ID Panel     Status: None (Preliminary result)   Collection Time: 02/03/17  1:28 PM  Result Value Ref Range Status   Specimen Description BLOOD RIGHT HAND  Final   Special Requests   Final    BOTTLES DRAWN AEROBIC AND ANAEROBIC Blood Culture results may not be optimal due to an inadequate volume of blood received in culture bottles   Culture NO GROWTH 4 DAYS  Final   Report Status PENDING  Incomplete  Culture, blood (Routine X 2) w Reflex to ID Panel     Status: None (Preliminary result)   Collection Time: 02/03/17  1:36 PM  Result Value Ref Range Status   Specimen Description BLOOD RIGHT AC  Final   Special Requests   Final    BOTTLES DRAWN AEROBIC AND ANAEROBIC Blood Culture adequate volume   Culture NO GROWTH 4 DAYS  Final   Report Status PENDING  Incomplete      Scheduled Meds: . aspirin EC  325 mg Oral Daily  . budesonide (PULMICORT) nebulizer solution  0.5 mg Nebulization BID  . Chlorhexidine Gluconate Cloth  6 each Topical Q0600  . enoxaparin (LOVENOX) injection  40 mg Subcutaneous Q24H  . folic acid  1 mg Oral Daily  . gabapentin  1,200 mg Oral TID  . imipramine  200 mg Oral QHS  . insulin aspart  0-15 Units Subcutaneous TID WC  . insulin aspart  0-5 Units Subcutaneous QHS  . ipratropium-albuterol  3 mL Nebulization Q6H  . methylPREDNISolone (SOLU-MEDROL) injection  60 mg Intravenous Q6H  . metoprolol succinate  25 mg Oral Daily  . multivitamin with minerals  1 tablet Oral Daily  . pantoprazole  40 mg Oral Daily  . risperiDONE  4 mg Oral QHS  . sodium chloride flush  3 mL Intravenous Q12H  . tamsulosin  0.4 mg Oral Daily  . vitamin B-12  1,000 mcg Oral Daily   Continuous Infusions: . vancomycin Stopped (02/07/17 0931)    Assessment/Plan:  1. Acute on  chronic respiratory failure.  Continue high flow nasal cannula patient on 78% FiO2.  Abg does not show CO2 retention. Case discussed with critical care specialist Dr. Donella StadeKoss to continue to evaluate the patient.  2. MRSA sepsis with left-sided pneumonia. On IV vancomycin. Patient will need 4 weeks of IV antibiotics. Place PICC line.  Repeat blood cultures so far negative. 3. COPD exacerbation. Patient placed on high dose Solu-Medrol yesterday as per critical care specialist.. DuoNeb and budesonide nebulizers.  4. Chronic mild hyponatremia secondary to over drinking water. Sodium normal range 5. Steroid-induced hyperglycemia. Patient on sliding scale 6. Weakness physical therapy evaluation 7. Anemia of chronic disease 8. Urinary retention. I just put in a when necessary order for in and out catheterizations when the patient urinated 700 mL. Start Flomax.  Code Status:  Code Status Orders        Start     Ordered   01/28/17 1407  Limited resuscitation (code)  Continuous    Question Answer Comment  In the event of cardiac or respiratory ARREST: Initiate Code Blue, Call Rapid Response Yes   In the event of cardiac or respiratory ARREST: Perform CPR Yes   In the event of cardiac or respiratory ARREST: Perform Intubation/Mechanical Ventilation No   In the event of cardiac or respiratory ARREST: Use NIPPV/BiPAp only if indicated Yes   In the event of cardiac or respiratory ARREST: Administer ACLS medications if indicated Yes   In the event of cardiac or respiratory ARREST: Perform Defibrillation or Cardioversion if indicated Yes      01/28/17 1408    Code Status History    Date Active Date Inactive Code Status Order ID Comments User Context   01/24/2017  1:56 PM 01/25/2017  4:17 PM Full Code [id number]  Enedina Finner, MD Inpatient   01/15/2017 12:37 AM 01/16/2017  4:17 PM Full Code [id number]  Oralia Manis, MD Inpatient   01/07/2017  7:48 PM 01/09/2017  6:41 PM Full Code [id number]  Altamese Dilling, MD ED   09/05/2016  6:08 PM 09/07/2016  7:37 PM Full Code [id number]  Marguarite Arbour, MD Inpatient   08/14/2016 10:48 AM 08/19/2016  7:57 PM Full Code [id number]  Arnaldo Natal, MD Inpatient   08/06/2016 11:40 PM 08/07/2016  4:34 PM Full Code [id number]  Tonye Royalty, DO Inpatient   05/23/2015 10:14 PM 05/25/2015  2:59 PM Full Code [id number]  Altamese Dilling, MD Inpatient     Disposition Plan: Will need rehabilitation once breathing better.  Consultants:  Critical care specialist  Infectious disease  Antibiotics:  IV vancomycin  Time spent: 28 minutes. Spoke with son on the phone Yesterday. Unable to reach today.  Alford Highland  [employer]

## 2017-02-08 DIAGNOSIS — Z515 Encounter for palliative care: Secondary | ICD-10-CM

## 2017-02-08 DIAGNOSIS — Z7189 Other specified counseling: Secondary | ICD-10-CM

## 2017-02-08 LAB — CULTURE, BLOOD (ROUTINE X 2)
Culture: NO GROWTH
Culture: NO GROWTH
SPECIAL REQUESTS: ADEQUATE

## 2017-02-08 LAB — GLUCOSE, CAPILLARY
GLUCOSE-CAPILLARY: 152 mg/dL — AB (ref 65–99)
Glucose-Capillary: 95 mg/dL (ref 65–99)
Glucose-Capillary: 217 mg/dL — ABNORMAL HIGH (ref 65–99)
Glucose-Capillary: 147 mg/dL — ABNORMAL HIGH (ref 65–99)

## 2017-02-08 LAB — VANCOMYCIN, TROUGH: Vancomycin Tr: 19 ug/mL (ref 15–20)

## 2017-02-08 MED ORDER — SODIUM CHLORIDE 0.9% FLUSH: 10.0000 mL | INTRAVENOUS | Status: DC | PRN

## 2017-02-08 MED ORDER — NYSTATIN 100000 UNIT/ML MT SUSP
5.0000 mL | Freq: Four times a day (QID) | OROMUCOSAL | Status: DC
  Administered 2017-02-08 – 2017-02-11 (×12): 500000 [IU] via ORAL
  Filled 2017-02-08 (×10): qty 5

## 2017-02-08 NOTE — Progress Notes (Signed)
   Name: Kristopher ReeveHarmon L Powell MRN: [id number] DOB: 04/15/1956    ADMISSION DATE:  01/28/2017  CONSULTATION DATE: 6/19  REFERRING MD :  Dr. Anne HahnWillis  CHIEF COMPLAINT: Altered mental status, Hypoxia  BRIEF PATIENT DESCRIPTION: 61 year old male initially admitted with COPD.  Now transferred from the floor due to altered mental status and hypoxia  due to Pneumonia  SIGNIFICANT EVENTS  6/15 Patient admitted with COPD Exacerbation 6/19 Patient transferred from the floor due to altered , mental status and hypoxia due to PNA  STUDIES:  None  SUBJECTIVE:  Patient states he feels better today, but still SOB +wheezing +cough On high flow [place] at fio2 at 65%(yesterday at 75%)   VITAL SIGNS: Temp:  [97.5 F (36.4 C)-98.7 F (37.1 C)] 98.7 F (37.1 C) (06/26 0415) Pulse Rate:  [75-93] 81 (06/26 0415) Resp:  [18] 18 (06/26 0415) BP: (110-147)/(58-83) 110/65 (06/26 0415) SpO2:  [91 %-99 %] 91 % (06/26 0731) FiO2 (%):  [70 %] 70 % (06/26 0731) Weight:  [186 lb 11.2 oz (84.7 kg)] 186 lb 11.2 oz (84.7 kg) (06/26 0445)  PHYSICAL EXAMINATION: General: High flow [place], complaining of shortness of breath Neuro:   follows commands, alert and oriented HEENT:   AT,[place],No JVD, Cardiovascular:  Tachycardic,s1s2,nom/r/g Lungs:  expiratory wheezes and rhonchi noted Abdomen:  soft,NT,ND Musculoskeletal:  No edema,cyanosis  Skin:  Warm,dry and intact   Recent Labs Lab 02/04/17 0453 02/05/17 0357 02/06/17 0441  NA 133* 135 136  K 3.7 3.6 3.4*  CL 100* 102 101  CO2 26 27 29   BUN 18 18 13   CREATININE 0.76 0.74 0.78  GLUCOSE 84 114* 107*    Recent Labs Lab 02/04/17 0453 02/05/17 0357 02/06/17 0441  HGB 9.3* 9.0* 9.4*  HCT 27.3* 26.5* 27.7*  WBC 22.2* 19.8* 16.6*  PLT 237 247 259   No results found. ASSESSMENT / PLAN: Severe COPD with acute exacerbation from MRSA pneumonia-very slow  improvment   1.Continue  Solu-Medrol 60 every 6 2.dounebs changed to every 4 hrs 3.pulmicort  nebs 4.DNI 5.ABX as prescribed   Patient satisfied with Plan of action and management. All questions answered. Will follow along Continue medical therapy as prescribed  Lucie LeatherKurian David Cam Dauphin, M.D.  [facility]  Medical Director [facility] Medical Director [facility]

## 2017-02-08 NOTE — Progress Notes (Signed)
Patient ID: Kristopher Powell, male   DOB: 24-Oct-1955, 61 y.o.   MRN: [id number]  Patient ID: Kristopher Powell, male   DOB: 1956-04-25, 61 y.o.   MRN: [id number]   [facility] PROGRESS NOTE  Kristopher Powell:[id number] DOB: 1956-01-12 DOA: 01/28/2017 PCP: Kristopher Chroman, DO  HPI/Subjective: Patient still on high flow nasal cannula. FiO2 67% when I saw him. Still with some shortness of breath cough and wheezing. Still not feeling well. More alert today than yesterday.  Objective: Vitals:   02/08/17 0415 02/08/17 1203  BP: 110/65 (!) 141/75  Pulse: 81 99  Resp: 18 19  Temp: 98.7 F (37.1 C) 98.3 F (36.8 C)    Filed Weights   02/06/17 0358 02/07/17 0433 02/08/17 0445  Weight: 82.6 kg (182 lb 1.6 oz) 82.2 kg (181 lb 3.2 oz) 84.7 kg (186 lb 11.2 oz)    ROS: Review of Systems  Unable to perform ROS: Acuity of condition  Constitutional: Negative for fever.  Respiratory: Positive for cough, shortness of breath and wheezing.   Cardiovascular: Negative for chest pain.  Gastrointestinal: Negative for abdominal pain, nausea and vomiting.  Genitourinary: Negative for dysuria.  Musculoskeletal: Negative for joint pain and myalgias.  Neurological: Negative for dizziness.   Exam: Physical Exam  Constitutional: He is oriented to person, place, and time.  HENT:  Nose: No mucosal edema.  Mouth/Throat: No oropharyngeal exudate or posterior oropharyngeal edema.  Eyes: Conjunctivae, EOM and lids are normal. Pupils are equal, round, and reactive to light.  Neck: No JVD present. Carotid bruit is not present. No edema present. No thyroid mass and no thyromegaly present.  Cardiovascular: S1 normal, S2 normal and normal heart sounds.  Tachycardia present.  Exam reveals no gallop.   No murmur heard. Pulses:      Dorsalis pedis pulses are 2+ on the right side, and 2+ on the left side.  Respiratory: Accessory muscle usage present. No respiratory distress. He has decreased breath sounds  in the right lower field, the left middle field and the left lower field. He has wheezes in the right lower field, the left middle field and the left lower field. He has no rhonchi. He has no rales.  GI: Soft. Bowel sounds are normal. There is no tenderness.  Musculoskeletal:       Right ankle: He exhibits no swelling.       Left ankle: He exhibits no swelling.  Lymphadenopathy:    He has no cervical adenopathy.  Neurological: He is alert and oriented to person, place, and time. No cranial nerve deficit.  Skin: Skin is warm. No rash noted. Nails show no clubbing.  Psychiatric: He has a normal mood and affect.      Data Reviewed: Basic Metabolic Panel:  Recent Labs Lab 02/01/17 2151 02/02/17 0723 02/03/17 0409 02/03/17 1328 02/04/17 0453 02/05/17 0357 02/06/17 0441  NA 133*  --   --   --  133* 135 136  K 4.0  --   --   --  3.7 3.6 3.4*  CL 99*  --   --   --  100* 102 101  CO2 24  --   --   --  26 27 29   GLUCOSE 105*  --   --   --  84 114* 107*  BUN 22*  --   --   --  18 18 13   CREATININE 1.43*  --  0.85  --  0.76 0.74 0.78  CALCIUM 8.4*  --   --   --  7.6* 7.8* 7.5*  MG  --  1.5*  --  1.8  --  2.2  --   PHOS  --  5.2*  --   --   --  3.4  --    Liver Function Tests:  Recent Labs Lab 02/01/17 2151  AST 78*  ALT 84*  ALKPHOS 84  BILITOT 0.8  PROT 5.7*  ALBUMIN 3.2*   CBC:  Recent Labs Lab 02/01/17 2151 02/04/17 0453 02/05/17 0357 02/06/17 0441  WBC 17.3* 22.2* 19.8* 16.6*  NEUTROABS 12.6*  --   --   --   HGB 11.7* 9.3* 9.0* 9.4*  HCT 34.4* 27.3* 26.5* 27.7*  MCV 82.9 81.6 82.1 82.0  PLT 359 237 247 259   Cardiac Enzymes:  Recent Labs Lab 02/01/17 2151 02/02/17 1342  TROPONINI <0.03 <0.03   BNP (last 3 results)  Recent Labs  01/07/17 1554 01/14/17 1901 01/24/17 1044  BNP 15.0 23.0 11.0    CBG:  Recent Labs Lab 02/07/17 1140 02/07/17 1634 02/07/17 2046 02/08/17 0730 02/08/17 1205  GLUCAP 195* 244* 219* 95 217*    Recent Results  (from the past 240 hour(s))  Culture, blood (Routine X 2) w Reflex to ID Panel     Status: None   Collection Time: 02/01/17  9:51 PM  Result Value Ref Range Status   Specimen Description BLOOD BLOOD RIGHT ARM  Final   Special Requests   Final    BOTTLES DRAWN AEROBIC AND ANAEROBIC Blood Culture results may not be optimal due to an excessive volume of blood received in culture bottles   Culture NO GROWTH 5 DAYS  Final   Report Status 02/06/2017 FINAL  Final  Culture, blood (Routine X 2) w Reflex to ID Panel     Status: Abnormal   Collection Time: 02/01/17  9:51 PM  Result Value Ref Range Status   Specimen Description BLOOD BLOOD LEFT HAND  Final   Special Requests   Final    BOTTLES DRAWN AEROBIC AND ANAEROBIC Blood Culture adequate volume   Culture  Setup Time   Final    IN BOTH AEROBIC AND ANAEROBIC BOTTLES GRAM POSITIVE COCCI CRITICAL RESULT CALLED TO, READ BACK BY AND VERIFIED WITH: Kristopher Powell AT 2132 ON 02/02/17 BY SNJ Performed at [facility], [address], [place], [place] [address]    Culture METHICILLIN RESISTANT STAPHYLOCOCCUS AUREUS (A)  Final   Report Status 02/05/2017 FINAL  Final   Organism ID, Bacteria METHICILLIN RESISTANT STAPHYLOCOCCUS AUREUS  Final      Susceptibility   Methicillin resistant staphylococcus aureus - MIC*    CIPROFLOXACIN >=8 RESISTANT Resistant     ERYTHROMYCIN >=8 RESISTANT Resistant     GENTAMICIN <=0.5 SENSITIVE Sensitive     OXACILLIN >=4 RESISTANT Resistant     TETRACYCLINE <=1 SENSITIVE Sensitive     VANCOMYCIN <=0.5 SENSITIVE Sensitive     TRIMETH/SULFA <=10 SENSITIVE Sensitive     CLINDAMYCIN <=0.25 SENSITIVE Sensitive     RIFAMPIN <=0.5 SENSITIVE Sensitive     Inducible Clindamycin NEGATIVE Sensitive     * METHICILLIN RESISTANT STAPHYLOCOCCUS AUREUS  Blood Culture ID Panel (Reflexed)     Status: Abnormal   Collection Time: 02/01/17  9:51 PM  Result Value Ref Range Status   Enterococcus species NOT DETECTED NOT DETECTED  Final   Listeria monocytogenes NOT DETECTED NOT DETECTED Final   Staphylococcus species DETECTED (A) NOT DETECTED Final    Comment: CRITICAL RESULT CALLED TO, READ BACK BY AND VERIFIED  WITH: Kristopher Powell AT 2132 ON 02/02/17 BY SNJ    Staphylococcus aureus DETECTED (A) NOT DETECTED Final    Comment: Methicillin (oxacillin)-resistant Staphylococcus aureus (MRSA). MRSA is predictably resistant to beta-lactam antibiotics (except ceftaroline). Preferred therapy is vancomycin unless clinically contraindicated. Patient requires contact precautions if  hospitalized. CRITICAL RESULT CALLED TO, READ BACK BY AND VERIFIED WITH: Kristopher Powell AT 2132 ON 02/02/17 BY SNJ    Methicillin resistance DETECTED (A) NOT DETECTED Final    Comment: CRITICAL RESULT CALLED TO, READ BACK BY AND VERIFIED WITH: Kristopher Powell AT 2132 ON 02/02/17 BY SNJ    Streptococcus species NOT DETECTED NOT DETECTED Final   Streptococcus agalactiae NOT DETECTED NOT DETECTED Final   Streptococcus pneumoniae NOT DETECTED NOT DETECTED Final   Streptococcus pyogenes NOT DETECTED NOT DETECTED Final   Acinetobacter baumannii NOT DETECTED NOT DETECTED Final   Enterobacteriaceae species NOT DETECTED NOT DETECTED Final   Enterobacter cloacae complex NOT DETECTED NOT DETECTED Final   Escherichia coli NOT DETECTED NOT DETECTED Final   Klebsiella oxytoca NOT DETECTED NOT DETECTED Final   Klebsiella pneumoniae NOT DETECTED NOT DETECTED Final   Proteus species NOT DETECTED NOT DETECTED Final   Serratia marcescens NOT DETECTED NOT DETECTED Final   Haemophilus influenzae NOT DETECTED NOT DETECTED Final   Neisseria meningitidis NOT DETECTED NOT DETECTED Final   Pseudomonas aeruginosa NOT DETECTED NOT DETECTED Final   Candida albicans NOT DETECTED NOT DETECTED Final   Candida glabrata NOT DETECTED NOT DETECTED Final   Candida krusei NOT DETECTED NOT DETECTED Final   Candida parapsilosis NOT DETECTED NOT DETECTED Final   Candida tropicalis NOT  DETECTED NOT DETECTED Final  MRSA PCR Screening     Status: Abnormal   Collection Time: 02/01/17 11:28 PM  Result Value Ref Range Status   MRSA by PCR POSITIVE (A) NEGATIVE Final    Comment:        The GeneXpert MRSA Assay (FDA approved for NASAL specimens only), is one component of a comprehensive MRSA colonization surveillance program. It is not intended to diagnose MRSA infection nor to guide or monitor treatment for MRSA infections. RESULT CALLED TO, READ BACK BY AND VERIFIED WITH: TESS THOMAS @ 0112 ON 02/02/2017 BY CAF   Culture, expectorated sputum-assessment     Status: None   Collection Time: 02/02/17  2:27 AM  Result Value Ref Range Status   Specimen Description SPUTUM  Final   Special Requests NONE  Final   Sputum evaluation THIS SPECIMEN IS ACCEPTABLE FOR SPUTUM CULTURE  Final   Report Status 02/02/2017 FINAL  Final  Culture, respiratory (NON-Expectorated)     Status: None   Collection Time: 02/02/17  2:27 AM  Result Value Ref Range Status   Specimen Description SPUTUM  Final   Special Requests NONE Reflexed from [id number]  Final   Gram Stain   Final    ABUNDANT WBC PRESENT,BOTH PMN AND MONONUCLEAR ABUNDANT GRAM POSITIVE COCCI IN PAIRS IN CLUSTERS MODERATE GRAM VARIABLE ROD    Culture   Final    ABUNDANT METHICILLIN RESISTANT STAPHYLOCOCCUS AUREUS   Report Status 02/04/2017 FINAL  Final   Organism ID, Bacteria METHICILLIN RESISTANT STAPHYLOCOCCUS AUREUS  Final      Susceptibility   Methicillin resistant staphylococcus aureus - MIC*    CIPROFLOXACIN >=8 RESISTANT Resistant     ERYTHROMYCIN >=8 RESISTANT Resistant     GENTAMICIN <=0.5 SENSITIVE Sensitive     OXACILLIN >=4 RESISTANT Resistant     TETRACYCLINE <=1 SENSITIVE Sensitive  VANCOMYCIN 1 SENSITIVE Sensitive     TRIMETH/SULFA <=10 SENSITIVE Sensitive     CLINDAMYCIN <=0.25 SENSITIVE Sensitive     RIFAMPIN <=0.5 SENSITIVE Sensitive     Inducible Clindamycin NEGATIVE Sensitive     * ABUNDANT  METHICILLIN RESISTANT STAPHYLOCOCCUS AUREUS  C difficile quick scan w PCR reflex     Status: None   Collection Time: 02/02/17  9:10 PM  Result Value Ref Range Status   C Diff antigen NEGATIVE NEGATIVE Final   C Diff toxin NEGATIVE NEGATIVE Final   C Diff interpretation No C. difficile detected.  Final  Culture, blood (Routine X 2) w Reflex to ID Panel     Status: None   Collection Time: 02/03/17  1:28 PM  Result Value Ref Range Status   Specimen Description BLOOD RIGHT HAND  Final   Special Requests   Final    BOTTLES DRAWN AEROBIC AND ANAEROBIC Blood Culture results may not be optimal due to an inadequate volume of blood received in culture bottles   Culture NO GROWTH 5 DAYS  Final   Report Status 02/08/2017 FINAL  Final  Culture, blood (Routine X 2) w Reflex to ID Panel     Status: None   Collection Time: 02/03/17  1:36 PM  Result Value Ref Range Status   Specimen Description BLOOD RIGHT AC  Final   Special Requests   Final    BOTTLES DRAWN AEROBIC AND ANAEROBIC Blood Culture adequate volume   Culture NO GROWTH 5 DAYS  Final   Report Status 02/08/2017 FINAL  Final      Scheduled Meds: . aspirin EC  325 mg Oral Daily  . budesonide (PULMICORT) nebulizer solution  0.5 mg Nebulization BID  . enoxaparin (LOVENOX) injection  40 mg Subcutaneous Q24H  . folic acid  1 mg Oral Daily  . gabapentin  1,200 mg Oral TID  . imipramine  200 mg Oral QHS  . insulin aspart  0-15 Units Subcutaneous TID WC  . insulin aspart  0-5 Units Subcutaneous QHS  . ipratropium-albuterol  3 mL Nebulization Q4H  . methylPREDNISolone (SOLU-MEDROL) injection  60 mg Intravenous Q12H  . metoprolol succinate  25 mg Oral Daily  . multivitamin with minerals  1 tablet Oral Daily  . nystatin  5 mL Oral QID  . pantoprazole  40 mg Oral Daily  . risperiDONE  4 mg Oral QHS  . sodium chloride flush  3 mL Intravenous Q12H  . tamsulosin  0.4 mg Oral Daily  . vitamin B-12  1,000 mcg Oral Daily   Continuous Infusions: .  vancomycin Stopped (02/08/17 1055)    Assessment/Plan:  1. Acute on chronic respiratory failure.  Continue high flow nasal cannula patient on 67% FiO2. Patient will remain in the hospital on high flow nasal cannula. 2. MRSA sepsis with left-sided pneumonia. On IV vancomycin. Patient will need 4 weeks of IV antibiotics. Place PICC line.  Repeat blood cultures so far negative. 3. COPD exacerbation. Patient placed on high dose Solu-Medrol yesterday as per critical care specialist. DuoNeb and budesonide nebulizers.  4. Chronic mild hyponatremia secondary to over drinking water. Sodium normal range. 5. Steroid-induced hyperglycemia.  Patient on sliding scale. 6. Weakness physical therapy evaluation appreciated 7. Anemia of chronic disease 8. Urinary retention. Started Flomax.  Code Status:     Code Status Orders        Start     Ordered   01/28/17 1407  Limited resuscitation (code)  Continuous    Question Answer Comment  In the event of cardiac or respiratory ARREST: Initiate Code Blue, Call Rapid Response Yes   In the event of cardiac or respiratory ARREST: Perform CPR Yes   In the event of cardiac or respiratory ARREST: Perform Intubation/Mechanical Ventilation No   In the event of cardiac or respiratory ARREST: Use NIPPV/BiPAp only if indicated Yes   In the event of cardiac or respiratory ARREST: Administer ACLS medications if indicated Yes   In the event of cardiac or respiratory ARREST: Perform Defibrillation or Cardioversion if indicated Yes      01/28/17 1408    Code Status History    Date Active Date Inactive Code Status Order ID Comments User Context   01/24/2017  1:56 PM 01/25/2017  4:17 PM Full Code [id number]  Enedina Finner, MD Inpatient   01/15/2017 12:37 AM 01/16/2017  4:17 PM Full Code [id number]  Oralia Manis, MD Inpatient   01/07/2017  7:48 PM 01/09/2017  6:41 PM Full Code [id number]  Altamese Dilling, MD ED   09/05/2016  6:08 PM 09/07/2016  7:37 PM Full Code [id number]   Marguarite Arbour, MD Inpatient   08/14/2016 10:48 AM 08/19/2016  7:57 PM Full Code [id number]  Arnaldo Natal, MD Inpatient   08/06/2016 11:40 PM 08/07/2016  4:34 PM Full Code [id number]  Tonye Royalty, DO Inpatient   05/23/2015 10:14 PM 05/25/2015  2:59 PM Full Code [id number]  Altamese Dilling, MD Inpatient     Disposition Plan: Will need rehabilitation once breathing better.  Consultants:  Critical care specialist  Infectious disease  Antibiotics:  IV vancomycin  Time spent: 26 minutes  Alford Highland  [employer]

## 2017-02-08 NOTE — Progress Notes (Signed)
Peripherally Inserted Central Catheter/Midline Placement  The IV Nurse has discussed with the patient and/or persons authorized to consent for the patient, the purpose of this procedure and the potential benefits and risks involved with this procedure.  The benefits include less needle sticks, lab draws from the catheter, and the patient may be discharged home with the catheter. Risks include, but not limited to, infection, bleeding, blood clot (thrombus formation), and puncture of an artery; nerve damage and irregular heartbeat and possibility to perform a PICC exchange if needed/ordered by physician.  Alternatives to this procedure were also discussed.  Bard Power PICC patient education guide, fact sheet on infection prevention and patient information card has been provided to patient /or left at bedside.    PICC/Midline Placement Documentation  PICC Single Lumen 02/08/17 PICC Left Basilic 46 cm 0 cm (Active)  Indication for Insertion or Continuance of Line Prolonged intravenous therapies 02/08/2017  1:05 PM  Exposed Catheter (cm) 0 cm 02/08/2017  1:05 PM  Site Assessment Clean;Dry;Intact 02/08/2017  1:05 PM  Line Status Blood return noted;Flushed;Saline locked 02/08/2017  1:05 PM  Dressing Type Transparent 02/08/2017  1:05 PM  Dressing Status Clean;Dry;Intact;Antimicrobial disc in place 02/08/2017  1:05 PM  Dressing Intervention New dressing 02/08/2017  1:05 PM  Dressing Change Due 02/15/17 02/08/2017  1:05 PM       Netta Corriganhomas, Donalyn Schneeberger L 02/08/2017, 1:25 PM

## 2017-02-08 NOTE — Consult Note (Signed)
Consultation Note Date: 02/08/2017   Patient Name: Kristopher Powell  DOB: Nov 28, 1955  MRN: [id number]  Age / Sex: 61 y.o., male  PCP: Christen Bame, DO Referring Physician: Loletha Grayer, MD  Reason for Consultation: Establishing goals of care  HPI/Patient Profile: 61 y.o. male  with past medical history of COPD, tobacco use, chronic respiratory failure on 4 L oxygen, neuropathy, GERD, depression, current smoker admitted on 01/28/2017 with respiratory distress r/t COPD exacerbation with ongoing tobacco use and recurrent hyponatremia. Also with MRSA bacteremia and left lung pneumonia requiring 4 weeks of IV antibiotics.   Clinical Assessment and Goals of Care: I met today at Mr. Laabs bedside. No family present. We discussed the severity of his COPD and frequent admissions. He seems aware of the severity of his disease. I asked him how he felt he was doing at home and he tells me "not good" d/t extreme SOB but more so because he has not had electricity for months. I inquired if this affected his oxygen usage but he told me no he was still able to use his oxygen 24/7. He says his son is working on getting him electricity. CMRN/social work aware as well.   We discussed more his lack of progress with titrating down his oxygen requirements. He does say he feels like he is improving. We explored the thought that he may not improve and that even if he improves this admission there will come a time that his lungs will not respond to our therapies. He tells me that he is not scared to die but he believes he will improve this admission. Goal is still to keep trying and medically optimize him. He does say that when the time comes he is not responding he wants to discuss this more with his sons (4 sons) but right now remains hopeful. I also introduced the concept of hospice when he does not respond to our treatments -  but he quickly tells me that he would not want hospice. Could not discern his thoughts or past experience with hospice.   He confirms that he would not want intubation but would want CPR/shock - explained that this would not really make sense being that his lungs are his issue and if his lungs are not supported doing CPR/shock would not be beneficial to him (not sure he understood this concept). He wishes to remain DNI for now (unfortunately this is not an option outside the hospital).   Primary Decision Maker PATIENT    SUMMARY OF RECOMMENDATIONS   - Remains DNI - Continues to be hopeful for improvement - Difficult to engage but I will try again tomorrow  Code Status/Advance Care Planning:  Limited code - do not intubate   Symptom Management:   Per PCCM/attending.  Palliative Prophylaxis:   Aspiration, Delirium Protocol and Oral Care  Additional Recommendations (Limitations, Scope, Preferences):  Full Scope Treatment outside intubation  Psycho-social/Spiritual:   Desire for further Chaplaincy support:no  Additional Recommendations: Education on Hospice  Prognosis:   Unable to  determine although poor with frequent admissions for COPD and hyponatremia. Although prognosis could be prolonged with adequate support.   Discharge Planning: [place] for rehab with Palliative care service follow-up      Primary Diagnoses: Present on Admission: . COPD exacerbation ([place])   I have reviewed the medical record, interviewed the patient and family, and examined the patient. The following aspects are pertinent.  Past Medical History:  Diagnosis Date  . CHF (congestive heart failure) ([place])   . COPD (chronic obstructive pulmonary disease) ([place])   . Diabetes mellitus without complication ([place])   . Hypertension   . MI (myocardial infarction) ([place]) 2006   Social History   Social History  . Marital status: Single    Spouse name: N/A  . Number of children: N/A    . Years of education: N/A   Social History Main Topics  . Smoking status: Current Every Day Smoker    Packs/day: 1.00    Years: 40.00    Types: Cigarettes  . Smokeless tobacco: Never Used  . Alcohol use 7.2 - 14.4 oz/week    12 - 24 Cans of beer per week     Comment: occ  . Drug use: No  . Sexual activity: Not Asked   Other Topics Concern  . None   Social History Narrative  . None   Family History  Problem Relation Age of Onset  . CAD Mother        deceased 67  . Stroke Father        deceased 42  . Heart attack Brother        deceased 8   Scheduled Meds: . aspirin EC  325 mg Oral Daily  . budesonide (PULMICORT) nebulizer solution  0.5 mg Nebulization BID  . enoxaparin (LOVENOX) injection  40 mg Subcutaneous Q24H  . folic acid  1 mg Oral Daily  . gabapentin  1,200 mg Oral TID  . imipramine  200 mg Oral QHS  . insulin aspart  0-15 Units Subcutaneous TID WC  . insulin aspart  0-5 Units Subcutaneous QHS  . ipratropium-albuterol  3 mL Nebulization Q4H  . methylPREDNISolone (SOLU-MEDROL) injection  60 mg Intravenous Q12H  . metoprolol succinate  25 mg Oral Daily  . multivitamin with minerals  1 tablet Oral Daily  . nystatin  5 mL Oral QID  . pantoprazole  40 mg Oral Daily  . risperiDONE  4 mg Oral QHS  . sodium chloride flush  3 mL Intravenous Q12H  . tamsulosin  0.4 mg Oral Daily  . vitamin B-12  1,000 mcg Oral Daily   Continuous Infusions: . vancomycin Stopped (02/08/17 1055)   PRN Meds:.acetaminophen **OR** acetaminophen, benzonatate, diazepam, guaiFENesin-dextromethorphan, ipratropium-albuterol, meloxicam, ondansetron **OR** ondansetron (ZOFRAN) IV, polyethylene glycol, sodium chloride flush, sodium chloride flush No Known Allergies Review of Systems  Constitutional: Positive for activity change and appetite change.  Respiratory: Positive for shortness of breath.   Neurological: Positive for weakness.    Physical Exam  Constitutional: He is oriented to  person, place, and time. He appears well-developed.  HENT:  Head: Normocephalic and atraumatic.  Cardiovascular: Normal rate and regular rhythm.   Pulmonary/Chest: No accessory muscle usage. No tachypnea. He is in respiratory distress. He has decreased breath sounds. He has wheezes.  Abdominal: Soft. Normal appearance.  Neurological: He is alert and oriented to person, place, and time.  Vitals reviewed.   Vital Signs: BP (!) 141/75 (BP Location: Right Arm)   Pulse 99   Temp  98.3 F (36.8 C)   Resp 19   Ht 6' (1.829 m)   Wt 84.7 kg (186 lb 11.2 oz)   SpO2 94%   BMI 25.32 kg/m  Pain Assessment: No/denies pain POSS *See Group Information*: 2-Acceptable,Slightly drowsy, easily aroused Pain Score: 3    SpO2: SpO2: 94 % O2 Device:SpO2: 94 % O2 Flow Rate: .O2 Flow Rate (L/min): 45 L/min  IO: Intake/output summary:  Intake/Output Summary (Last 24 hours) at 02/08/17 1358 Last data filed at 02/08/17 1229  Gross per 24 hour  Intake              500 ml  Output             7050 ml  Net            -6550 ml    LBM: Last BM Date: 02/06/17 Baseline Weight: Weight: 77.1 kg (170 lb) Most recent weight: Weight: 84.7 kg (186 lb 11.2 oz)     Palliative Assessment/Data: 30%   Flowsheet Rows     Most Recent Value  Intake Tab  Referral Department  Hospitalist  Unit at Time of Referral  Cardiac/Telemetry Unit  Palliative Care Primary Diagnosis  Pulmonary  Date Notified  02/07/17  Palliative Care Type  New Palliative care  Reason for referral  Clarify Goals of Care  Date of Admission  01/28/17  # of days IP prior to Palliative referral  10  Clinical Assessment  Psychosocial & Spiritual Assessment  Palliative Care Outcomes      Time Total: 24mn  Greater than 50%  of this time was spent counseling and coordinating care related to the above assessment and plan.  Signed by: AVinie Sill NP Palliative Medicine Team Pager # [phone](M-F 8a-5p) Team Phone # [phone] (Nights/Weekends)

## 2017-02-09 DIAGNOSIS — Z7189 Other specified counseling: Secondary | ICD-10-CM

## 2017-02-09 DIAGNOSIS — Z515 Encounter for palliative care: Secondary | ICD-10-CM

## 2017-02-09 LAB — GLUCOSE, CAPILLARY
GLUCOSE-CAPILLARY: 187 mg/dL — AB (ref 65–99)
GLUCOSE-CAPILLARY: 205 mg/dL — AB (ref 65–99)
GLUCOSE-CAPILLARY: 118 mg/dL — AB (ref 65–99)
Glucose-Capillary: 124 mg/dL — ABNORMAL HIGH (ref 65–99)

## 2017-02-09 MED ORDER — PREDNISONE 20 MG PO TABS
40.0000 mg | ORAL_TABLET | Freq: Every day | ORAL | Status: DC
  Administered 2017-02-10: 40 mg via ORAL
  Filled 2017-02-09: qty 2

## 2017-02-09 MED ORDER — METHYLPREDNISOLONE SODIUM SUCC 40 MG IJ SOLR
40.0000 mg | Freq: Two times a day (BID) | INTRAMUSCULAR | Status: DC
  Filled 2017-02-09: qty 1

## 2017-02-09 NOTE — Clinical Social Work Note (Addendum)
CSW received phone call from [facility] who said they are able to accept patient once he is medically ready for discharge and orders have been received.  [facility] in [place] as previous SNF bed offer.  CSW attempted to contact [facility] to see what case worker is following patient, CSW left message awaiting for call back.  CSW to continue to follow patient's progress throughout discharge planning.  Ervin KnackEric R. Tijana Walder, MSW, Theresia MajorsLCSWA [phone]  02/09/2017 4:18 PM

## 2017-02-09 NOTE — Clinical Social Work Note (Signed)
CSW was informed that patient has a bed available at [facility] in [place], CSW to continue to follow patient's progress throughout discharge planning.  Ervin KnackEric R. Tahmir Kleckner, MSW, Theresia MajorsLCSWA [phone]  02/09/2017 4:16 PM

## 2017-02-09 NOTE — Progress Notes (Addendum)
Pharmacy Antibiotic Note  Kristopher Powell isEmeterio Powell a 61 y.o. male admitted on 01/28/2017 with pneumonia. Pharmacy has been consulted for vancomycin dosing for MRSA bacteremia and PNA with planned duration of abx x 4 weeks. Stop date 7/19 per ID.  Plan: Vancomycin level resulted @ 12 despite being drawn 6.5 hours post dose. Will increase Vancomycin dose to 1500 mg IV q8 hours and will recheck Vancomycin level 2330 on 6/27.   6/26 23:30 vanc level 19. Continue current regimen. Recheck as clinically indicated.  Height: 6' (182.9 cm) Weight: 185 lb 10 oz (84.2 kg) IBW/kg (Calculated) : 77.6  Temp (24hrs), Avg:98.2 F (36.8 C), Min:97.9 F (36.6 C), Max:98.3 F (36.8 C)   Recent Labs Lab 02/03/17 0317 02/03/17 0409  02/04/17 0453  02/05/17 0357  02/06/17 0441 02/07/17 1656 02/08/17 2309  WBC  --   --   --  22.2*  --  19.8*  --  16.6*  --   --   CREATININE  --  0.85  --  0.76  --  0.74  --  0.78  --   --   VANCOTROUGH  --   --   < >  --   < >  --   < >  --  12* 19  VANCORANDOM 19  --   --   --   --   --   --   --   --   --   < > = values in this interval not displayed.  Estimated Creatinine Clearance: 106.4 mL/min (by C-G formula based on SCr of 0.78 mg/dL).    No Known Allergies   Kristopher Powell, PharmD  Clinical Pharmacist  02/09/2017 4:41 AM

## 2017-02-09 NOTE — Progress Notes (Signed)
PT Cancellation Note  Patient Details Name: Emeterio ReeveHarmon L Muska MRN: [id number] DOB: 09/20/1955   Cancelled Treatment:    Reason Eval/Treat Not Completed: Other (comment) Pt refused this am stating, "not right now." SPT offered to help pt to the chair to rest, and pt cont to refuse. Will re-attempt in am, time permitting.   Latanya MaudlinLaura M Shital Crayton 02/09/2017, 11:23 AM

## 2017-02-09 NOTE — Plan of Care (Signed)
Problem: SLP Dysphagia Goals Goal: Misc Dysphagia Goal Pt will safely tolerate po diet of least restrictive consistency w/ no overt s/s of aspiration noted by Staff/pt/family x3 sessions.    

## 2017-02-09 NOTE — Progress Notes (Signed)
Speech Language Pathology Treatment: Dysphagia  Patient Details Name: Kristopher Powell L Silveri MRN: [id number] DOB: 02/16/1956 Today's Date: 02/09/2017 Time: 1500-1530 SLP Time Calculation (min) (ACUTE ONLY): 30 min  Assessment / Plan / Recommendation Clinical Impression  Pt seen for toleration of diet. Pt has had need for HFNC d/t Pulmonary status but is back on [place] and feels "better" regarding his breathing today. Verbally responsive but did require cues to sit more upright and follow general aspiration precautions as he preferred to recline more while eating a snack.  Pt appears at min increased risk for aspiration secondary to overall declined Pulmonary status. Pt is currently on 6 liters [place]. He exhibits min increased effort w/ exertion of oral intake but w/ rest breaks, he is able to to calm his breathing during po trials. Pt exhibited no immediate, overt s/s of aspiration w/ trials given; clear vocal quality b/t trials, no coughing. During the oral phase, pt appeared to adequately manage gumming/mashing the broken down solids (Edentulous status is his baseline).  Recommend continuing a Dysphagia level 2 (for easier oral phase management of foods and less exertion on his Pulmonary status) w/ Thin liquids; strict aspiration precautions including sitting upright for any oral intake and rest breaks; Pills in puree for safer swallowing at this time d/t pulmonary status. Recommend NO straws used; encouraged small, single sips slowly via Cup when drinking and Rest Breaks w/ ALL oral intake to reduce exertion during oral intake thus lessen risk for aspiration/choking. Pt gave verbal agreement to education given on recommendations but unsure of pt's full comprehension of precautions discussed; recommend f/u w/ family member. ST services will continue to follow pt's status while admitted for any change or decline necessitating an objective assessment of swallowing. NSG updated, agreed.    HPI HPI: Pt is a 61 y.o.  male with a known history of COPD, tobacco use, chronic respiratory failure on 4 L oxygen, neuropathy, GERD, DM, MI, depression presents to the emergency room due to worsening shortness of breath. His saturations were found to be 70% on room air. And 88% on 4 L oxygen. EMS gave him a dose of Solu-Medrol and nebulizer and brought to the emergency room. Here patient has received 2 additional nebulizer treatments. No significant improvement. Pt was admitted for acute COPD exacerbation with acute on chronic respiratory failure. He has had 6 admission in the last 6 months. Since this admission, a rapid response was called d/t altered mental status, decreased O2 sats and change in pulse. Pt was transferred to CCU yesterday. Now on BIPAP intermittently; 6 liters via [place]. CXR showed pneumonia left lung. Pt is alert and verbally responsive; follows general commands and answers basic questions re: self - appeared min distracted but attended w/ direction/cues from SLP or NSG. Pt has required HFNC d/t respiratory status during this admission but is now back on [place] and feeling more at his baseline w/ his breathing (per him).       SLP Plan  Continue with current plan of care       Recommendations  Diet recommendations: Dysphagia 2 (fine chop);Thin liquid (moistened foods) Liquids provided via: Cup (monitor any straw use) Medication Administration: Whole meds with puree Supervision: Patient able to self feed;Intermittent supervision to cue for compensatory strategies (setup assist) Compensations: Minimize environmental distractions;Slow rate;Small sips/bites;Lingual sweep for clearance of pocketing;Multiple dry swallows after each bite/sip;Follow solids with liquid (rest breaks to avoid SOB) Postural Changes and/or Swallow Maneuvers: Seated upright 90 degrees;Upright 30-60 min after meal (reflux precautions)  General recommendations:  (Dietician f/u) Oral Care Recommendations: Oral care  BID;Patient independent with oral care;Other (Comment) Follow up Recommendations: None (TBD) SLP Visit Diagnosis: Dysphagia, oropharyngeal phase (R13.12) Plan: Continue with current plan of care       GO                Jerilynn Som, MS, CCC-SLP Watson,Katherine 02/09/2017, 7:39 PM

## 2017-02-09 NOTE — Progress Notes (Signed)
   Name: Kristopher Powell MRN: [id number] DOB: 11/14/1955    ADMISSION DATE:  01/28/2017  CONSULTATION DATE: 6/19  REFERRING MD :  Dr. Anne HahnWillis  CHIEF COMPLAINT: Altered mental status, Hypoxia  BRIEF PATIENT DESCRIPTION: 61 year old male initially admitted with COPD.  Now transferred from the floor due to altered mental status and hypoxia  due to Pneumonia  SIGNIFICANT EVENTS  6/15 Patient admitted with COPD Exacerbation 6/19 Patient transferred from the floor due to altered , mental status and hypoxia due to PNA  STUDIES:  None  SUBJECTIVE:  Patient states he feels somewhat better today than yesterday, but still SOB +wheezing +cough + pleuritic chest pain with couhging On  [place] at 4.5L (yesterday on HFNC @ 65%)   VITAL SIGNS: Temp:  [97.9 F (36.6 C)-98.3 F (36.8 C)] 97.9 F (36.6 C) (06/27 0356) Pulse Rate:  [77-99] 91 (06/27 0853) Resp:  [18-19] 18 (06/27 0356) BP: (118-143)/(62-81) 139/81 (06/27 0853) SpO2:  [90 %-97 %] 90 % (06/27 0736) FiO2 (%):  [60 %-70 %] 60 % (06/26 1542) Weight:  [84.2 kg (185 lb 10 oz)] 84.2 kg (185 lb 10 oz) (06/27 0356)  PHYSICAL EXAMINATION: General: 61 y.o. caucasian male lying in bed, in no acute distress, complaints of SOB Neuro:   follows commands, alert and oriented HEENT:   AT,[name],No JVD, Cardiovascular:  RRR, s1s2, No M/R/G Lungs:  expiratory wheezes and rhonchi noted, symmetrical expansion Abdomen:  soft,NT,ND Musculoskeletal:  No edema,cyanosis  Skin:  Warm,dry and intact   Recent Labs Lab 02/04/17 0453 02/05/17 0357 02/06/17 0441  NA 133* 135 136  K 3.7 3.6 3.4*  CL 100* 102 101  CO2 26 27 29   BUN 18 18 13   CREATININE 0.76 0.74 0.78  GLUCOSE 84 114* 107*    Recent Labs Lab 02/04/17 0453 02/05/17 0357 02/06/17 0441  HGB 9.3* 9.0* 9.4*  HCT 27.3* 26.5* 27.7*  WBC 22.2* 19.8* 16.6*  PLT 237 247 259   No results found. ASSESSMENT / PLAN: Severe COPD with acute exacerbation from MRSA pneumonia- continues very  slow improvement   1.Continue  Solu-Medrol 40mg  q 12hrs 2.Continue dounebs every 4 hrs 3.pulmicort nebs 4.DNI 5.ABX as prescribed 6.PICC line for long term ABX therapy 7.oxygen down to 4.5L [place]    Reola Buckles Santiago Gladavid Kamica Florance, M.D.  [facility]  Medical Director [facility] Medical Director [facility] Cardio-Pulmonary Department

## 2017-02-09 NOTE — Progress Notes (Signed)
Patient ID: Kristopher Powell, male   DOB: [birth year], 61 y.o.   MRN: [id number]    [employer] PROGRESS NOTE  Kristopher Powell:[id number] DOB: [birth year] DOA: 01/28/2017 PCP: Candie Chroman, DO  HPI/Subjective: Patient more alert today. He is breathing better. Feels like his breathing is almost back to his usual.  Objective: Vitals:   02/09/17 0853 02/09/17 1142  BP: 139/81 124/70  Pulse: 91 83  Resp:  (!) 21  Temp:  97.9 F (36.6 C)    Filed Weights   02/07/17 0433 02/08/17 0445 02/09/17 0356  Weight: 82.2 kg (181 lb 3.2 oz) 84.7 kg (186 lb 11.2 oz) 84.2 kg (185 lb 10 oz)    ROS: Review of Systems  Constitutional: Negative for fever.  Respiratory: Positive for cough, shortness of breath and wheezing.   Cardiovascular: Negative for chest pain.  Gastrointestinal: Negative for abdominal pain, nausea and vomiting.  Genitourinary: Negative for dysuria.  Musculoskeletal: Negative for joint pain and myalgias.  Neurological: Negative for dizziness.   Exam: Physical Exam  Constitutional: He is oriented to person, place, and time.  HENT:  Nose: No mucosal edema.  Mouth/Throat: No oropharyngeal exudate or posterior oropharyngeal edema.  Eyes: Conjunctivae, EOM and lids are normal. Pupils are equal, round, and reactive to light.  Neck: No JVD present. Carotid bruit is not present. No edema present. No thyroid mass and no thyromegaly present.  Cardiovascular: S1 normal, S2 normal and normal heart sounds.  Tachycardia present.  Exam reveals no gallop.   No murmur heard. Pulses:      Dorsalis pedis pulses are 2+ on the right side, and 2+ on the left side.  Respiratory: No accessory muscle usage. No respiratory distress. He has decreased breath sounds in the right lower field, the left middle field and the left lower field. He has wheezes in the right lower field and the left lower field. He has no rhonchi. He has no rales.  GI: Soft. Bowel sounds are normal. There is no  tenderness.  Musculoskeletal:       Right ankle: He exhibits no swelling.       Left ankle: He exhibits no swelling.  Lymphadenopathy:    He has no cervical adenopathy.  Neurological: He is alert and oriented to person, place, and time. No cranial nerve deficit.  Skin: Skin is warm. No rash noted. Nails show no clubbing.  Psychiatric: He has a normal mood and affect.      Data Reviewed: Basic Metabolic Panel:  Recent Labs Lab 02/03/17 0409 02/03/17 1328 02/04/17 0453 02/05/17 0357 02/06/17 0441  NA  --   --  133* 135 136  K  --   --  3.7 3.6 3.4*  CL  --   --  100* 102 101  CO2  --   --  26 27 29   GLUCOSE  --   --  84 114* 107*  BUN  --   --  18 18 13   CREATININE 0.85  --  0.76 0.74 0.78  CALCIUM  --   --  7.6* 7.8* 7.5*  MG  --  1.8  --  2.2  --   PHOS  --   --   --  3.4  --    CBC:  Recent Labs Lab 02/04/17 0453 02/05/17 0357 02/06/17 0441  WBC 22.2* 19.8* 16.6*  HGB 9.3* 9.0* 9.4*  HCT 27.3* 26.5* 27.7*  MCV 81.6 82.1 82.0  PLT 237 247 259   BNP (last  3 results)  Recent Labs  01/07/17 1554 01/14/17 1901 01/24/17 1044  BNP 15.0 23.0 11.0    CBG:  Recent Labs Lab 02/08/17 1205 02/08/17 1645 02/08/17 2059 02/09/17 0730 02/09/17 1142  GLUCAP 217* 147* 152* 124* 187*    Recent Results (from the past 240 hour(s))  Culture, blood (Routine X 2) w Reflex to ID Panel     Status: None   Collection Time: 02/01/17  9:51 PM  Result Value Ref Range Status   Specimen Description BLOOD BLOOD RIGHT ARM  Final   Special Requests   Final    BOTTLES DRAWN AEROBIC AND ANAEROBIC Blood Culture results may not be optimal due to an excessive volume of blood received in culture bottles   Culture NO GROWTH 5 DAYS  Final   Report Status 02/06/2017 FINAL  Final  Culture, blood (Routine X 2) w Reflex to ID Panel     Status: Abnormal   Collection Time: 02/01/17  9:51 PM  Result Value Ref Range Status   Specimen Description BLOOD BLOOD LEFT HAND  Final   Special  Requests   Final    BOTTLES DRAWN AEROBIC AND ANAEROBIC Blood Culture adequate volume   Culture  Setup Time   Final    IN BOTH AEROBIC AND ANAEROBIC BOTTLES GRAM POSITIVE COCCI CRITICAL RESULT CALLED TO, READ BACK BY AND VERIFIED WITH: DAVID BESANTI AT 2132 ON 02/02/17 BY SNJ Performed at [facility], [address], [place], [place] [address]    Culture METHICILLIN RESISTANT STAPHYLOCOCCUS AUREUS (A)  Final   Report Status 02/05/2017 FINAL  Final   Organism ID, Bacteria METHICILLIN RESISTANT STAPHYLOCOCCUS AUREUS  Final      Susceptibility   Methicillin resistant staphylococcus aureus - MIC*    CIPROFLOXACIN >=8 RESISTANT Resistant     ERYTHROMYCIN >=8 RESISTANT Resistant     GENTAMICIN <=0.5 SENSITIVE Sensitive     OXACILLIN >=4 RESISTANT Resistant     TETRACYCLINE <=1 SENSITIVE Sensitive     VANCOMYCIN <=0.5 SENSITIVE Sensitive     TRIMETH/SULFA <=10 SENSITIVE Sensitive     CLINDAMYCIN <=0.25 SENSITIVE Sensitive     RIFAMPIN <=0.5 SENSITIVE Sensitive     Inducible Clindamycin NEGATIVE Sensitive     * METHICILLIN RESISTANT STAPHYLOCOCCUS AUREUS  Blood Culture ID Panel (Reflexed)     Status: Abnormal   Collection Time: 02/01/17  9:51 PM  Result Value Ref Range Status   Enterococcus species NOT DETECTED NOT DETECTED Final   Listeria monocytogenes NOT DETECTED NOT DETECTED Final   Staphylococcus species DETECTED (A) NOT DETECTED Final    Comment: CRITICAL RESULT CALLED TO, READ BACK BY AND VERIFIED WITH: DAVID BESANTI AT 2132 ON 02/02/17 BY SNJ    Staphylococcus aureus DETECTED (A) NOT DETECTED Final    Comment: Methicillin (oxacillin)-resistant Staphylococcus aureus (MRSA). MRSA is predictably resistant to beta-lactam antibiotics (except ceftaroline). Preferred therapy is vancomycin unless clinically contraindicated. Patient requires contact precautions if  hospitalized. CRITICAL RESULT CALLED TO, READ BACK BY AND VERIFIED WITH: DAVID BESANTI AT 2132 ON 02/02/17 BY SNJ     Methicillin resistance DETECTED (A) NOT DETECTED Final    Comment: CRITICAL RESULT CALLED TO, READ BACK BY AND VERIFIED WITH: DAVID BESANTI AT 2132 ON 02/02/17 BY SNJ    Streptococcus species NOT DETECTED NOT DETECTED Final   Streptococcus agalactiae NOT DETECTED NOT DETECTED Final   Streptococcus pneumoniae NOT DETECTED NOT DETECTED Final   Streptococcus pyogenes NOT DETECTED NOT DETECTED Final   Acinetobacter baumannii NOT DETECTED NOT  DETECTED Final   Enterobacteriaceae species NOT DETECTED NOT DETECTED Final   Enterobacter cloacae complex NOT DETECTED NOT DETECTED Final   Escherichia coli NOT DETECTED NOT DETECTED Final   Klebsiella oxytoca NOT DETECTED NOT DETECTED Final   Klebsiella pneumoniae NOT DETECTED NOT DETECTED Final   Proteus species NOT DETECTED NOT DETECTED Final   Serratia marcescens NOT DETECTED NOT DETECTED Final   Haemophilus influenzae NOT DETECTED NOT DETECTED Final   Neisseria meningitidis NOT DETECTED NOT DETECTED Final   Pseudomonas aeruginosa NOT DETECTED NOT DETECTED Final   Candida albicans NOT DETECTED NOT DETECTED Final   Candida glabrata NOT DETECTED NOT DETECTED Final   Candida krusei NOT DETECTED NOT DETECTED Final   Candida parapsilosis NOT DETECTED NOT DETECTED Final   Candida tropicalis NOT DETECTED NOT DETECTED Final  MRSA PCR Screening     Status: Abnormal   Collection Time: 02/01/17 11:28 PM  Result Value Ref Range Status   MRSA by PCR POSITIVE (A) NEGATIVE Final    Comment:        The GeneXpert MRSA Assay (FDA approved for NASAL specimens only), is one component of a comprehensive MRSA colonization surveillance program. It is not intended to diagnose MRSA infection nor to guide or monitor treatment for MRSA infections. RESULT CALLED TO, READ BACK BY AND VERIFIED WITH: TESS THOMAS @ 0112 ON 02/02/2017 BY CAF   Culture, expectorated sputum-assessment     Status: None   Collection Time: 02/02/17  2:27 AM  Result Value Ref Range Status    Specimen Description SPUTUM  Final   Special Requests NONE  Final   Sputum evaluation THIS SPECIMEN IS ACCEPTABLE FOR SPUTUM CULTURE  Final   Report Status 02/02/2017 FINAL  Final  Culture, respiratory (NON-Expectorated)     Status: None   Collection Time: 02/02/17  2:27 AM  Result Value Ref Range Status   Specimen Description SPUTUM  Final   Special Requests NONE Reflexed from [id number]  Final   Gram Stain   Final    ABUNDANT WBC PRESENT,BOTH PMN AND MONONUCLEAR ABUNDANT GRAM POSITIVE COCCI IN PAIRS IN CLUSTERS MODERATE GRAM VARIABLE ROD    Culture   Final    ABUNDANT METHICILLIN RESISTANT STAPHYLOCOCCUS AUREUS   Report Status 02/04/2017 FINAL  Final   Organism ID, Bacteria METHICILLIN RESISTANT STAPHYLOCOCCUS AUREUS  Final      Susceptibility   Methicillin resistant staphylococcus aureus - MIC*    CIPROFLOXACIN >=8 RESISTANT Resistant     ERYTHROMYCIN >=8 RESISTANT Resistant     GENTAMICIN <=0.5 SENSITIVE Sensitive     OXACILLIN >=4 RESISTANT Resistant     TETRACYCLINE <=1 SENSITIVE Sensitive     VANCOMYCIN 1 SENSITIVE Sensitive     TRIMETH/SULFA <=10 SENSITIVE Sensitive     CLINDAMYCIN <=0.25 SENSITIVE Sensitive     RIFAMPIN <=0.5 SENSITIVE Sensitive     Inducible Clindamycin NEGATIVE Sensitive     * ABUNDANT METHICILLIN RESISTANT STAPHYLOCOCCUS AUREUS  C difficile quick scan w PCR reflex     Status: None   Collection Time: 02/02/17  9:10 PM  Result Value Ref Range Status   C Diff antigen NEGATIVE NEGATIVE Final   C Diff toxin NEGATIVE NEGATIVE Final   C Diff interpretation No C. difficile detected.  Final  Culture, blood (Routine X 2) w Reflex to ID Panel     Status: None   Collection Time: 02/03/17  1:28 PM  Result Value Ref Range Status   Specimen Description BLOOD RIGHT HAND  Final   Special  Requests   Final    BOTTLES DRAWN AEROBIC AND ANAEROBIC Blood Culture results may not be optimal due to an inadequate volume of blood received in culture bottles   Culture NO GROWTH  5 DAYS  Final   Report Status 02/08/2017 FINAL  Final  Culture, blood (Routine X 2) w Reflex to ID Panel     Status: None   Collection Time: 02/03/17  1:36 PM  Result Value Ref Range Status   Specimen Description BLOOD RIGHT AC  Final   Special Requests   Final    BOTTLES DRAWN AEROBIC AND ANAEROBIC Blood Culture adequate volume   Culture NO GROWTH 5 DAYS  Final   Report Status 02/08/2017 FINAL  Final      Scheduled Meds: . aspirin EC  325 mg Oral Daily  . budesonide (PULMICORT) nebulizer solution  0.5 mg Nebulization BID  . enoxaparin (LOVENOX) injection  40 mg Subcutaneous Q24H  . folic acid  1 mg Oral Daily  . gabapentin  1,200 mg Oral TID  . imipramine  200 mg Oral QHS  . insulin aspart  0-15 Units Subcutaneous TID WC  . insulin aspart  0-5 Units Subcutaneous QHS  . ipratropium-albuterol  3 mL Nebulization Q4H  . methylPREDNISolone (SOLU-MEDROL) injection  40 mg Intravenous Q12H  . metoprolol succinate  25 mg Oral Daily  . multivitamin with minerals  1 tablet Oral Daily  . nystatin  5 mL Oral QID  . pantoprazole  40 mg Oral Daily  . risperiDONE  4 mg Oral QHS  . sodium chloride flush  3 mL Intravenous Q12H  . tamsulosin  0.4 mg Oral Daily  . vitamin B-12  1,000 mcg Oral Daily   Continuous Infusions: . vancomycin Stopped (02/09/17 1045)    Assessment/Plan:  1. Acute on chronic respiratory failure.  Tapered off high flow nasal cannula yesterday evening. Patient tapered down to his baseline 4.5 L of oxygen during the day. Potentially out to rehabilitation tomorrow 2. MRSA sepsis with left-sided pneumonia. On IV vancomycin. Patient will need 4 weeks of IV antibiotics through July 19. Placed PICC line.  Repeat blood cultures are negative 3. COPD exacerbation. Taper Solu-Medrol to 40 mg IV every 12 hours today and to by mouth prednisone tomorrow. DuoNeb and budesonide nebulizers.  4. Chronic mild hyponatremia secondary to over drinking water. Sodium normal  range. 5. Steroid-induced hyperglycemia.  Patient on sliding scale. 6. Weakness physical therapy evaluation appreciated 7. Anemia of chronic disease 8. Urinary retention. Started Flomax.  Code Status:     Code Status Orders        Start     Ordered   01/28/17 1407  Limited resuscitation (code)  Continuous    Question Answer Comment  In the event of cardiac or respiratory ARREST: Initiate Code Blue, Call Rapid Response Yes   In the event of cardiac or respiratory ARREST: Perform CPR Yes   In the event of cardiac or respiratory ARREST: Perform Intubation/Mechanical Ventilation No   In the event of cardiac or respiratory ARREST: Use NIPPV/BiPAp only if indicated Yes   In the event of cardiac or respiratory ARREST: Administer ACLS medications if indicated Yes   In the event of cardiac or respiratory ARREST: Perform Defibrillation or Cardioversion if indicated Yes      01/28/17 1408    Code Status History    Date Active Date Inactive Code Status Order ID Comments User Context   01/24/2017  1:56 PM 01/25/2017  4:17 PM Full Code [id number]  Enedina Finner, MD Inpatient   01/15/2017 12:37 AM 01/16/2017  4:17 PM Full Code [id number]  Oralia Manis, MD Inpatient   01/07/2017  7:48 PM 01/09/2017  6:41 PM Full Code [id number]  Altamese Dilling, MD ED   09/05/2016  6:08 PM 09/07/2016  7:37 PM Full Code [id number]  Marguarite Arbour, MD Inpatient   08/14/2016 10:48 AM 08/19/2016  7:57 PM Full Code [id number]  Arnaldo Natal, MD Inpatient   08/06/2016 11:40 PM 08/07/2016  4:34 PM Full Code [id number]  Tonye Royalty, DO Inpatient   05/23/2015 10:14 PM 05/25/2015  2:59 PM Full Code [id number]  Altamese Dilling, MD Inpatient     Disposition Plan: Potential disposition tomorrow to rehabilitation  Consultants:  Critical care specialist  Infectious disease  Antibiotics:  IV vancomycin  Time spent: 24 minutes  Alford Highland  [employer]

## 2017-02-10 ENCOUNTER — Inpatient Hospital Stay: Payer: Medicaid Other

## 2017-02-10 DIAGNOSIS — R0602 Shortness of breath: Secondary | ICD-10-CM

## 2017-02-10 LAB — BUN: BUN: 17 mg/dL (ref 6–20)

## 2017-02-10 LAB — GLUCOSE, CAPILLARY
GLUCOSE-CAPILLARY: 306 mg/dL — AB (ref 65–99)
GLUCOSE-CAPILLARY: 256 mg/dL — AB (ref 65–99)
Glucose-Capillary: 172 mg/dL — ABNORMAL HIGH (ref 65–99)
Glucose-Capillary: 198 mg/dL — ABNORMAL HIGH (ref 65–99)

## 2017-02-10 LAB — CREATININE, SERUM
CREATININE: 0.65 mg/dL (ref 0.61–1.24)
GFR calc Af Amer: >60 mL/min (ref >60)
GFR calc non Af Amer: >60 mL/min (ref >60)

## 2017-02-10 LAB — VANCOMYCIN, TROUGH: Vancomycin Tr: 21 ug/mL (ref 15–20)

## 2017-02-10 MED ORDER — METHYLPREDNISOLONE SODIUM SUCC 125 MG IJ SOLR
60.0000 mg | Freq: Every day | INTRAMUSCULAR | Status: DC
  Administered 2017-02-10: 60 mg via INTRAVENOUS
  Filled 2017-02-10: qty 2

## 2017-02-10 MED ORDER — METHYLPREDNISOLONE SODIUM SUCC 125 MG IJ SOLR
60.0000 mg | Freq: Four times a day (QID) | INTRAMUSCULAR | Status: DC
  Administered 2017-02-10 – 2017-02-11 (×4): 60 mg via INTRAVENOUS
  Filled 2017-02-10 (×4): qty 2

## 2017-02-10 MED ORDER — VANCOMYCIN HCL 10 G IV SOLR
1250.0000 mg | Freq: Three times a day (TID) | INTRAVENOUS | Status: DC
  Administered 2017-02-10 – 2017-02-11 (×3): 1250 mg via INTRAVENOUS
  Filled 2017-02-10 (×5): qty 1250

## 2017-02-10 NOTE — Clinical Social Work Note (Signed)
CSW received phone call back from patient's APS worker Kristopher Powell.  She said she spoke to patient's son and he is working on finding an apartment that both of them are able to live in.  CSW was also informed that APS is no longer persuing guardianship for patient.  CSW updated APS worker that patient has a bed available for him at [facility] once he is medically ready for discharge and orders have been received.  Kristopher Powell, MSW, Theresia MajorsLCSWA [phone]  02/10/2017 10:01 AM

## 2017-02-10 NOTE — Progress Notes (Signed)
Nutrition Follow-up  DOCUMENTATION CODES:   Severe malnutrition in context of social or environmental circumstances  INTERVENTION:  1. Remains on NDD2 - thin per SLP  NUTRITION DIAGNOSIS:   Malnutrition (Severe) related to social / environmental circumstances (limited access to food) as evidenced by percent weight loss, severe depletion of body fat, moderate depletions of muscle mass, severe depletion of muscle mass. -ongoing  GOAL:   Patient will meet greater than or equal to 90% of their needs -not meeting  MONITOR:   PO intake, Labs  REASON FOR ASSESSMENT:   Malnutrition Screening Tool    ASSESSMENT:   61 year old male with PMHx of COPD, DM, CHF, HTN, hx of MI in 2006 admitted with acute exacerbation of COPD, chronic hyponatremia due to pulmonary issues on 2 L fluid restriction.  Transferred to CCU with AMS, Hypoxia on 6L [place] Duonebs/pulmicort nebs Currently DNI Access: L PICC Monitor PO intake Labs and medications reviewed.  Diet Order:  DIET DYS 2 Room service appropriate? Yes with Assist; Fluid consistency: Thin; Fluid restriction: 2000 mL Fluid  Skin:  Wound (see comment) (cellulitis left leg)  Last BM:  01/28/2017  Height:   Ht Readings from Last 1 Encounters:  01/28/17 6' (1.829 m)    Weight:   Wt Readings from Last 1 Encounters:  02/10/17 184 lb 14.4 oz (83.9 kg)    Ideal Body Weight:  80.9 kg  BMI:  Body mass index is 25.08 kg/m.  Estimated Nutritional Needs:   Kcal:  [id number] (MSJ x 1.3-1.4)  Protein:  100-115 grams (1.3-1.5 grams/kg)  Fluid:  2 L/day fluid restriction per MD  EDUCATION NEEDS:   Education needs addressed  Dionne AnoWilliam M. Kally Cadden, MS, RD LDN Inpatient Clinical Dietitian Pager [phone]

## 2017-02-10 NOTE — Progress Notes (Signed)
Daily Progress Note   Patient Name: Kristopher ReeveHarmon L Powell       Date: 02/10/2017 DOB: 05/27/1956  Age: 61 y.o. MRN#: [id number] Attending Physician: Adrian SaranMody, Sital, MD Primary Care Physician: Candie ChromanBarzin, Amir Homayoun, DO Admit Date: 01/28/2017  Reason for Consultation/Follow-up: Establishing goals of care  Subjective: Kristopher Powell is very SOB this morning, feels worse.   Length of Stay: 13  Current Medications: Scheduled Meds:  . aspirin EC  325 mg Oral Daily  . budesonide (PULMICORT) nebulizer solution  0.5 mg Nebulization BID  . enoxaparin (LOVENOX) injection  40 mg Subcutaneous Q24H  . folic acid  1 mg Oral Daily  . gabapentin  1,200 mg Oral TID  . imipramine  200 mg Oral QHS  . insulin aspart  0-15 Units Subcutaneous TID WC  . insulin aspart  0-5 Units Subcutaneous QHS  . ipratropium-albuterol  3 mL Nebulization Q4H  . methylPREDNISolone (SOLU-MEDROL) injection  60 mg Intravenous Q6H  . metoprolol succinate  25 mg Oral Daily  . multivitamin with minerals  1 tablet Oral Daily  . nystatin  5 mL Oral QID  . pantoprazole  40 mg Oral Daily  . risperiDONE  4 mg Oral QHS  . sodium chloride flush  3 mL Intravenous Q12H  . tamsulosin  0.4 mg Oral Daily  . vitamin B-12  1,000 mcg Oral Daily    Continuous Infusions: . vancomycin Stopped (02/10/17 1104)    PRN Meds: acetaminophen **OR** acetaminophen, benzonatate, diazepam, guaiFENesin-dextromethorphan, ipratropium-albuterol, meloxicam, ondansetron **OR** ondansetron (ZOFRAN) IV, polyethylene glycol, sodium chloride flush, sodium chloride flush  Physical Exam         Constitutional: He is oriented to person, place, and time. He appears well-developed.  HENT:  Head: Normocephalic and atraumatic.  Cardiovascular: Normal rate and regular rhythm.    Pulmonary/Chest: No accessory muscle usage. No tachypnea. He is in mod-severe respiratory distress. He has decreased breath sounds. He has wheezes.  Abdominal: Soft. Normal appearance.  Neurological: He is alert and oriented to person, place, and time.  Vitals reviewed.   Vital Signs: BP (!) 101/55 (BP Location: Right Arm)   Pulse 91   Temp 98.2 F (36.8 C) (Oral)   Resp (!) 21   Ht 6' (1.829 m)   Wt 83.9 kg (184 lb 14.4 oz)   SpO2 92%  BMI 25.08 kg/m  SpO2: SpO2: 92 % O2 Device: O2 Device: Nasal Cannula O2 Flow Rate: O2 Flow Rate (L/min): 6 L/min  Intake/output summary:  Intake/Output Summary (Last 24 hours) at 02/10/17 1405 Last data filed at 02/10/17 1354  Gross per 24 hour  Intake             3860 ml  Output             3800 ml  Net               60 ml   LBM: Last BM Date: 02/08/17 Baseline Weight: Weight: 77.1 kg (170 lb) Most recent weight: Weight: 83.9 kg (184 lb 14.4 oz)       Palliative Assessment/Data:    Flowsheet Rows     Most Recent Value  Intake Tab  Referral Department  Hospitalist  Unit at Time of Referral  Cardiac/Telemetry Unit  Palliative Care Primary Diagnosis  Pulmonary  Date Notified  02/07/17  Palliative Care Type  New Palliative care  Reason for referral  Clarify Goals of Care  Date of Admission  01/28/17  # of days IP prior to Palliative referral  10  Clinical Assessment  Psychosocial & Spiritual Assessment  Palliative Care Outcomes      Patient Active Problem List   Diagnosis Date Noted  . DNR (do not resuscitate) discussion   . Goals of care, counseling/discussion   . Palliative care encounter   . Weakness   . COPD exacerbation (HCC) 01/28/2017  . 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 Tardive dyskinesia 01/24/2017  . SIRS (systemic inflammatory response syndrome) (HCC) 09/05/2016  . Acute URI 09/05/2016  . Acute respiratory failure (HCC) 09/05/2016  . Acute on chronic respiratory failure with hypoxia and hypercapnia (HCC)  08/14/2016  . Chest pain 08/07/2016  . Dehydration with hyponatremia 08/06/2016  . Chest pain, rule out acute myocardial infarction 08/06/2016  . CHF (congestive heart failure) (HCC)   . Generalized anxiety disorder 08/14/2015  . Benzodiazepine abuse 08/14/2015  . Acute delirium 08/14/2015  . Diabetes (HCC) 08/14/2015  . COPD (chronic obstructive pulmonary disease) (HCC) 08/14/2015  . Suicidal ideation 08/14/2015  . Hyponatremia 05/23/2015  . Hypokalemia 05/23/2015    Palliative Care Assessment & Plan   HPI: 61 y.o. male  with past medical history of COPD, tobacco use, chronic respiratory failure on 4 L oxygen, neuropathy, GERD, depression, current smoker admitted on 01/28/2017 with respiratory distress r/t COPD exacerbation with ongoing tobacco use and recurrent hyponatremia. Also with MRSA bacteremia and left lung pneumonia requiring 4 weeks of IV antibiotics.    Assessment: Kristopher Powell is dyspneic and much more SOB today than when I saw him on Tuesday (he was on ~65% HFNC then). He is now on 6L nasal cannula but not tolerating very well. He tells me that he feels worse and is exhausted, not sleeping well. Difficult for him to speak to me at this time. Emotional support provided. Encouraged him to rest and when eating or with any activity to take his time and allow rest periods.   I came back later to check on Kristopher Powell to see if he was able to talk to me a little better but he is asleep. I will follow up again tomorrow.   Recommendations/Plan:  Per PCCM/attending.  Consider Morphine 2 mg PO every 4 hours for relief of dyspnea.   Goals of Care and Additional Recommendations:  Limitations on Scope of Treatment: Full Scope Treatment outside DNI  Code Status:  Limited code DNI  Prognosis:   Difficult to say but appears very poor given slow progression with continued severe symptom burden. Would be eligible for hospice if goals aligned.   Discharge Planning:  Skilled Nursing  Facility for rehab with Palliative care service follow-up is the goal   Thank you for allowing the Palliative Medicine Team to assist in the care of this patient.   Total Time Prolonged Time Billed  no       Greater than 50%  of this time was spent counseling and coordinating care related to the above assessment and plan.  Yong Channel, NP Palliative Medicine Team Pager # [phone] (M-F 8a-5p) Team Phone # [phone] (Nights/Weekends)

## 2017-02-10 NOTE — Progress Notes (Signed)
Pharmacy Antibiotic Note  Kristopher Powell is a 61 y.o. male admitted on 01/28/2017 with pneumonia. Pharmacy has been consulted for vancomycin dosing for MRSA bacteremia and PNA with planned duration of abx x 4 weeks. Stop date 7/19 per ID.  Plan: Vancomycin level resulted @ 21. Will therefore decrease Vancomycin dose slightly to 1250 mg IV q8 hours. Will then order Vancomycin levels as clinically indicated.  Pharmacy to follow.   Height: 6' (182.9 cm) Weight: 184 lb 14.4 oz (83.9 kg) IBW/kg (Calculated) : 77.6  Temp (24hrs), Avg:98.1 F (36.7 C), Min:97.7 F (36.5 C), Max:98.3 F (36.8 C)   Recent Labs Lab 02/04/17 0453  02/05/17 0357  02/06/17 0441  02/08/17 2309 02/10/17 0454 02/10/17 1515  WBC 22.2*  --  19.8*  --  16.6*  --   --   --   --   CREATININE 0.76  --  0.74  --  0.78  --   --  0.65  --   VANCOTROUGH  --   < >  --   < >  --   < > 19  --  21*  < > = values in this interval not displayed.  Estimated Creatinine Clearance: 106.4 mL/min (by C-G formula based on SCr of 0.65 mg/dL).    No Known Allergies   Demetrius Charityeldrin D. Fabricio Endsley, PharmD  Clinical Pharmacist  02/10/2017 4:42 PM

## 2017-02-10 NOTE — Progress Notes (Signed)
PT Cancellation Note  Patient Details Name: Kristopher Powell MRN: [id number] DOB: 12/25/1955   Cancelled Treatment:    Reason Eval/Treat Not Completed: Other (comment) . Pt refused this am. Upon arrival , pt highly lethargic and struggling to open eyes with tactile cuing. Pt mumbled incoherently several times. With multiple attempts pt stated, "not right now." Will re-attempt, time permitting.    Latanya MaudlinLaura M Ylonda Storr 02/10/2017, 11:50 AM

## 2017-02-10 NOTE — Progress Notes (Signed)
Name: Kristopher ReeveHarmon L Powell MRN: [id number] DOB: 08/10/1956    ADMISSION DATE:  01/28/2017  CONSULTATION DATE: 6/19  REFERRING MD :  Dr. Anne HahnWillis  CHIEF COMPLAINT: Altered mental status, Hypoxia  BRIEF PATIENT DESCRIPTION: 61 year old male initially admitted with COPD.  Now transferred from the floor due to altered mental status and hypoxia  due to Pneumonia  SIGNIFICANT EVENTS  6/15 Patient admitted with COPD Exacerbation 6/19 Patient transferred from the floor due to altered , mental status and hypoxia due to PNA  STUDIES:  None  SUBJECTIVE:  Patient states he remains Short of Breath, maybe slightly worse than yesterday, +wheezing, +cough with yellow phlegm producution, + pleuritic chest pain with couhging On  [place] at 6L currently (yesterday on [place] @ 4L)  REVIEW OF SYSTEMS: Positives in BOLD Gen: Denies fever, chills, weight change, fatigue, night sweats HEENT: Denies blurred vision, double vision, hearing loss, tinnitus, sinus congestion, rhinorrhea, sore throat, neck stiffness, dysphagia PULM: Denies +shortness of breath, +cough, +sputum production, hemoptysis, +wheezing, + Pleuritic chest pain with coughing CV: Denies chest pain, edema, orthopnea, paroxysmal nocturnal dyspnea, palpitations GI: Denies abdominal pain, nausea, vomiting, diarrhea, hematochezia, melena, constipation, change in bowel habits GU: Denies dysuria, hematuria, polyuria, oliguria, urethral discharge Endocrine: Denies hot or cold intolerance, polyuria, polyphagia or appetite change Derm: Denies rash, dry skin, scaling or peeling skin change Heme: Denies easy bruising, bleeding, bleeding gums Neuro: Denies headache, numbness, weakness, slurred speech, loss of memory or consciousness   VITAL SIGNS: Temp:  [97.7 F (36.5 C)-98.3 F (36.8 C)] 98.2 F (36.8 C) (06/28 0825) Pulse Rate:  [83-102] 102 (06/28 0825) Resp:  [18-21] 18 (06/28 0825) BP: (93-132)/(53-71) 93/53 (06/28 0825) SpO2:  [88 %-93 %] 88 %  (06/28 0825) Weight:  [83.9 kg (184 lb 14.4 oz)] 83.9 kg (184 lb 14.4 oz) (06/28 0426)  PHYSICAL EXAMINATION: General: 61 y.o. caucasian male lying in bed, in no acute distress, coughing, SOB Neuro:   follows commands, alert and oriented HEENT:   AT,,No JVD, Cardiovascular:  RRR, s1s2, No M/R/G Lungs:  Expiratory wheezes noted throughout, symmetrical expansion, tachypnea, mild assessory muscle use Abdomen:  Soft,Non-tender, Non-distended, BS + all 4 quadrants Musculoskeletal:  No edema,cyanosis  Skin:  Warm,dry and intact   Recent Labs Lab 02/04/17 0453 02/05/17 0357 02/06/17 0441 02/10/17 0454  NA 133* 135 136  --   K 3.7 3.6 3.4*  --   CL 100* 102 101  --   CO2 26 27 29   --   BUN 18 18 13 17   CREATININE 0.76 0.74 0.78 0.65  GLUCOSE 84 114* 107*  --     Recent Labs Lab 02/04/17 0453 02/05/17 0357 02/06/17 0441  HGB 9.3* 9.0* 9.4*  HCT 27.3* 26.5* 27.7*  WBC 22.2* 19.8* 16.6*  PLT 237 247 259   Dg Chest 1 View  Result Date: 02/10/2017 CLINICAL DATA:  Shortness of breath. EXAM: CHEST 1 VIEW COMPARISON:  04/07/2017 .  01/20/2017 FINDINGS: Interim placement of left PICC line, its tip is overlying the spine and not well visualized. Tip appears to be at the cavoatrial junction . Stable cardiomegaly Diffuse bilateral pulmonary infiltrates with dense infiltrate and consolidation left upper lobe. No signet change from prior exam. No pleural effusion or pneumothorax . IMPRESSION: 1. Interim placement of left PICC line. Its tip is overlying the spine and difficult to visualize, tip appears to be at the cavoatrial junction. 2. Diffuse bilateral pulmonary infiltrates with particularly dense infiltrate and consolidation left upper lobe. No  significant change prior exam. Findings most consistent with bilateral pneumonia. Electronically Signed   By: Maisie Fus  Register   On: 02/10/2017 09:22  I have Independently reviewed images of  CXR   on 02/10/2017 Interpretation:slight worsening of  left sided opacification    ASSESSMENT / PLAN: Severe COPD with acute exacerbation from MRSA pneumonia- very slow improvement with continued wheezing and SOB   1.Advance  Solu-Medrol IV to 60 mg q6hrs  2.Continue dounebs every 4 hrs 3. Continue pulmicort nebs 4.DNI 5.ABX as prescribed 6.PICC line for long term ABX therapy placed yesterday 6/27 7.Oxygen requirements up to 6L [place] 8.will obtain CT chest      Patient  satisfied with Plan of action and management. All questions answered  Lucie Leather, M.D.  [facility]  Medical Director [facility] Transplant Medical Director [facility] Cardio-Pulmonary Department

## 2017-02-10 NOTE — Progress Notes (Signed)
Patient presently lying in the bed, alert and oriented, rounded with MD at bedside, PT unable to work with PT at this time, will reassess later today. Continue on IV vanc and Neb tx

## 2017-02-10 NOTE — Progress Notes (Signed)
[facility] [place] [facility]   PATIENT NAME: Kristopher SawyersHarmon Quashie    MR#:  [id number]  DATE OF BIRTH:  04/03/1956  SUBJECTIVE:   Patient still with shortness of breath and cough  REVIEW OF SYSTEMS:    Review of Systems  Constitutional: Negative for fever, chills weight loss HENT: Negative for ear pain, nosebleeds, congestion, facial swelling, rhinorrhea, neck pain, neck stiffness and ear discharge.   Respiratory: Positive cough, shortness of breath and wheezing  Cardiovascular: Negative for chest pain, palpitations and leg swelling.  Gastrointestinal: Negative for heartburn, abdominal pain, vomiting, diarrhea or consitpation Genitourinary: Negative for dysuria, urgency, frequency, hematuria Musculoskeletal: Negative for back pain or joint pain Neurological: Negative for dizziness, seizures, syncope, focal weakness,  numbness and headaches.  Hematological: Does not bruise/bleed easily.  Psychiatric/Behavioral: Negative for hallucinations, confusion, dysphoric mood    Tolerating Diet: yes      DRUG ALLERGIES:  No Known Allergies  VITALS:  Blood pressure (!) 93/53, pulse (!) 102, temperature 98.2 F (36.8 C), temperature source Oral, resp. rate 18, height 6' (1.829 m), weight 83.9 kg (184 lb 14.4 oz), SpO2 (!) 88 %.  PHYSICAL EXAMINATION:  Constitutional: Appears well-developed and well-nourished. No distress. HENT: Normocephalic. Marland Kitchen. Oropharynx is clear and moist.  Eyes: Conjunctivae and EOM are normal. PERRLA, no scleral icterus.  Neck: Normal ROM. Neck supple. No JVD. No tracheal deviation. CVS: RRR, S1/S2 +, no murmurs, no gallops, no carotid bruit.  Pulmonary: Effort and breath Bilateral rhonchi/wheezing right base Abdominal: Soft. BS +,  no distension, tenderness, rebound or guarding.  Musculoskeletal: Normal range of motion. No edema and no tenderness.  Neuro: Alert. CN 2-12 grossly intact. No focal deficits. Skin: Skin is warm and dry. No rash  noted. Psychiatric: Normal mood and affect.      LABORATORY PANEL:   CBC  Recent Labs Lab 02/06/17 0441  WBC 16.6*  HGB 9.4*  HCT 27.7*  PLT 259   ------------------------------------------------------------------------------------------------------------------  Chemistries   Recent Labs Lab 02/05/17 0357 02/06/17 0441 02/10/17 0454  NA 135 136  --   K 3.6 3.4*  --   CL 102 101  --   CO2 27 29  --   GLUCOSE 114* 107*  --   BUN 18 13 17   CREATININE 0.74 0.78 0.65  CALCIUM 7.8* 7.5*  --   MG 2.2  --   --    ------------------------------------------------------------------------------------------------------------------  Cardiac Enzymes No results for input(s): TROPONINI in the last 168 hours. ------------------------------------------------------------------------------------------------------------------  RADIOLOGY:  No results found.   ASSESSMENT AND PLAN:   61 year old male with COPD and chronic respiratory failure on 4-5 L of oxygen who presents with shortness of breath.  1. Acute on chronic hypoxic respiratory failure in the setting of MRSA pneumonia Patient still requiring increased oxygen Continue IV steroids Continue nebs Try to wean to baseline however due to pneumonia I feel that patient's new baseline may be 6 L until pneumonia completely resolved. Check chest x-ray Order ISS  2. MRSA sepsis with pneumonia: Patient has PICC line and will need 4 weeks of IV vancomycin. Stop date is July 19  3. Acute exacerbation of COPD: Patient will need to restart IV steroids due to wheezing Continue nebs and inhalers.  4. Chronic hyponatremia due to overdrinking water. Patient on fluid restriction  5. Anemia of chronic disease  6. Urinary retention: Continue Flomax  Patient will be discharged [facility] when medically stable      Management plans discussed with the patient  and he is in agreement.  CODE STATUS: Partial  TOTAL TIME  TAKING CARE OF THIS PATIENT: 29 minutes.     POSSIBLE D/C tomorrow, DEPENDING ON CLINICAL CONDITION.   Wesleigh Markovic M.D on 02/10/2017 at 8:43 AM  Between 7am to 6pm - Pager - [phone] After 6pm go to [email or web address] - password [email or web address]  [facility]  Office  [phone]  CC: Primary care physician; Candie Chroman, DO  Note: This dictation was prepared with Dragon dictation along with smaller phrase technology. Any transcriptional errors that result from this process are unintentional.

## 2017-02-11 LAB — CREATININE, SERUM
Creatinine, Ser: 0.63 mg/dL (ref 0.61–1.24)
GFR calc non Af Amer: >60 mL/min (ref >60)

## 2017-02-11 LAB — GLUCOSE, CAPILLARY
GLUCOSE-CAPILLARY: 188 mg/dL — AB (ref 65–99)
Glucose-Capillary: 307 mg/dL — ABNORMAL HIGH (ref 65–99)

## 2017-02-11 MED ORDER — CYANOCOBALAMIN 1000 MCG PO TABS: 1000.0000 ug | ORAL_TABLET | Freq: Every day | ORAL | 0 refills | Status: AC

## 2017-02-11 MED ORDER — VANCOMYCIN HCL 10 G IV SOLR
1750.0000 mg | Freq: Two times a day (BID) | INTRAVENOUS | Status: DC
  Filled 2017-02-11: qty 1750

## 2017-02-11 MED ORDER — PREDNISONE 50 MG PO TABS: 50.0000 mg | ORAL_TABLET | Freq: Every day | ORAL | 0 refills | Status: AC

## 2017-02-11 MED ORDER — GUAIFENESIN-DM 100-10 MG/5ML PO SYRP: 5.0000 mL | ORAL_SOLUTION | ORAL | 0 refills | Status: AC | PRN

## 2017-02-11 MED ORDER — VANCOMYCIN HCL 10 G IV SOLR: 1750.0000 mg | Freq: Two times a day (BID) | INTRAVENOUS | 0 refills | Status: AC

## 2017-02-11 MED ORDER — TAMSULOSIN HCL 0.4 MG PO CAPS: 0.4000 mg | ORAL_CAPSULE | Freq: Every day | ORAL | 0 refills | Status: AC

## 2017-02-11 MED ORDER — VANCOMYCIN HCL 10 G IV SOLR: 1250.0000 mg | Freq: Three times a day (TID) | INTRAVENOUS | 60 refills | Status: DC

## 2017-02-11 MED ORDER — DIAZEPAM 10 MG PO TABS: 5.0000 mg | ORAL_TABLET | Freq: Two times a day (BID) | ORAL | 0 refills | Status: AC | PRN

## 2017-02-11 MED ORDER — NYSTATIN 100000 UNIT/ML MT SUSP: 5.0000 mL | Freq: Four times a day (QID) | OROMUCOSAL | 0 refills | Status: AC

## 2017-02-11 NOTE — Progress Notes (Signed)
New referral for Palliative to follow at [facility] received from CSW [facility]. Patient to discharge today. Referral made aware. Thank you. Dayna BarkerKaren Robertson RN, BSN, [facility] [place], hospital Liaison [phone] c

## 2017-02-11 NOTE — Progress Notes (Signed)
Patient is medically stable for D/C to [facility] today. [facility] patient can come today to room 31-A. Clinical Child psychotherapistocial Worker (CSW) sent D/C orders to [facility] via [employer]. RN will call report and arrange EMS for transport. Palliative care to follow, [facility] liaison aware of above. CSW left voicemail for APS supervisor making her aware of above. Patient is aware of above. CSW attempted to contact patient's son Natale LayDaniel Breiner however he did not answer and a voicemail was left. Please reconsult if future social work needs arise. CSW signing off.   [employer] Anaisha Mago, LCSW [phone]

## 2017-02-11 NOTE — Progress Notes (Signed)
Name: Kristopher Powell MRN: [id number] DOB: May 07, 1956    ADMISSION DATE:  01/28/2017  BRIEF PATIENT DESCRIPTION: 61 year old male initially admitted with COPD.  Now transferred from the floor due to altered mental status and hypoxia  due to Pneumonia  SIGNIFICANT EVENTS  6/15 Patient admitted with COPD Exacerbation 6/19 Patient transferred from the floor due to altered , mental status and hypoxia due to PNA  STUDIES:  CT Chest 06/28>>Dense confluent airspace consolidation in the left upper lobe with areas of central necrosis/cavitation is associated with innumerable left greater than right cavitary pulmonary nodules with a central distribution. Imaging features are compatible with pneumonia and multifocal spread. Given the central predominance of the cavitary nodules, septic embolic disease is felt to be less likely, but not entirely excluded. Given the relatively rapid interval onset, metastatic disease is also considered less likely.  SUBJECTIVE:  Pt states he is still slightly short of breath and has chest pain during coughing.   REVIEW OF SYSTEMS: Positives in BOLD Gen: fever, chills, weight change, fatigue, night sweats HEENT: Denies blurred vision, double vision, hearing loss, tinnitus, sinus congestion, rhinorrhea, sore throat, neck stiffness, dysphagia PULM: shortness of breath, cough, sputum production, hemoptysis, wheezing, pleuritic chest pain with coughing CV: Denies chest pain, edema, orthopnea, paroxysmal nocturnal dyspnea, palpitations GI: Denies abdominal pain, nausea, vomiting, diarrhea, hematochezia, melena, constipation, change in bowel habits GU: Denies dysuria, hematuria, polyuria, oliguria, urethral discharge Endocrine: Denies hot or cold intolerance, polyuria, polyphagia or appetite change Derm: Denies rash, dry skin, scaling or peeling skin change Heme: Denies easy bruising, bleeding, bleeding gums Neuro: Denies headache, numbness, weakness, slurred speech, loss  of memory or consciousness   VITAL SIGNS: Temp:  [97.4 F (36.3 C)-98.2 F (36.8 C)] 97.4 F (36.3 C) (06/29 0503) Pulse Rate:  [77-91] 77 (06/29 0503) Resp:  [18-21] 20 (06/29 0503) BP: (101-123)/(55-69) 117/69 (06/29 0503) SpO2:  [81 %-94 %] 81 % (06/29 0802) Weight:  [83.4 kg (183 lb 14.2 oz)] 83.4 kg (183 lb 14.2 oz) (06/29 0503)  PHYSICAL EXAMINATION: General: chronically ill appearing Caucasian male, NAD  Neuro: lethargic, oriented follows commands  HEENT: supple, no JVD  Cardiovascular: nsr, s1s2, rrr, no M/R/G Lungs: diminished throughout with faint rhonchi, even, non labored; no wheezes, rales, or crackles  Abdomen: +BS x4, soft, non tender, non distended  Musculoskeletal:  No edema, cyanosis  Skin: Warm,dry and intact   Recent Labs Lab 02/05/17 0357 02/06/17 0441 02/10/17 0454 02/11/17 0500  NA 135 136  --   --   K 3.6 3.4*  --   --   CL 102 101  --   --   CO2 27 29  --   --   BUN 18 13 17   --   CREATININE 0.74 0.78 0.65 0.63  GLUCOSE 114* 107*  --   --     Recent Labs Lab 02/05/17 0357 02/06/17 0441  HGB 9.0* 9.4*  HCT 26.5* 27.7*  WBC 19.8* 16.6*  PLT 247 259   Dg Chest 1 View  Result Date: 02/10/2017 CLINICAL DATA:  Shortness of breath. EXAM: CHEST 1 VIEW COMPARISON:  04/07/2017 .  01/20/2017 FINDINGS: Interim placement of left PICC line, its tip is overlying the spine and not well visualized. Tip appears to be at the cavoatrial junction . Stable cardiomegaly Diffuse bilateral pulmonary infiltrates with dense infiltrate and consolidation left upper lobe. No signet change from prior exam. No pleural effusion or pneumothorax . IMPRESSION: 1. Interim placement of left PICC line.  Its tip is overlying the spine and difficult to visualize, tip appears to be at the cavoatrial junction. 2. Diffuse bilateral pulmonary infiltrates with particularly dense infiltrate and consolidation left upper lobe. No significant change prior exam. Findings most consistent with  bilateral pneumonia. Electronically Signed   By: Maisie Fushomas  Register   On: 02/10/2017 09:22   Ct Chest Wo Contrast  Result Date: 02/10/2017 CLINICAL DATA:  Chest pain and shortness of breath for 2 months. EXAM: CT CHEST WITHOUT CONTRAST TECHNIQUE: Multidetector CT imaging of the chest was performed following the standard protocol without IV contrast. COMPARISON:  08/17/2016 FINDINGS: Cardiovascular: Heart is enlarged. Coronary artery calcification is noted. Atherosclerotic calcification is noted in the wall of the thoracic aorta. Left PICC line tip projects at the distal SVC level. Mediastinum/Nodes: Mild mediastinal lymphadenopathy is identified with 12 mm short axis precarinal lymph node and 12 mm short axis AP window lymph node as index lesions. Fullness in the hilar regions suggests associated nodal tissue although assessment is limited by lack of intravenous contrast material. The esophagus has normal imaging features. There is no axillary lymphadenopathy. Lungs/Pleura: Confluent airspace consolidation in the left upper lobe shows numerous areas of central necrosis/cavitation. Interstitial and alveolar ground-glass attenuation is identified in the right upper lobe. Innumerable cavitary pulmonary nodules are seen bilaterally, left greater than right. Underlying emphysema is evident. Upper Abdomen: Unremarkable. Musculoskeletal: Bone windows reveal no worrisome lytic or sclerotic osseous lesions. IMPRESSION: 1. Dense confluent airspace consolidation in the left upper lobe with areas of central necrosis/cavitation is associated with innumerable left greater than right cavitary pulmonary nodules with a central distribution. Imaging features are compatible with pneumonia and multifocal spread. Given the central predominance of the cavitary nodules, septic embolic disease is felt to be less likely, but not entirely excluded. Given the relatively rapid interval onset, metastatic disease is also considered less  likely. Emphysema. ([id number](ICD10-J43.9) Aortic Atherosclerois (ICD10-170.0) Electronically Signed   By: Kennith CenterEric  Mansell M.D.   On: 02/10/2017 18:08  I have Independently reviewed images of  CXR   on 02/11/2017 Interpretation:slight worsening of left sided opacification    ASSESSMENT / PLAN: Severe COPD with acute exacerbation from MRSA pneumonia- very slow improvement with continued wheezing and SOB P: Continue supplemental O2 for dyspnea and/or to maintain O2 sats 88% to 92% Continue scheduled bronchodilator therapy and nebulized steroids  Continue vancomycin  PICC line placed 06/27 for long term abx therapy  Would recommend pt remain hospitalized over the weekend due to required increase dose of iv steroids secondary to worsening respiratory failure  Pt needs bronchoscopy, however Dr. Chales AbrahamsGupta had an extensive discussion with pt and he refused bronchoscopy   Sonda Rumbleana Cletus Mehlhoff, AGNP  Pulmonary/Critical Care Pager [phone] (please enter 7 digits) PCCM Consult Pager [phone] (please enter 7 digits)

## 2017-02-11 NOTE — Progress Notes (Signed)
PHARMACY CONSULT NOTE FOR:  OUTPATIENT  PARENTERAL ANTIBIOTIC THERAPY (OPAT)  Indication: MRSA Bacteremia, MRSA PNA Regimen: Vancomycin 1750mg  IV every 12 hours  End date: 03/03/2017  IV antibiotic discharge orders are pended. To discharging provider:  please sign these orders via discharge navigator,  Select New Orders & click on the button choice - Manage This Unsigned Work.     Thank you for allowing pharmacy to be a part of this patient's care.  Kristopher Powell M Izzie Geers 02/11/2017, 11:59 AM

## 2017-02-11 NOTE — Progress Notes (Signed)
Report called Lavinia SharpsKaren Woods,RN of [facility]. Transportation called. Pt is third in line for transport.

## 2017-02-11 NOTE — Progress Notes (Signed)
Discharge instruction reviewed with patient. Ambulance transport here for patient, PICC line to left FA clean,dry and intact. VS stable, patient denies pain prior to discharge.

## 2017-02-11 NOTE — Clinical Social Work Placement (Signed)
   CLINICAL SOCIAL WORK PLACEMENT  NOTE  Date:  02/11/2017  Patient Details  Name: Kristopher Powell MRN: [id number] Date of Birth: 10/06/1955  Clinical Social Work is seeking post-discharge placement for this patient at the Skilled  Nursing Facility level of care (*CSW will initial, date and re-position this form in  chart as items are completed):  Yes   Patient/family provided with [place] Clinical Social Work Department's list of facilities offering this level of care within the geographic area requested by the patient (or if unable, by the patient's family).  Yes   Patient/family informed of their freedom to choose among providers that offer the needed level of care, that participate in Medicare, Medicaid or managed care program needed by the patient, have an available bed and are willing to accept the patient.  Yes   Patient/family informed of [facility] ownership interest in [facility] and [facility], as well as of the fact that they are under no obligation to receive care at these facilities.  PASRR submitted to EDS on       PASRR number received on       Existing PASRR number confirmed on 02/01/17     FL2 transmitted to all facilities in geographic area requested by pt/family on 02/01/17     FL2 transmitted to all facilities within larger geographic area on       Patient informed that his/her managed care company has contracts with or will negotiate with certain facilities, including the following:        Yes   Patient/family informed of bed offers received.  Patient chooses bed at  [facility] )     Physician recommends and patient chooses bed at      Patient to be transferred to  [facility] ) on 02/11/17.  Patient to be transferred to facility by  [facility] )     Patient family notified on 02/11/17 of transfer.  Name of family member notified:   (CSW left patient's son Natale LayDaniel Okazaki a voicemail. )     PHYSICIAN        Additional Comment:    _______________________________________________ Kristopher Powell, Kristopher CrockerBailey M, LCSW 02/11/2017, 9:07 AM

## 2017-02-11 NOTE — Discharge Summary (Addendum)
[facility] [place] [facility]   PATIENT NAME: Kristopher Powell    MR#:  [id number]  DATE OF BIRTH:  01/08/1956  DATE OF ADMISSION:  01/28/2017 ADMITTING PHYSICIAN: Milagros LollSrikar Sudini, MD  DATE OF DISCHARGE: 02/11/2017  PRIMARY CARE PHYSICIAN: Candie ChromanBarzin, Amir Homayoun, DO    ADMISSION DIAGNOSIS:  Hyponatremia [E87.1] Weakness [R53.1] COPD exacerbation (HCC) [J44.1]  DISCHARGE DIAGNOSIS:  Active Problems:   COPD exacerbation (HCC)   Weakness   DNR (do not resuscitate) discussion   Goals of care, counseling/discussion   Palliative care encounter   Shortness of breath   SECONDARY DIAGNOSIS:   Past Medical History:  Diagnosis Date  . CHF (congestive heart failure) (HCC)   . COPD (chronic obstructive pulmonary disease) (HCC)   . Diabetes mellitus without complication (HCC)   . Hypertension   . MI (myocardial infarction) [facility](HCC) 2006    HOSPITAL COURSE:  61 year old male with COPD and chronic respiratory failure on 4-5 L of oxygen who presents with shortness of breath.  1. Acute on chronic hypoxic respiratory failure in the setting of MRSA pneumonia Patient will need to be on a new 6 liters of O2 with plan to try to wean back to baseline to 4.5 as an outpatient once he recovers from his PNA and COPD exacerbation.  Patient was offered bronchoscopy by pulmonologist however patient refused. Patient is at high risk for readmission. 2. MRSA sepsis with pneumonia: Patient has PICC line and will need 4 weeks of IV vancomycin. Stop date is July 19 vancomycin trough needs to be twice a week next one checked Sunday and creatinine twice a week and [facility].  3. Acute exacerbation of COPD: Patient will be on oral steroids. Continue nebs and inhalers.  4. Chronic hyponatremia due to overdrinking water. Patient on 2000 CC fluid restriction  5. Anemia of chronic disease  6. Urinary retention: Continue Flomax  7. Diet controlled DM: ADa  diet  Patient will be discharged [facility] with palliative care  DISCHARGE CONDITIONS AND DIET:   Stable [place] diet  CONSULTS OBTAINED:  Treatment Team:  Mick SellFitzgerald, David P, MD  DRUG ALLERGIES:  No Known Allergies  DISCHARGE MEDICATIONS:   Current Discharge Medication List    START taking these medications   Details  guaiFENesin-dextromethorphan (ROBITUSSIN DM) 100-10 MG/5ML syrup Take 5 mLs by mouth every 4 (four) hours as needed for cough. Qty: 118 mL, Refills: 0    nystatin (MYCOSTATIN) 100000 UNIT/ML suspension Take 5 mLs (500,000 Units total) by mouth 4 (four) times daily. Qty: 60 mL, Refills: 0    predniSONE (DELTASONE) 50 MG tablet Take 1 tablet (50 mg total) by mouth daily with breakfast. Qty: 5 tablet, Refills: 0    tamsulosin (FLOMAX) 0.4 MG CAPS capsule Take 1 capsule (0.4 mg total) by mouth daily. Qty: 30 capsule, Refills: 0    vancomycin 1,750 mg in sodium chloride 0.9 % 500 mL Inject 1,750 mg into the vein every 12 (twelve) hours. Qty: 1750 mg, Refills: 0    vitamin B-12 1000 MCG tablet Take 1 tablet (1,000 mcg total) by mouth daily. Qty: 30 tablet, Refills: 0      CONTINUE these medications which have CHANGED   Details  diazepam (VALIUM) 10 MG tablet Take 0.5 tablets (5 mg total) by mouth every 12 (twelve) hours as needed for anxiety. Qty: 20 tablet, Refills: 0      CONTINUE these medications which have NOT CHANGED   Details  albuterol (PROVENTIL HFA;VENTOLIN HFA) 108 (90  Base) MCG/ACT inhaler Inhale 2 puffs into the lungs every 6 (six) hours as needed for wheezing or shortness of breath.    amLODipine (NORVASC) 5 MG tablet Take 5 mg by mouth daily.    aspirin EC 325 MG tablet Take 325 mg by mouth daily.    esomeprazole (NEXIUM) 40 MG capsule Take 40 mg by mouth daily.    Fluticasone-Salmeterol (ADVAIR) 100-50 MCG/DOSE AEPB Inhale 1 puff into the lungs 2 (two) times daily. Qty: 60 each, Refills: 2    gabapentin (NEURONTIN) 300 MG  capsule Take 1,200 mg by mouth 3 (three) times daily.     imipramine (TOFRANIL) 50 MG tablet Take 200 mg by mouth at bedtime.    ipratropium-albuterol (DUONEB) 0.5-2.5 (3) MG/3ML SOLN Take 3 mLs by nebulization every 6 (six) hours. Qty: 360 mL, Refills: 0    metoprolol succinate (TOPROL XL) 25 MG 24 hr tablet Take 25 mg by mouth daily.    risperiDONE (RISPERDAL) 2 MG tablet Take 4 mg by mouth at bedtime.          Today   CHIEF COMPLAINT:  doing ok this am Cough wheezing better   VITAL SIGNS:  Blood pressure (!) 115/91, pulse (!) 51, temperature 98.1 F (36.7 C), resp. rate (!) 21, height 6' (1.829 m), weight 83.4 kg (183 lb 14.2 oz), SpO2 91 %.   REVIEW OF SYSTEMS:  Review of Systems  Constitutional: Positive for malaise/fatigue. Negative for chills and fever.  HENT: Negative.  Negative for ear discharge, ear pain, hearing loss, nosebleeds and sore throat.   Eyes: Negative.  Negative for blurred vision and pain.  Respiratory: Positive for cough and shortness of breath (better). Negative for hemoptysis and wheezing.   Cardiovascular: Negative.  Negative for chest pain, palpitations and leg swelling.  Gastrointestinal: Negative.  Negative for abdominal pain, blood in stool, diarrhea, nausea and vomiting.  Genitourinary: Negative.  Negative for dysuria.  Musculoskeletal: Negative.  Negative for back pain.  Skin: Negative.   Neurological: Positive for weakness. Negative for dizziness, tremors, speech change, focal weakness, seizures and headaches.  Endo/Heme/Allergies: Negative.  Does not bruise/bleed easily.  Psychiatric/Behavioral: Negative.  Negative for depression, hallucinations and suicidal ideas.     PHYSICAL EXAMINATION:  GENERAL:  61 y.o.-year-old patient lying in the bed with no acute distress.  NECK:  Supple, no jugular venous distention. No thyroid enlargement, no tenderness.  LUNGS: LUL rhonchii NO  wheezing, rales  No use of accessory muscles of respiration.   CARDIOVASCULAR: S1, S2 normal. No murmurs, rubs, or gallops.  ABDOMEN: Soft, non-tender, non-distended. Bowel sounds present. No organomegaly or mass.  EXTREMITIES: No pedal edema, cyanosis, or clubbing.  PSYCHIATRIC: The patient is alert and oriented x 3.  SKIN: No obvious rash, lesion, or ulcer.   DATA REVIEW:   CBC  Recent Labs Lab 02/06/17 0441  WBC 16.6*  HGB 9.4*  HCT 27.7*  PLT 259    Chemistries   Recent Labs Lab 02/05/17 0357 02/06/17 0441 02/10/17 0454 02/11/17 0500  NA 135 136  --   --   K 3.6 3.4*  --   --   CL 102 101  --   --   CO2 27 29  --   --   GLUCOSE 114* 107*  --   --   BUN 18 13 17   --   CREATININE 0.74 0.78 0.65 0.63  CALCIUM 7.8* 7.5*  --   --   MG 2.2  --   --   --  Cardiac Enzymes No results for input(s): TROPONINI in the last 168 hours.  Microbiology Results  @[email or web address] @  RADIOLOGY:  Dg Chest 1 View  Result Date: 02/10/2017 CLINICAL DATA:  Shortness of breath. EXAM: CHEST 1 VIEW COMPARISON:  04/07/2017 .  01/20/2017 FINDINGS: Interim placement of left PICC line, its tip is overlying the spine and not well visualized. Tip appears to be at the cavoatrial junction . Stable cardiomegaly Diffuse bilateral pulmonary infiltrates with dense infiltrate and consolidation left upper lobe. No signet change from prior exam. No pleural effusion or pneumothorax . IMPRESSION: 1. Interim placement of left PICC line. Its tip is overlying the spine and difficult to visualize, tip appears to be at the cavoatrial junction. 2. Diffuse bilateral pulmonary infiltrates with particularly dense infiltrate and consolidation left upper lobe. No significant change prior exam. Findings most consistent with bilateral pneumonia. Electronically Signed   By: Maisie Fus  Register   On: 02/10/2017 09:22   Ct Chest Wo Contrast  Result Date: 02/10/2017 CLINICAL DATA:  Chest pain and shortness of breath for 2 months. EXAM: CT CHEST WITHOUT CONTRAST TECHNIQUE: Multidetector CT  imaging of the chest was performed following the standard protocol without IV contrast. COMPARISON:  08/17/2016 FINDINGS: Cardiovascular: Heart is enlarged. Coronary artery calcification is noted. Atherosclerotic calcification is noted in the wall of the thoracic aorta. Left PICC line tip projects at the distal SVC level. Mediastinum/Nodes: Mild mediastinal lymphadenopathy is identified with 12 mm short axis precarinal lymph node and 12 mm short axis AP window lymph node as index lesions. Fullness in the hilar regions suggests associated nodal tissue although assessment is limited by lack of intravenous contrast material. The esophagus has normal imaging features. There is no axillary lymphadenopathy. Lungs/Pleura: Confluent airspace consolidation in the left upper lobe shows numerous areas of central necrosis/cavitation. Interstitial and alveolar ground-glass attenuation is identified in the right upper lobe. Innumerable cavitary pulmonary nodules are seen bilaterally, left greater than right. Underlying emphysema is evident. Upper Abdomen: Unremarkable. Musculoskeletal: Bone windows reveal no worrisome lytic or sclerotic osseous lesions. IMPRESSION: 1. Dense confluent airspace consolidation in the left upper lobe with areas of central necrosis/cavitation is associated with innumerable left greater than right cavitary pulmonary nodules with a central distribution. Imaging features are compatible with pneumonia and multifocal spread. Given the central predominance of the cavitary nodules, septic embolic disease is felt to be less likely, but not entirely excluded. Given the relatively rapid interval onset, metastatic disease is also considered less likely. Emphysema. (ZOX09-U04.9) Aortic Atherosclerois (ICD10-170.0) Electronically Signed   By: Kennith Center M.D.   On: 02/10/2017 18:08      Current Discharge Medication List    START taking these medications   Details  guaiFENesin-dextromethorphan (ROBITUSSIN  DM) 100-10 MG/5ML syrup Take 5 mLs by mouth every 4 (four) hours as needed for cough. Qty: 118 mL, Refills: 0    nystatin (MYCOSTATIN) 100000 UNIT/ML suspension Take 5 mLs (500,000 Units total) by mouth 4 (four) times daily. Qty: 60 mL, Refills: 0    predniSONE (DELTASONE) 50 MG tablet Take 1 tablet (50 mg total) by mouth daily with breakfast. Qty: 5 tablet, Refills: 0    tamsulosin (FLOMAX) 0.4 MG CAPS capsule Take 1 capsule (0.4 mg total) by mouth daily. Qty: 30 capsule, Refills: 0    vancomycin 1,750 mg in sodium chloride 0.9 % 500 mL Inject 1,750 mg into the vein every 12 (twelve) hours. Qty: 1750 mg, Refills: 0    vitamin B-12 1000 MCG tablet Take 1 tablet (1,000  mcg total) by mouth daily. Qty: 30 tablet, Refills: 0      CONTINUE these medications which have CHANGED   Details  diazepam (VALIUM) 10 MG tablet Take 0.5 tablets (5 mg total) by mouth every 12 (twelve) hours as needed for anxiety. Qty: 20 tablet, Refills: 0      CONTINUE these medications which have NOT CHANGED   Details  albuterol (PROVENTIL HFA;VENTOLIN HFA) 108 (90 Base) MCG/ACT inhaler Inhale 2 puffs into the lungs every 6 (six) hours as needed for wheezing or shortness of breath.    amLODipine (NORVASC) 5 MG tablet Take 5 mg by mouth daily.    aspirin EC 325 MG tablet Take 325 mg by mouth daily.    esomeprazole (NEXIUM) 40 MG capsule Take 40 mg by mouth daily.    Fluticasone-Salmeterol (ADVAIR) 100-50 MCG/DOSE AEPB Inhale 1 puff into the lungs 2 (two) times daily. Qty: 60 each, Refills: 2    gabapentin (NEURONTIN) 300 MG capsule Take 1,200 mg by mouth 3 (three) times daily.     imipramine (TOFRANIL) 50 MG tablet Take 200 mg by mouth at bedtime.    ipratropium-albuterol (DUONEB) 0.5-2.5 (3) MG/3ML SOLN Take 3 mLs by nebulization every 6 (six) hours. Qty: 360 mL, Refills: 0    metoprolol succinate (TOPROL XL) 25 MG 24 hr tablet Take 25 mg by mouth daily.    risperiDONE (RISPERDAL) 2 MG tablet Take 4  mg by mouth at bedtime.           Management plans discussed with the patient and he is in agreement. Stable for discharge SNF with PC  Patient should follow up with  pcp CODE STATUS:     Code Status Orders        Start     Ordered   01/28/17 1407  Limited resuscitation (code)  Continuous    Question Answer Comment  In the event of cardiac or respiratory ARREST: Initiate Code Blue, Call Rapid Response Yes   In the event of cardiac or respiratory ARREST: Perform CPR Yes   In the event of cardiac or respiratory ARREST: Perform Intubation/Mechanical Ventilation No   In the event of cardiac or respiratory ARREST: Use NIPPV/BiPAp only if indicated Yes   In the event of cardiac or respiratory ARREST: Administer ACLS medications if indicated Yes   In the event of cardiac or respiratory ARREST: Perform Defibrillation or Cardioversion if indicated Yes      01/28/17 1408    Code Status History    Date Active Date Inactive Code Status Order ID Comments User Context   01/24/2017  1:56 PM 01/25/2017  4:17 PM Full Code [id number]  Enedina Finner, MD Inpatient   01/15/2017 12:37 AM 01/16/2017  4:17 PM Full Code [id number]  Oralia Manis, MD Inpatient   01/07/2017  7:48 PM 01/09/2017  6:41 PM Full Code [id number]  Altamese Dilling, MD ED   09/05/2016  6:08 PM 09/07/2016  7:37 PM Full Code [id number]  Marguarite Arbour, MD Inpatient   08/14/2016 10:48 AM 08/19/2016  7:57 PM Full Code [id number]  Arnaldo Natal, MD Inpatient   08/06/2016 11:40 PM 08/07/2016  4:34 PM Full Code [id number]  Tonye Royalty, DO Inpatient   05/23/2015 10:14 PM 05/25/2015  2:59 PM Full Code [id number]  Altamese Dilling, MD Inpatient      TOTAL TIME TAKING CARE OF THIS PATIENT: 37 minutes.    Note: This dictation was prepared with Dragon dictation along with smaller phrase technology. Any transcriptional errors that result  from this process are unintentional.  Princetta Uplinger M.D on 02/11/2017 at 11:42 AM  Between 7am  to 6pm - Pager - [phone] After 6pm go to [email or web address] - Social research officer, government  [facility]  Office  [phone]  CC: Primary care physician; Candie Chroman, DO

## 2017-02-11 NOTE — Progress Notes (Signed)
Daily Progress Note   Patient Name: Kristopher Powell       Date: 02/11/2017 DOB: 12/24/1955  Age: 61 y.o. MRN#: [id number] Attending Physician: Kristopher SaranMody, Sital, MD Primary Care Physician: Kristopher ChromanBarzin, Amir Homayoun, DO Admit Date: 01/28/2017  Reason for Consultation/Follow-up: Establishing goals of care  Subjective: Kristopher Powell continues with labored breathing, feels "maybe a little better." Continues to ask for water.   Length of Stay: 14  Current Medications: Scheduled Meds:  . aspirin EC  325 mg Oral Daily  . budesonide (PULMICORT) nebulizer solution  0.5 mg Nebulization BID  . enoxaparin (LOVENOX) injection  40 mg Subcutaneous Q24H  . folic acid  1 mg Oral Daily  . gabapentin  1,200 mg Oral TID  . imipramine  200 mg Oral QHS  . insulin aspart  0-15 Units Subcutaneous TID WC  . insulin aspart  0-5 Units Subcutaneous QHS  . ipratropium-albuterol  3 mL Nebulization Q4H  . methylPREDNISolone (SOLU-MEDROL) injection  60 mg Intravenous Q6H  . metoprolol succinate  25 mg Oral Daily  . multivitamin with minerals  1 tablet Oral Daily  . nystatin  5 mL Oral QID  . pantoprazole  40 mg Oral Daily  . risperiDONE  4 mg Oral QHS  . sodium chloride flush  3 mL Intravenous Q12H  . tamsulosin  0.4 mg Oral Daily  . vitamin B-12  1,000 mcg Oral Daily    Continuous Infusions: . vancomycin 1,250 mg (02/11/17 1028)    PRN Meds: acetaminophen **OR** acetaminophen, benzonatate, diazepam, guaiFENesin-dextromethorphan, ipratropium-albuterol, meloxicam, ondansetron **OR** ondansetron (ZOFRAN) IV, polyethylene glycol, sodium chloride flush, sodium chloride flush  Physical Exam         Constitutional: He is oriented to person, place, and time. He appears well-developed.  HENT:  Head: Normocephalic and  atraumatic.  Cardiovascular: Normal rate and regular rhythm.   Pulmonary/Chest: No accessory muscle usage. No tachypnea. He is in mod-severe respiratory distress. He has decreased breath sounds. He has wheezes.  Abdominal: Soft. Normal appearance.  Neurological: He is alert and oriented to person, place, and time.  Vitals reviewed.   Vital Signs: BP 117/69 (BP Location: Right Arm)   Pulse 77   Temp 97.4 F (36.3 C) (Oral)   Resp 20   Ht 6' (1.829 m)   Wt 83.4 kg (183 lb 14.2 oz)  SpO2 (!) 81% Comment: sleeping; tx given O2=90%  BMI 24.94 kg/m  SpO2: SpO2: (!) 81 % (sleeping; tx given O2=90%) O2 Device: O2 Device: Nasal Cannula O2 Flow Rate: O2 Flow Rate (L/min): 6 L/min  Intake/output summary:   Intake/Output Summary (Last 24 hours) at 02/11/17 1054 Last data filed at 02/11/17 1610  Gross per 24 hour  Intake             1250 ml  Output             2351 ml  Net            -1101 ml   LBM: Last BM Date: 02/10/17 (per pt ) Baseline Weight: Weight: 77.1 kg (170 lb) Most recent weight: Weight: 83.4 kg (183 lb 14.2 oz)       Palliative Assessment/Data: 20%    Flowsheet Rows     Most Recent Value  Intake Tab  Referral Department  Hospitalist  Unit at Time of Referral  Cardiac/Telemetry Unit  Palliative Care Primary Diagnosis  Pulmonary  Date Notified  02/07/17  Palliative Care Type  New Palliative care  Reason for referral  Clarify Goals of Care  Date of Admission  01/28/17  # of days IP prior to Palliative referral  10  Clinical Assessment  Psychosocial & Spiritual Assessment  Palliative Care Outcomes      Patient Active Problem List   Diagnosis Date Noted  . Shortness of breath   . DNR (do not resuscitate) discussion   . Goals of care, counseling/discussion   . Palliative care encounter   . Weakness   . COPD exacerbation (HCC) 01/28/2017  . 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 Tardive dyskinesia 01/24/2017  . SIRS (systemic inflammatory response syndrome)  (HCC) 09/05/2016  . Acute URI 09/05/2016  . Acute respiratory failure (HCC) 09/05/2016  . Acute on chronic respiratory failure with hypoxia and hypercapnia (HCC) 08/14/2016  . Chest pain 08/07/2016  . Dehydration with hyponatremia 08/06/2016  . Chest pain, rule out acute myocardial infarction 08/06/2016  . CHF (congestive heart failure) (HCC)   . Generalized anxiety disorder 08/14/2015  . Benzodiazepine abuse 08/14/2015  . Acute delirium 08/14/2015  . Diabetes (HCC) 08/14/2015  . COPD (chronic obstructive pulmonary disease) (HCC) 08/14/2015  . Suicidal ideation 08/14/2015  . Hyponatremia 05/23/2015  . Hypokalemia 05/23/2015    Palliative Care Assessment & Plan   HPI: 61 y.o. male  with past medical history of COPD, tobacco use, chronic respiratory failure on 4 L oxygen, neuropathy, GERD, depression, current smoker admitted on 01/28/2017 with respiratory distress r/t COPD exacerbation with ongoing tobacco use and recurrent hyponatremia. Also with MRSA bacteremia and left lung pneumonia requiring 4 weeks of IV antibiotics.    Assessment: Kristopher Powell continues to be hopeful for improvement. He admits he is scared with how difficult this has been and lack of improvement. He has poor understanding of his disease process and trajectory. We discussed ongoing smoking and the importance of tobacco cessation. He tells me that smoking helps him breathe better - I reassured him that it does NOT make him breathe better but that it may help his anxiety but it is making his breathing MUCH worse. I do not feel he believes me - no desire to stop smoking or even decrease amount.   I also readdressed code status - educating that upon discharge he can have a DNR that would ensure he would not be intubated but that he would not be given CPR/defib either vs full  code. He opts for FULL code but still does not desire to be intubated. He does say he would not want to be kept on vent if this were to occur and that his  son Kristopher Powell knows this.   Recommendations/Plan:  Per PCCM/attending.  Consider Morphine 2 mg PO every 4 hours for relief of dyspnea.   Goals of Care and Additional Recommendations:  Limitations on Scope of Treatment: Full Scope Treatment outside [facility]  Code Status:  Limited code [facility] (FULL code upon discharge)  Prognosis:   Difficult to say but appears very poor given slow progression with continued severe symptom burden. Would be eligible for hospice if goals aligned.   Discharge Planning:  Skilled Nursing Facility for rehab with Palliative care service follow-up is the goal   Thank you for allowing the Palliative Medicine Team to assist in the care of this patient.   Total Time Prolonged Time Billed  no       Greater than 50%  of this time was spent counseling and coordinating care related to the above assessment and plan.  Yong Channel, NP Palliative Medicine Team Pager # [phone] (M-F 8a-5p) Team Phone # [phone] (Nights/Weekends)

## 2017-02-16 ENCOUNTER — Other Ambulatory Visit
Admission: RE | Admit: 2017-02-16 | Discharge: 2017-02-16 | Disposition: A | Discharge status: Home / Self Care | Payer: Medicaid Other | Source: Skilled Nursing Facility | Attending: Internal Medicine | Admitting: Internal Medicine

## 2017-02-16 DIAGNOSIS — Z5181 Encounter for therapeutic drug level monitoring: Secondary | ICD-10-CM | POA: Insufficient documentation

## 2017-02-16 LAB — VANCOMYCIN, TROUGH: Vancomycin Tr: 13 ug/mL — ABNORMAL LOW (ref 15–20)

## 2017-04-21 ENCOUNTER — Other Ambulatory Visit: Payer: Self-pay

## 2017-04-21 ENCOUNTER — Emergency Department: Payer: Medicaid Other

## 2017-04-21 ENCOUNTER — Encounter: Payer: Self-pay | Admitting: Emergency Medicine

## 2017-04-21 ENCOUNTER — Emergency Department
Admission: EM | Admit: 2017-04-21 | Discharge: 2017-04-22 | Disposition: A | Discharge status: Home / Self Care | Payer: Medicaid Other | Attending: Emergency Medicine | Admitting: Emergency Medicine

## 2017-04-21 DIAGNOSIS — E119 Type 2 diabetes mellitus without complications: Secondary | ICD-10-CM | POA: Insufficient documentation

## 2017-04-21 DIAGNOSIS — J449 Chronic obstructive pulmonary disease, unspecified: Secondary | ICD-10-CM | POA: Insufficient documentation

## 2017-04-21 DIAGNOSIS — R0602 Shortness of breath: Secondary | ICD-10-CM | POA: Diagnosis present

## 2017-04-21 DIAGNOSIS — F17210 Nicotine dependence, cigarettes, uncomplicated: Secondary | ICD-10-CM | POA: Insufficient documentation

## 2017-04-21 DIAGNOSIS — I509 Heart failure, unspecified: Secondary | ICD-10-CM | POA: Diagnosis not present

## 2017-04-21 DIAGNOSIS — F430 Acute stress reaction: Secondary | ICD-10-CM

## 2017-04-21 DIAGNOSIS — Z955 Presence of coronary angioplasty implant and graft: Secondary | ICD-10-CM | POA: Diagnosis not present

## 2017-04-21 DIAGNOSIS — I110 Hypertensive heart disease with heart failure: Secondary | ICD-10-CM | POA: Diagnosis not present

## 2017-04-21 DIAGNOSIS — F410 Panic disorder [episodic paroxysmal anxiety] without agoraphobia: Secondary | ICD-10-CM | POA: Insufficient documentation

## 2017-04-21 DIAGNOSIS — R079 Chest pain, unspecified: Secondary | ICD-10-CM | POA: Diagnosis not present

## 2017-04-21 DIAGNOSIS — J189 Pneumonia, unspecified organism: Secondary | ICD-10-CM | POA: Diagnosis not present

## 2017-04-21 LAB — CBC
HCT: 39 % — ABNORMAL LOW (ref 40.0–52.0)
Hemoglobin: 13.2 g/dL (ref 13.0–18.0)
MCH: 27.3 pg (ref 26.0–34.0)
MCHC: 33.9 g/dL (ref 32.0–36.0)
MCV: 80.6 fL (ref 80.0–100.0)
PLATELETS: 357 10*3/uL (ref 150–440)
RBC: 4.84 MIL/uL (ref 4.40–5.90)
RDW: 16 % — AB (ref 11.5–14.5)
WBC: 17.2 10*3/uL — ABNORMAL HIGH (ref 3.8–10.6)

## 2017-04-21 LAB — BASIC METABOLIC PANEL
Anion gap: 13 (ref 5–15)
BUN: 6 mg/dL (ref 6–20)
CHLORIDE: 101 mmol/L (ref 101–111)
CO2: 23 mmol/L (ref 22–32)
CREATININE: 0.65 mg/dL (ref 0.61–1.24)
Calcium: 9.1 mg/dL (ref 8.9–10.3)
GFR calc Af Amer: >60 mL/min (ref >60)
GFR calc non Af Amer: >60 mL/min (ref >60)
Glucose, Bld: 112 mg/dL — ABNORMAL HIGH (ref 65–99)
Potassium: 3.3 mmol/L — ABNORMAL LOW (ref 3.5–5.1)
Sodium: 137 mmol/L (ref 135–145)

## 2017-04-21 LAB — TROPONIN I
Troponin I: <0.03 ng/mL (ref <0.03)
Troponin I: <0.03 ng/mL (ref <0.03)

## 2017-04-21 MED ORDER — AZITHROMYCIN 250 MG PO TABS: ORAL_TABLET | ORAL | 0 refills | Status: AC

## 2017-04-21 MED ORDER — DIAZEPAM 5 MG PO TABS
5.0000 mg | ORAL_TABLET | Freq: Once | ORAL | Status: AC
  Administered 2017-04-21: 5 mg via ORAL
  Filled 2017-04-21: qty 1

## 2017-04-21 NOTE — ED Triage Notes (Signed)
Patient brought in by ems. Patient states that he thinks that he is having a panic attack. Patient states that he just found out that a friend passed tonight. Patient states that when he found out he started having left side chest pain and shortness of breath. Patient states that he is having similar symptoms to previous panic attack. Patient has history of COPD and wear 5l oxygen.

## 2017-04-21 NOTE — Discharge Instructions (Signed)
Please make an appointment with your primary care physician for reevaluation. Please take the entire course of antibiotics, even if you're feeling better. Return to the emergency department if you develop chest pain, shortness of breath, thoughts of hurting herself or anyone else, hallucinations, or any other symptoms concerning to you.

## 2017-04-21 NOTE — ED Provider Notes (Signed)
[facility] Emergency Department Provider Note  ____________________________________________  Time seen: Approximately 11:26 PM  I have reviewed the triage vital signs and the nursing notes.   HISTORY  Chief Complaint Panic Attack; Shortness of Breath; and Chest Pain    HPI Kristopher Powell is a 61 y.o. male with a history of severe anxiety, COPD on supplemental O2, CAD status post MI, CHF, HTN, DM, presenting with panic attack, chest pain and shortness of breath. The patient reports that he washed his friend die today and afterwards developed a central chest pain that did not radiate associated with shortness of breath. This is typical of his prior panic attacks and did not feel different character or severity to his prior panic attacks. Usually he takes diazepam, but was out of his medications. He denies any associated lightheadedness or syncope, diaphoresis, nausea or vomiting, palpitations. No large semi-swelling or calf pain. He has not had any recent illness including cough or cold symptoms, fever or chills.   Past Medical History:  Diagnosis Date  . CHF (congestive heart failure) (HCC)   . COPD (chronic obstructive pulmonary disease) (HCC)   . Diabetes mellitus without complication (HCC)   . Hypertension   . MI (myocardial infarction) [facility] 2006    Patient Active Problem List   Diagnosis Date Noted  . Shortness of breath   . DNR (do not resuscitate) discussion   . Goals of care, counseling/discussion   . Palliative care encounter   . Weakness   . COPD exacerbation (HCC) 01/28/2017  . 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 Tardive dyskinesia 01/24/2017  . SIRS (systemic inflammatory response syndrome) (HCC) 09/05/2016  . Acute URI 09/05/2016  . Acute respiratory failure (HCC) 09/05/2016  . Acute on chronic respiratory failure with hypoxia and hypercapnia (HCC) 08/14/2016  . Chest pain 08/07/2016  . Dehydration with hyponatremia 08/06/2016  .  Chest pain, rule out acute myocardial infarction 08/06/2016  . CHF (congestive heart failure) (HCC)   . Generalized anxiety disorder 08/14/2015  . Benzodiazepine abuse 08/14/2015  . Acute delirium 08/14/2015  . Diabetes (HCC) 08/14/2015  . COPD (chronic obstructive pulmonary disease) (HCC) 08/14/2015  . Suicidal ideation 08/14/2015  . Hyponatremia 05/23/2015  . Hypokalemia 05/23/2015    Past Surgical History:  Procedure Laterality Date  . APPENDECTOMY    . CHOLECYSTECTOMY    . CORONARY ANGIOPLASTY WITH STENT PLACEMENT      Current Outpatient Rx  . Order #: [id number] Class: Historical Med  . Order #: [id number] Class: Historical Med  . Order #: [id number] Class: Historical Med  . Order #: [id number] Class: Print  . Order #: [id number] Class: No Print  . Order #: [id number] Class: Historical Med  . Order #: [id number] Class: Print  . Order #: [id number] Class: Historical Med  . Order #: [id number] Class: Normal  . Order #: [id number] Class: Historical Med  . Order #: [id number] Class: Normal  . Order #: [id number] Class: Historical Med  . Order #: [id number] Class: Normal  . Order #: [id number] Class: Print  . Order #: [id number] Class: Historical Med  . Order #: [id number] Class: Normal  . Order #: [id number] Class: Normal    Allergies Patient has no known allergies.  Family History  Problem Relation Age of Onset  . CAD Mother        deceased 61  . Stroke Father        deceased 53  . Heart attack Brother        deceased 92    Social History Social History  Substance Use Topics  . Smoking status:  Current Every Day Smoker    Packs/day: 1.00    Years: 40.00    Types: Cigarettes  . Smokeless tobacco: Never Used  . Alcohol use Yes     Comment: occ    Review of Systems Constitutional: No fever/chills.No lightheadedness or syncope. Eyes: No visual changes. ENT: No sore throat. No congestion or rhinorrhea. Cardiovascular: Positive chest pain. Denies palpitations. Respiratory: As it  shortness of breath.  No cough. Gastrointestinal: No abdominal pain.  No nausea, no vomiting.  No diarrhea.  No constipation. Genitourinary: Negative for dysuria. Musculoskeletal: Negative for back pain. No lower extremity sling or calf pain. Skin: Negative for rash.  Neurological: Negative for headaches. No focal numbness, tingling or weakness.  Psychiatric:Panic attack. No hallucinations, as I rechecked.  ____________________________________________   PHYSICAL EXAM:  VITAL SIGNS: ED Triage Vitals  Enc Vitals Group     BP 04/21/17 2035 (!) 162/93     Pulse Rate 04/21/17 2035 78     Resp 04/21/17 2035 (!) 22     Temp 04/21/17 2035 98.8 F (37.1 C)     Temp src --      SpO2 04/21/17 2035 97 %     Weight 04/21/17 2036 170 lb (77.1 kg)     Height 04/21/17 2036 6' (1.829 m)     Head Circumference --      Peak Flow --      Pain Score 04/21/17 2036 8     Pain Loc --      Pain Edu? --      Excl. in GC? --     Constitutional: Alert and oriented. Chronically ill appearing and in no acute distress. Answers questions appropriately. Eyes: Conjunctivae are normal.  EOMI. No scleral icterus. Head: Atraumatic. Scabbed area over the L nostril and just lateral to the nose w/o swelling or purulent discharge. Nose: No congestion/rhinnorhea. Mouth/Throat: Mucous membranes are moist.  Neck: No stridor.  Supple.  No JVD. Cardiovascular: Normal rate, regular rhythm. No murmurs, rubs or gallops.  Respiratory: Normal respiratory effort.  No accessory muscle use or retractions. Lungs CTAB.  No wheezes, rales or ronchi. Gastrointestinal: Soft, nontender and nondistended.  No guarding or rebound.  No peritoneal signs. Musculoskeletal: No LE edema. No ttp in the calves or palpable cords.  Negative Homan's sign. Neurologic:  A&Ox3.  Speech is clear.  Face and smile are symmetric.  EOMI.  Moves all extremities well. Skin:  Skin is warm, dry and intact. No rash noted. Psychiatric: At this time, the  patient has a mildly depressed mood but is not exhibiting any signs or symptoms of severe anxiety. In addition, he denies SI, HI or hallucinations.  ____________________________________________   LABS (all labs ordered are listed, but only abnormal results are displayed)  Labs Reviewed  BASIC METABOLIC PANEL - Abnormal; Notable for the following:       Result Value   Potassium 3.3 (*)    Glucose, Bld 112 (*)    All other components within normal limits  CBC - Abnormal; Notable for the following:    WBC 17.2 (*)    HCT 39.0 (*)    RDW 16.0 (*)    All other components within normal limits  TROPONIN I  TROPONIN I   ____________________________________________  EKG  ED ECG REPORT I, Rockne Menghini, the attending physician, personally viewed and interpreted this ECG.   Date: 04/21/2017  EKG Time: 2033  Rate: 119  Rhythm: sinus tachycardia  Axis: leftward  Intervals:none  ST&T Change:  No STEMI  ____________________________________________  RADIOLOGY  Dg Chest 2 View  Result Date: 04/21/2017 CLINICAL DATA:  Panic attack.  Left-sided chest pain. EXAM: CHEST  2 VIEW COMPARISON:  CT scan February 10, 2017.  Chest x-ray February 10, 2017. FINDINGS: Mild opacity remains in the lateral right lung base. Overall, there has been significant improvement on the right. Opacity remains in the left upper lobe, also significantly improved in the interval. No other interval changes. IMPRESSION: Improving bilateral pulmonary infiltrates. Electronically Signed   By: Gerome Samavid  Williams III M.D   On: 04/21/2017 21:33    ____________________________________________   PROCEDURES  Procedure(s) performed: None  Procedures  Critical Care performed: No ____________________________________________   INITIAL IMPRESSION / ASSESSMENT AND PLAN / ED COURSE  Pertinent labs & imaging results that were available during my care of the patient were reviewed by me and considered in my medical decision  making (see chart for details).  61 y.o. male with a history of anxiety and multiple cardiac and pulmonary risk factors presenting with an episode of chest pain and shortness of breath that occurred after seeing his fundi. Overall, the patient's history and description of his symptoms is most consistent with a panic attack. However, we will undergo a chronic evaluation in the emergency department given his risk factors. At this time, his EKG does not show ischemic changes and he does have a negative troponin. We'll get a second troponin and with a chest x-ray.  ----------------------------------------- 11:26 PM on 04/21/2017 -----------------------------------------  At this time, the patient is feeling significantly better. His chest x-ray shows improving bilateral pulmonary infiltrates, and he states that he has completed his antibiotics at home but does not remember what antibiotics he is taking. I will put him on a Z-Pak. He does have an elevation of his white blood cell count here, but it is chronically elevated and they signed for him. His troponin has been negative twice. The patient is safe for discharge at this time. Return precautions as well as follow-up instructions were discussed ____________________________________________  FINAL CLINICAL IMPRESSION(S) / ED DIAGNOSES  Final diagnoses:  Pneumonia of both lungs due to infectious organism, unspecified part of lung  Panic attack as reaction to stress  Chest pain, unspecified type  Shortness of breath         NEW MEDICATIONS STARTED DURING THIS VISIT:  New Prescriptions   AZITHROMYCIN (ZITHROMAX Z-PAK) 250 MG TABLET    Take 2 tablets (500 mg) on  Day 1,  followed by 1 tablet (250 mg) once daily on Days 2 through 5.      Rockne MenghiniNorman, Anne-Caroline, MD 04/21/17 [phone]

## 2017-04-21 NOTE — ED Notes (Signed)
RN attempted to call both of pts sons. No answer for either number for emergency contact and no answer for other son.

## 2017-04-21 NOTE — ED Notes (Addendum)
Pt requested RN call wife to ask for a ride home. RN was unable to get ahold of wife. Message was left to call the hospital.

## 2017-04-22 NOTE — ED Notes (Signed)
Pt sent home with golden eagle. Pt able to ambulate independently and in NAD at this time.

## 2017-12-18 IMAGING — CT CT CERVICAL SPINE W/O CM
3 of 5 series · 10 of 33 positions shown, 11 images · non-contrast
Comparison: 05/09/2014

CLINICAL DATA: Syncope.  Minimally responsive.  Struck head.

EXAM:
CT HEAD WITHOUT CONTRAST
CT CERVICAL SPINE WITHOUT CONTRAST
TECHNIQUE: Multidetector CT imaging of the head and cervical spine was
performed following the standard protocol without intravenous
contrast. Multiplanar CT image reconstructions of the cervical spine
were also generated.

[Series 8: sagittal bone · sagittal · 0.23mm/px · 5 of 50 slices shown]
[im 17/50  bone]
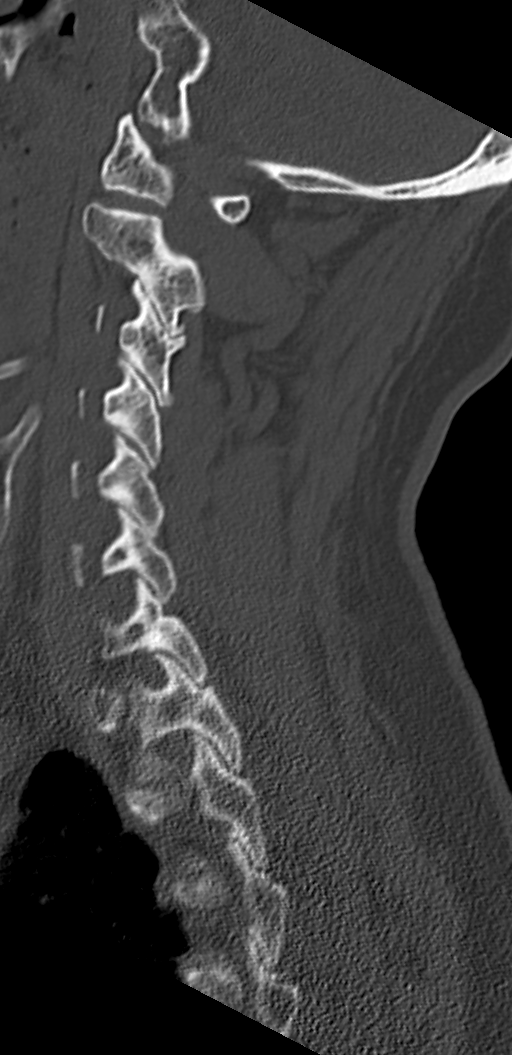
[im 21/50  bone]
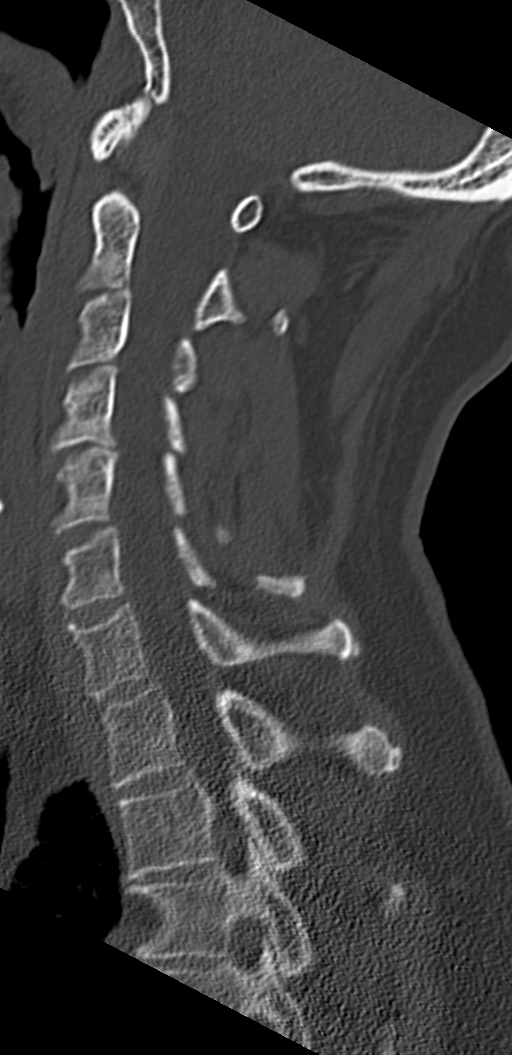
[im 25/50  bone]
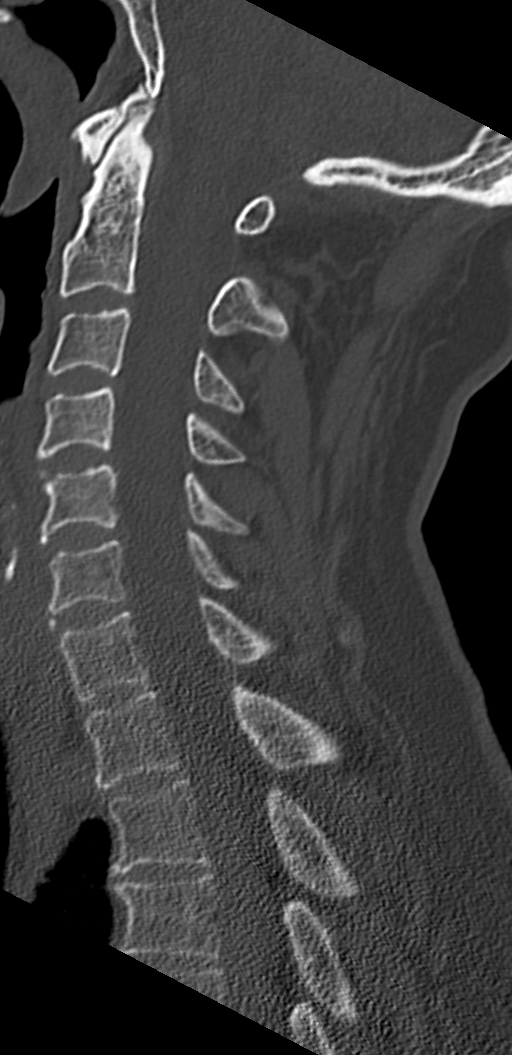
[im 29/50  bone]
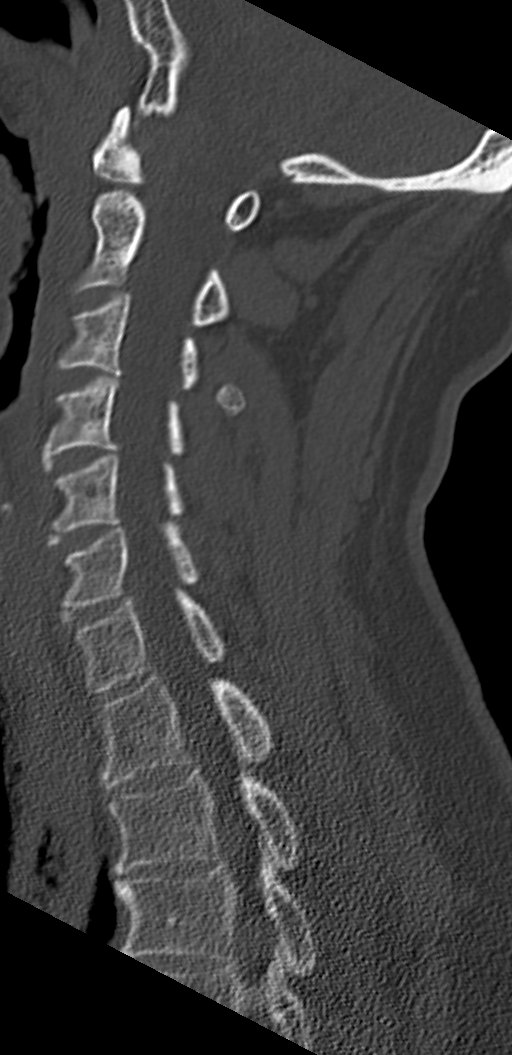
[im 33/50  bone]
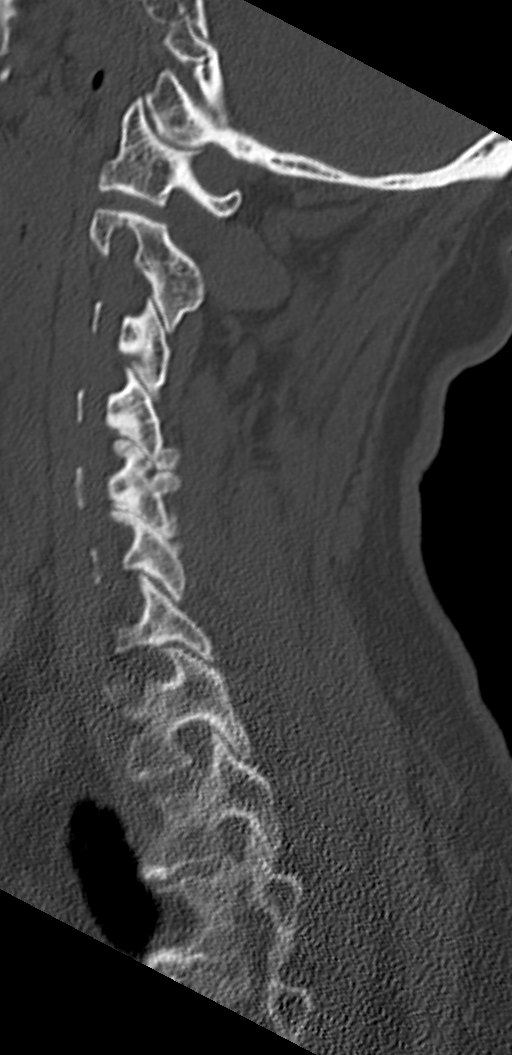

[Series 9: coronal bone · coronal · 0.22mm/px · 3 of 51 slices shown]
[im 11/51  bone]
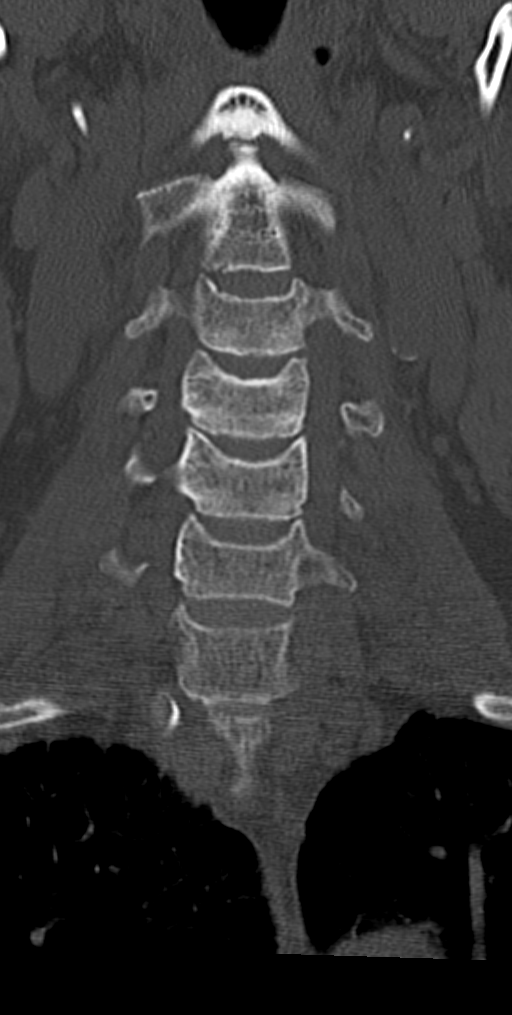
[im 21/51  bone]
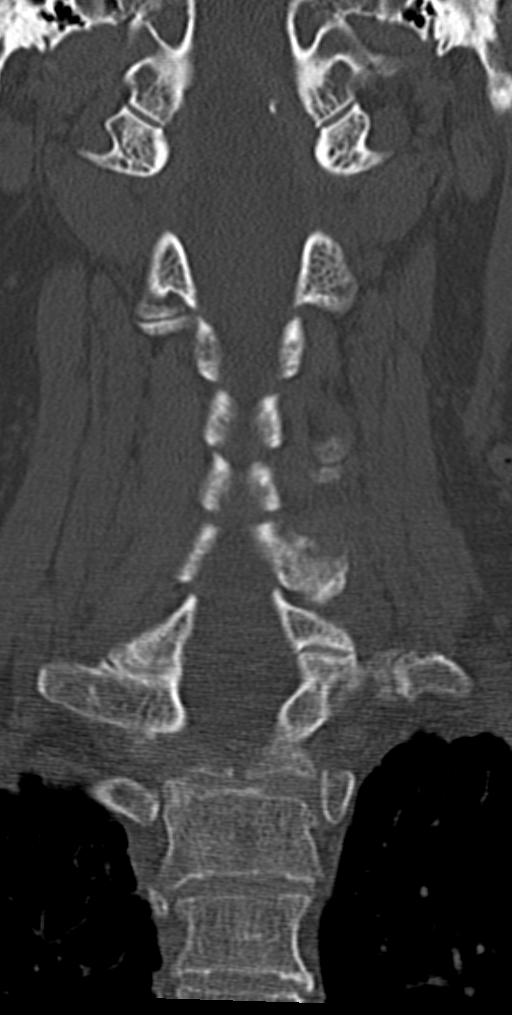
[im 31/51  bone]
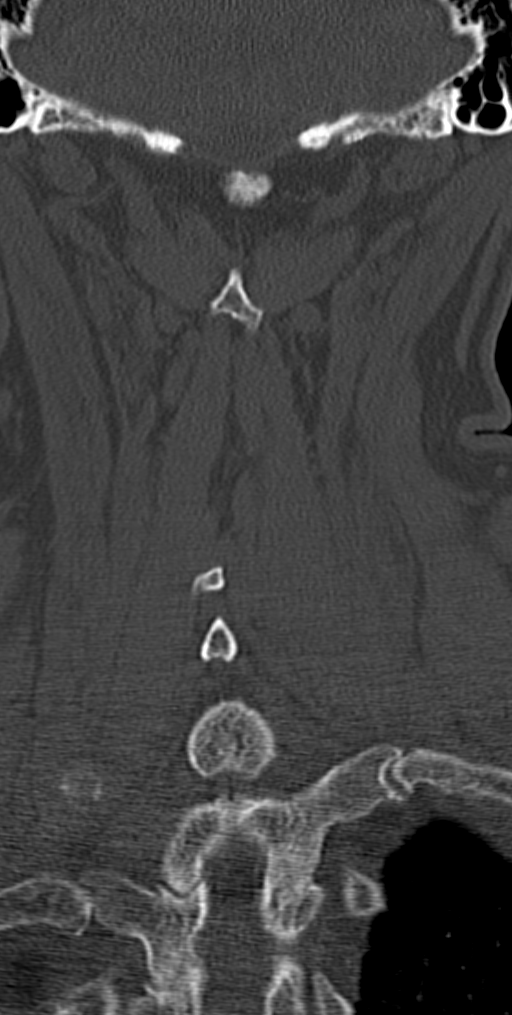

[Series 10: axial · axial · 0.21mm/px · z∈[-250,-176]mm · 2 of 105 slices shown, 3 images]
[im 42/105  soft-tissue]
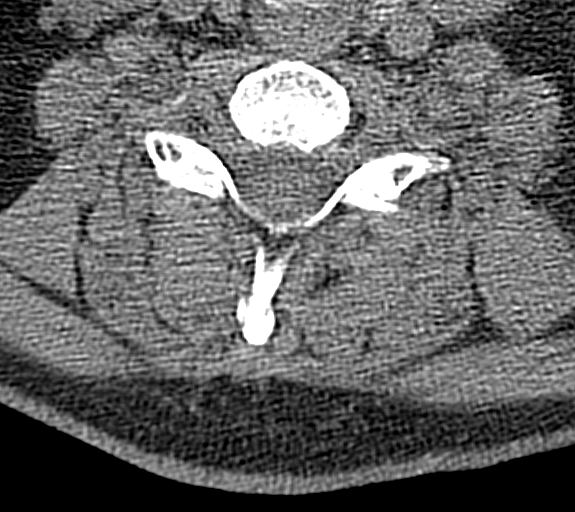
[im 42/105  bone]
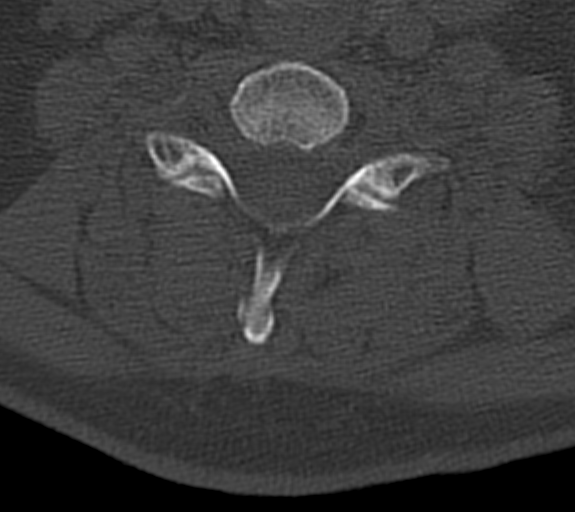
[im 84/105  bone]
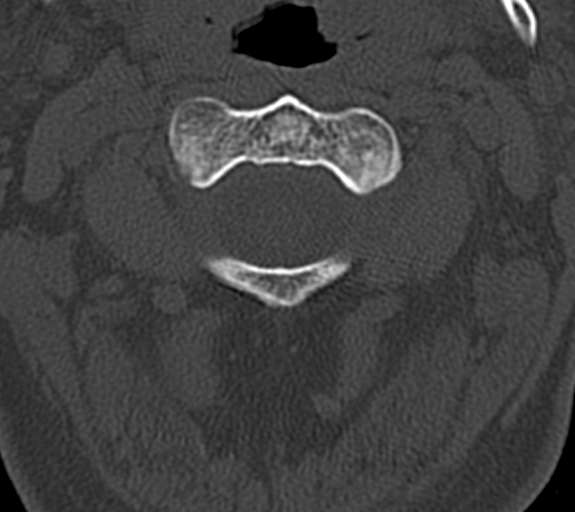

[10 of 33 positions shown; findings below may reference images not displayed]

FINDINGS: CT HEAD FINDINGS

There is low attenuation within the subcortical and periventricular
white matter compatible with chronic microvascular disease.
Prominence of the sulci and ventricles noted consistent with brain
atrophy. No acute intracranial hemorrhage, acute cortical infarct or
mass. No abnormal extra-axial fluid collections noted.
Mucoperiosteal thickening involving the left maxillary sinus noted,
chronic. The mastoid air cells are clear. The calvarium appears
intact.

CT CERVICAL SPINE FINDINGS

Normal alignment of the cervical spine. The vertebral body heights
are well preserved. The prevertebral soft tissue space appears
normal. There is no fracture or subluxation identified. Limited
imaging through the upper chest is significant for changes of
paraseptal emphysema.
IMPRESSION: 1. No acute intracranial abnormalities.
2. Small vessel ischemic change and brain atrophy.
3. No evidence for cervical spine fracture.
4. Emphysema.

## 2017-12-18 IMAGING — CR DG CHEST 1V PORT
1 series · 1 of 1 positions shown · non-contrast
Comparison: Portable exam 9111 hours compared to 05/23/2015

CLINICAL DATA: Lost consciousness today, not very responsive,
unable to answer questions, cough, syncope, hypotension, history
COPD, diabetes mellitus, CHF, MI, smoking

EXAM:
PORTABLE CHEST 1 VIEW

[ap]
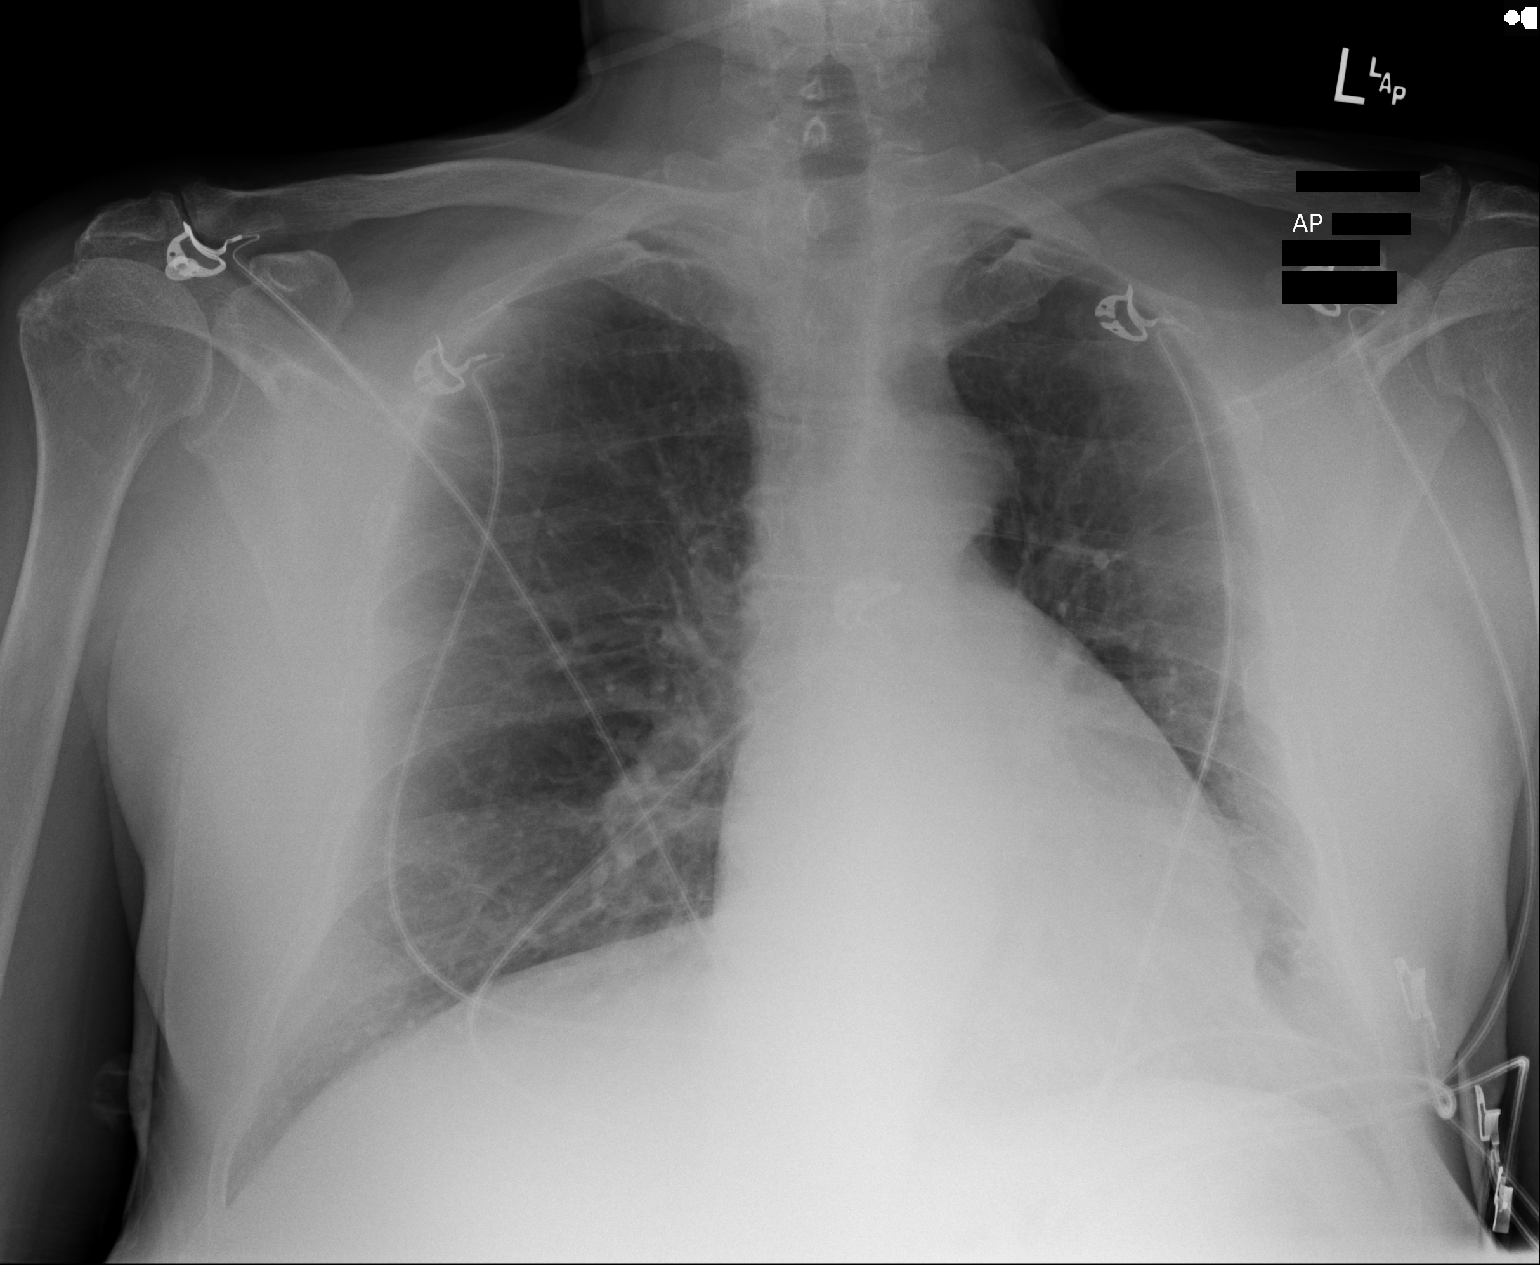

[1 of 1 positions shown; findings below may reference images not displayed]

FINDINGS: Upper normal heart size.

Mediastinal contours and pulmonary vascularity normal.

Lungs clear.

No pleural effusion or pneumothorax.

No acute osseous findings.
IMPRESSION: No definite acute abnormalities.
